# Patient Record
Sex: Female | Born: 1955 | Race: White | Hispanic: No | Marital: Married | Smoking: Former smoker
Health system: Southern US, Community
[De-identification: ages and names within clinical notes are randomized; demographics above are authoritative.]

## PROBLEM LIST (undated history)

## (undated) DIAGNOSIS — F329 Major depressive disorder, single episode, unspecified: Secondary | ICD-10-CM

## (undated) DIAGNOSIS — M899 Disorder of bone, unspecified: Secondary | ICD-10-CM

## (undated) DIAGNOSIS — M1990 Unspecified osteoarthritis, unspecified site: Secondary | ICD-10-CM

## (undated) DIAGNOSIS — J309 Allergic rhinitis, unspecified: Secondary | ICD-10-CM

## (undated) DIAGNOSIS — E119 Type 2 diabetes mellitus without complications: Secondary | ICD-10-CM

## (undated) DIAGNOSIS — I10 Essential (primary) hypertension: Secondary | ICD-10-CM

## (undated) DIAGNOSIS — K649 Unspecified hemorrhoids: Secondary | ICD-10-CM

## (undated) DIAGNOSIS — M949 Disorder of cartilage, unspecified: Secondary | ICD-10-CM

## (undated) DIAGNOSIS — F411 Generalized anxiety disorder: Secondary | ICD-10-CM

## (undated) DIAGNOSIS — G5600 Carpal tunnel syndrome, unspecified upper limb: Secondary | ICD-10-CM

## (undated) DIAGNOSIS — Z794 Long term (current) use of insulin: Secondary | ICD-10-CM

## (undated) DIAGNOSIS — Z87891 Personal history of nicotine dependence: Secondary | ICD-10-CM

## (undated) DIAGNOSIS — E785 Hyperlipidemia, unspecified: Secondary | ICD-10-CM

## (undated) DIAGNOSIS — A6000 Herpesviral infection of urogenital system, unspecified: Secondary | ICD-10-CM

## (undated) DIAGNOSIS — K648 Other hemorrhoids: Secondary | ICD-10-CM

## (undated) DIAGNOSIS — F3289 Other specified depressive episodes: Secondary | ICD-10-CM

## (undated) DIAGNOSIS — M810 Age-related osteoporosis without current pathological fracture: Secondary | ICD-10-CM

## (undated) DIAGNOSIS — Z85820 Personal history of malignant melanoma of skin: Secondary | ICD-10-CM

## (undated) HISTORY — DX: Type 2 diabetes mellitus without complications: E11.9

## (undated) HISTORY — DX: Essential (primary) hypertension: I10

## (undated) HISTORY — PX: OTHER SURGICAL HISTORY: SHX169

## (undated) HISTORY — DX: Disorder of cartilage, unspecified: M94.9

## (undated) HISTORY — DX: Allergic rhinitis, unspecified: J30.9

## (undated) HISTORY — DX: Other hemorrhoids: K64.8

## (undated) HISTORY — PX: MOHS SURGERY: SUR867

## (undated) HISTORY — PX: ANUS SURGERY: SHX302

## (undated) HISTORY — DX: Other specified depressive episodes: F32.89

## (undated) HISTORY — DX: Personal history of nicotine dependence: Z87.891

## (undated) HISTORY — DX: Major depressive disorder, single episode, unspecified: F32.9

## (undated) HISTORY — DX: Herpesviral infection of urogenital system, unspecified: A60.00

## (undated) HISTORY — DX: Generalized anxiety disorder: F41.1

## (undated) HISTORY — DX: Hyperlipidemia, unspecified: E78.5

## (undated) HISTORY — DX: Disorder of bone, unspecified: M89.9

---

## 1981-11-20 HISTORY — PX: LAPAROSCOPIC SALPINGO OOPHERECTOMY: SHX5927

## 1984-11-20 HISTORY — PX: OTHER SURGICAL HISTORY: SHX169

## 2000-10-12 ENCOUNTER — Encounter: Payer: Self-pay | Admitting: Internal Medicine

## 2001-09-24 ENCOUNTER — Encounter: Payer: Self-pay | Admitting: Gastroenterology

## 2001-09-24 DIAGNOSIS — K648 Other hemorrhoids: Secondary | ICD-10-CM

## 2001-09-24 HISTORY — DX: Other hemorrhoids: K64.8

## 2002-11-24 ENCOUNTER — Ambulatory Visit (HOSPITAL_COMMUNITY): Admission: RE | Admit: 2002-11-24 | Discharge: 2002-11-24 | Payer: Self-pay | Admitting: Family Medicine

## 2002-11-24 ENCOUNTER — Encounter: Payer: Self-pay | Admitting: Family Medicine

## 2003-01-15 ENCOUNTER — Ambulatory Visit (HOSPITAL_COMMUNITY): Admission: RE | Admit: 2003-01-15 | Discharge: 2003-01-15 | Payer: Self-pay | Admitting: Family Medicine

## 2003-01-15 ENCOUNTER — Encounter: Payer: Self-pay | Admitting: Family Medicine

## 2003-05-28 ENCOUNTER — Other Ambulatory Visit: Admission: RE | Admit: 2003-05-28 | Discharge: 2003-05-28 | Payer: Self-pay | Admitting: Obstetrics and Gynecology

## 2004-07-04 ENCOUNTER — Other Ambulatory Visit: Admission: RE | Admit: 2004-07-04 | Discharge: 2004-07-04 | Payer: Self-pay | Admitting: Obstetrics and Gynecology

## 2004-09-20 HISTORY — PX: OTHER SURGICAL HISTORY: SHX169

## 2004-09-21 ENCOUNTER — Encounter (INDEPENDENT_AMBULATORY_CARE_PROVIDER_SITE_OTHER): Payer: Self-pay | Admitting: *Deleted

## 2004-09-21 ENCOUNTER — Observation Stay (HOSPITAL_COMMUNITY): Admission: RE | Admit: 2004-09-21 | Discharge: 2004-09-21 | Payer: Self-pay | Admitting: Obstetrics and Gynecology

## 2004-09-21 HISTORY — PX: LAPAROSCOPIC SALPINGOOPHERECTOMY: SUR795

## 2005-05-27 ENCOUNTER — Inpatient Hospital Stay (HOSPITAL_COMMUNITY): Admission: AD | Admit: 2005-05-27 | Discharge: 2005-05-27 | Payer: Self-pay | Admitting: Obstetrics and Gynecology

## 2005-09-08 ENCOUNTER — Other Ambulatory Visit: Admission: RE | Admit: 2005-09-08 | Discharge: 2005-09-08 | Payer: Self-pay | Admitting: Obstetrics and Gynecology

## 2005-09-19 ENCOUNTER — Ambulatory Visit: Payer: Self-pay | Admitting: Gastroenterology

## 2008-10-23 ENCOUNTER — Telehealth: Payer: Self-pay | Admitting: Gastroenterology

## 2008-10-26 DIAGNOSIS — M949 Disorder of cartilage, unspecified: Secondary | ICD-10-CM

## 2008-10-26 DIAGNOSIS — M899 Disorder of bone, unspecified: Secondary | ICD-10-CM | POA: Insufficient documentation

## 2008-10-26 DIAGNOSIS — F419 Anxiety disorder, unspecified: Secondary | ICD-10-CM | POA: Insufficient documentation

## 2008-10-26 DIAGNOSIS — F411 Generalized anxiety disorder: Secondary | ICD-10-CM

## 2008-10-26 DIAGNOSIS — K648 Other hemorrhoids: Secondary | ICD-10-CM | POA: Insufficient documentation

## 2008-10-29 ENCOUNTER — Encounter: Payer: Self-pay | Admitting: Internal Medicine

## 2008-10-29 LAB — CONVERTED CEMR LAB
ALT: 24 units/L
ALT: 37 units/L
AST: 27 units/L
AST: 30 units/L
Albumin: 4.1 g/dL
Alkaline Phosphatase: 43 units/L
Alkaline Phosphatase: 71 units/L
BUN: 17 mg/dL
CO2: 24 meq/L
Calcium: 9.4 mg/dL
Chloride: 105 meq/L
Cholesterol: 151 mg/dL
Cholesterol: 177 mg/dL
Creatinine, Ser: 0.7 mg/dL
Glucose, Bld: 95 mg/dL
HDL: 37 mg/dL
HDL: 38 mg/dL
LDL Cholesterol: 122 mg/dL
LDL Cholesterol: 98 mg/dL
Potassium: 3.9 meq/L
Sodium: 14 meq/L
Total Bilirubin: 0.8 mg/dL
Total Protein: 6.9 g/dL
Triglyceride fasting, serum: 140 mg/dL
Triglyceride fasting, serum: 162 mg/dL

## 2008-11-20 DIAGNOSIS — Z85828 Personal history of other malignant neoplasm of skin: Secondary | ICD-10-CM

## 2008-11-20 HISTORY — DX: Personal history of other malignant neoplasm of skin: Z85.828

## 2008-12-07 ENCOUNTER — Telehealth: Payer: Self-pay | Admitting: Gastroenterology

## 2009-09-15 ENCOUNTER — Encounter: Payer: Self-pay | Admitting: Internal Medicine

## 2009-09-15 LAB — CONVERTED CEMR LAB
ALT: 33 units/L
AST: 31 units/L
Albumin: 4.1 g/dL
Alkaline Phosphatase: 62 units/L
BUN: 14 mg/dL
CO2: 26 meq/L
Calcium: 9.7 mg/dL
Chloride: 105 meq/L
Cholesterol: 156 mg/dL
Creatinine, Ser: 0.6 mg/dL
Glucose, Bld: 104 mg/dL
HDL: 39 mg/dL
LDL Cholesterol: 98 mg/dL
Potassium: 3.9 meq/L
Sodium: 140 meq/L
Total Bilirubin: 0.4 mg/dL
Triglyceride fasting, serum: 119 mg/dL

## 2010-02-10 ENCOUNTER — Encounter: Payer: Self-pay | Admitting: Internal Medicine

## 2010-03-16 ENCOUNTER — Encounter: Payer: Self-pay | Admitting: Internal Medicine

## 2010-03-16 LAB — CONVERTED CEMR LAB
ALT: 35 units/L
AST: 29 units/L
Albumin: 4.2 g/dL
Alkaline Phosphatase: 72 units/L
BUN: 15 mg/dL
CO2: 24 meq/L
Calcium: 9.5 mg/dL
Chloride: 102 meq/L
Cholesterol: 156 mg/dL
Creatinine, Ser: 0.5 mg/dL
Glucose, Bld: 168 mg/dL
HDL: 35 mg/dL
Hgb A1c MFr Bld: 7.7 %
LDL Cholesterol: 9 mg/dL
Potassium: 3.9 meq/L
Sodium: 136 meq/L
Total Bilirubin: 0.5 mg/dL
Triglyceride fasting, serum: 212 mg/dL

## 2010-05-18 ENCOUNTER — Ambulatory Visit: Payer: Self-pay | Admitting: Internal Medicine

## 2010-05-18 DIAGNOSIS — Z87891 Personal history of nicotine dependence: Secondary | ICD-10-CM | POA: Insufficient documentation

## 2010-05-18 DIAGNOSIS — J309 Allergic rhinitis, unspecified: Secondary | ICD-10-CM | POA: Insufficient documentation

## 2010-05-18 DIAGNOSIS — F32A Depression, unspecified: Secondary | ICD-10-CM | POA: Insufficient documentation

## 2010-05-18 DIAGNOSIS — Z9889 Other specified postprocedural states: Secondary | ICD-10-CM | POA: Insufficient documentation

## 2010-05-18 DIAGNOSIS — F329 Major depressive disorder, single episode, unspecified: Secondary | ICD-10-CM

## 2010-05-18 DIAGNOSIS — E785 Hyperlipidemia, unspecified: Secondary | ICD-10-CM | POA: Insufficient documentation

## 2010-05-18 DIAGNOSIS — I10 Essential (primary) hypertension: Secondary | ICD-10-CM | POA: Insufficient documentation

## 2010-05-26 ENCOUNTER — Telehealth (INDEPENDENT_AMBULATORY_CARE_PROVIDER_SITE_OTHER): Payer: Self-pay | Admitting: *Deleted

## 2010-05-31 ENCOUNTER — Encounter: Payer: Self-pay | Admitting: Internal Medicine

## 2010-06-17 ENCOUNTER — Telehealth: Payer: Self-pay | Admitting: Internal Medicine

## 2010-06-20 ENCOUNTER — Encounter (INDEPENDENT_AMBULATORY_CARE_PROVIDER_SITE_OTHER): Payer: Self-pay | Admitting: *Deleted

## 2010-07-13 ENCOUNTER — Telehealth (INDEPENDENT_AMBULATORY_CARE_PROVIDER_SITE_OTHER): Payer: Self-pay | Admitting: *Deleted

## 2010-07-14 ENCOUNTER — Telehealth: Payer: Self-pay | Admitting: Gastroenterology

## 2010-07-14 ENCOUNTER — Telehealth: Payer: Self-pay | Admitting: Physician Assistant

## 2010-08-25 ENCOUNTER — Ambulatory Visit: Payer: Self-pay | Admitting: Internal Medicine

## 2010-08-26 ENCOUNTER — Telehealth: Payer: Self-pay | Admitting: Internal Medicine

## 2010-08-26 LAB — CONVERTED CEMR LAB
ALT: 33 units/L (ref 0–35)
AST: 29 units/L (ref 0–37)
Albumin: 4.1 g/dL (ref 3.5–5.2)
Alkaline Phosphatase: 79 units/L (ref 39–117)
BUN: 17 mg/dL (ref 6–23)
CO2: 28 meq/L (ref 19–32)
Calcium: 9.7 mg/dL (ref 8.4–10.5)
Chloride: 99 meq/L (ref 96–112)
Cholesterol: 150 mg/dL (ref 0–200)
Creatinine, Ser: 0.6 mg/dL (ref 0.4–1.2)
Direct LDL: 85.5 mg/dL
GFR calc non Af Amer: 121.69 mL/min (ref >60)
Glucose, Bld: 183 mg/dL — ABNORMAL HIGH (ref 70–99)
HDL: 30.2 mg/dL — ABNORMAL LOW (ref >39.00)
Potassium: 3.9 meq/L (ref 3.5–5.1)
Sodium: 136 meq/L (ref 135–145)
Total Bilirubin: 0.5 mg/dL (ref 0.3–1.2)
Total CHOL/HDL Ratio: 5
Total Protein: 7.4 g/dL (ref 6.0–8.3)
Triglycerides: 227 mg/dL — ABNORMAL HIGH (ref 0.0–149.0)
VLDL: 45.4 mg/dL — ABNORMAL HIGH (ref 0.0–40.0)

## 2010-08-30 ENCOUNTER — Ambulatory Visit: Payer: Self-pay | Admitting: Internal Medicine

## 2010-08-30 DIAGNOSIS — E119 Type 2 diabetes mellitus without complications: Secondary | ICD-10-CM | POA: Insufficient documentation

## 2010-08-30 LAB — CONVERTED CEMR LAB
Creatinine,U: 86.3 mg/dL
Hgb A1c MFr Bld: 10 % — ABNORMAL HIGH (ref 4.6–6.5)
Microalb Creat Ratio: 0.3 mg/g (ref 0.0–30.0)
Microalb, Ur: 0.3 mg/dL (ref 0.0–1.9)

## 2010-08-31 ENCOUNTER — Telehealth: Payer: Self-pay | Admitting: Internal Medicine

## 2010-09-01 ENCOUNTER — Telehealth: Payer: Self-pay | Admitting: Internal Medicine

## 2010-09-05 ENCOUNTER — Telehealth: Payer: Self-pay | Admitting: Internal Medicine

## 2010-09-08 ENCOUNTER — Telehealth: Payer: Self-pay | Admitting: Internal Medicine

## 2010-09-27 ENCOUNTER — Encounter: Payer: Self-pay | Admitting: Internal Medicine

## 2010-10-10 ENCOUNTER — Ambulatory Visit: Payer: Self-pay | Admitting: Internal Medicine

## 2010-10-20 ENCOUNTER — Encounter: Payer: Self-pay | Admitting: Internal Medicine

## 2010-10-20 ENCOUNTER — Encounter
Admission: RE | Admit: 2010-10-20 | Discharge: 2010-12-20 | Payer: Self-pay | Source: Home / Self Care | Attending: Internal Medicine | Admitting: Internal Medicine

## 2010-10-24 ENCOUNTER — Encounter: Payer: Self-pay | Admitting: Internal Medicine

## 2010-10-25 ENCOUNTER — Telehealth: Payer: Self-pay | Admitting: Internal Medicine

## 2010-11-02 ENCOUNTER — Telehealth: Payer: Self-pay | Admitting: Internal Medicine

## 2010-11-10 ENCOUNTER — Encounter: Payer: Self-pay | Admitting: Internal Medicine

## 2010-11-10 LAB — HM MAMMOGRAPHY

## 2010-11-14 ENCOUNTER — Ambulatory Visit: Payer: Self-pay | Admitting: Internal Medicine

## 2010-11-15 LAB — CONVERTED CEMR LAB: Hgb A1c MFr Bld: 8.2 % — ABNORMAL HIGH (ref 4.6–6.5)

## 2010-11-18 ENCOUNTER — Ambulatory Visit: Payer: Self-pay | Admitting: Internal Medicine

## 2010-11-23 ENCOUNTER — Encounter: Payer: Self-pay | Admitting: Internal Medicine

## 2010-11-24 ENCOUNTER — Encounter: Payer: Self-pay | Admitting: Internal Medicine

## 2010-12-07 ENCOUNTER — Encounter: Payer: Self-pay | Admitting: Gastroenterology

## 2010-12-13 ENCOUNTER — Telehealth: Payer: Self-pay | Admitting: Internal Medicine

## 2010-12-22 NOTE — Assessment & Plan Note (Signed)
Summary: PER PT 3 MTH FU AND PT D/T INSTEAD OF JAN-STC   Vital Signs:  Patient profile:   55 year old female Height:      64.5 inches (163.83 cm) Weight:      221 pounds (100.45 kg) BMI:     37.48 O2 Sat:      97 % on Room air Temp:     97.7 degrees F (36.50 degrees C) oral Pulse rate:   85 / minute BP sitting:   102 / 64  (left arm) Cuff size:   large  Vitals Entered By: Margaret Pyle, CMA (November 18, 2010 1:25 PM)  O2 Flow:  Room air CC: F/U discuss weight Is Patient Diabetic? Yes   Primary Care Provider:  Rene Paci MD  CC:  F/U discuss weight.  History of Present Illness: here for f/u  1) dyslipidemia - reports compliance with ongoing medical treatment and no changes in medication dose or frequency. denies adverse side effects related to current therapy. no GI or Mskel problems-  2) DM2: started metformin 08/2010 for new dx ("borderline" prior), strong +FH same - s/p meeting with nutritionist through work program re: weight loss mgmt and diet control of same - home cbgs check 2x/d  3) HTN - reports compliance with ongoing medical treatment and no changes in medication dose or frequency. denies adverse side effects related to current therapy. no CP or edema or HA or vision changes  4) depression - hx same - well controlled symptoms on current tx - reports compliance with ongoing medical treatment and no changes in medication dose or frequency. denies adverse side effects related to current therapy.   Clinical Review Panels:  Lipid Management   Cholesterol:  150 (08/25/2010)   LDL (bad choesterol):  9 (16/08/9603)   HDL (good cholesterol):  30.20 (08/25/2010)   Triglycerides:  212 (03/16/2010)  Diabetes Management   HgBA1C:  8.2 (11/15/2010)   Creatinine:  0.6 (08/25/2010)   Last Dilated Eye Exam:  normal OD 20/25 and OS 20/20 (10/24/2010)   Last Flu Vaccine:  Historical (08/20/2009)  Complete Metabolic Panel   Glucose:  183  (08/25/2010)   Sodium:  136 (08/25/2010)   Potassium:  3.9 (08/25/2010)   Chloride:  99 (08/25/2010)   CO2:  28 (08/25/2010)   BUN:  17 (08/25/2010)   Creatinine:  0.6 (08/25/2010)   Albumin:  4.1 (08/25/2010)   Total Protein:  7.4 (08/25/2010)   Calcium:  9.7 (08/25/2010)   Total Bili:  0.5 (08/25/2010)   Alk Phos:  79 (08/25/2010)   SGPT (ALT):  33 (08/25/2010)   SGOT (AST):  29 (08/25/2010)   Current Medications (verified): 1)  Triamterene-Hctz 37.5-25 Mg Tabs (Triamterene-Hctz) .... Take 1 By Mouth Once Daily 2)  Zoloft 100 Mg Tabs (Sertraline Hcl) .... Take 1 By Mouth Once Daily 3)  Tricor 145 Mg Tabs (Fenofibrate) .... Take 1 By Mouth Once Daily 4)  Simvastatin 20 Mg Tabs (Simvastatin) .... Take 1 Po Once Daily 5)  Clonazepam 1 Mg Tabs (Clonazepam) .... Take 1-2 By Mouth Once Daily As Needed 6)  Omega-3 350 Mg Caps (Omega-3 Fatty Acids) .... Take 1 By Mouth Once Daily 7)  Caltrate 600+d 600-400 Mg-Unit Tabs (Calcium Carbonate-Vitamin D) .... Take 1 By Mouth Once Daily 8)  Metformin Hcl 500 Mg Xr24h-Tab (Metformin Hcl) .[name].. 1 By Mouth Two Times A Day 9)  Fluconazole 150 Mg Tabs (Fluconazole) .[name].. 1 By Mouth Once Daily X 3 Days, Repeat If Needed For  Yeast Symptoms 10)  Onetouch Ultra 2 W/device Kit (Blood Glucose Monitoring Suppl) 11)  Onetouch Ultra Blue  Strp (Glucose Blood) .... Use As Directed Three Times A Day 12)  Onetouch Delica Lancets  Misc (Lancets) .... Use As Directed Three Times A Day 13)  Colace 100 Mg Caps (Docusate Sodium) .... Once Daily 14)  Anusol-Hc 25 Mg Supp (Hydrocortisone Acetate) .... Insert 1 Suppository Into Rectum At Bedtime 15)  Citrucel 500 Mg Tabs (Methylcellulose (Laxative)) .... As Directed  Allergies (verified): 1)  ! Codeine  Past History:  Past Medical History: ANXIETY DEPRESSION OSTEOPENIA HEMORRHOIDS, INTERNAL  Allergic rhinitis Hyperlipidemia Hypertension diabetes mellitus type 2  MD roster:   GI - brodie gyn - tomblin derm  - gruber/gould  Review of Systems       The patient complains of weight gain.  The patient denies anorexia, weight loss, chest pain, headaches, and abdominal pain.    Physical Exam  General:  overweight-appearing.  alert, well-developed, well-nourished, and cooperative to examination.    Lungs:  normal respiratory effort, no intercostal retractions or use of accessory muscles; normal breath sounds bilaterally - no crackles and no wheezes.    Heart:  normal rate, regular rhythm, no murmur, and no rub. BLE without edema.    Impression & Recommendations:  Problem # 1:  DIABETES MELLITUS, TYPE II (ICD-250.00)  dx 08/2010 - started metformin for same - improved but not yet at goal - reviewed diet goals and need for exercise start victoza - pt educated on same - sample provided and erx done Her updated medication list for this problem includes:    Metformin Hcl 500 Mg Xr24h-tab (Metformin hcl) .[name]... 1 by mouth two times a day    Victoza 18 Mg/68ml Soln (Liraglutide) .[name]... 1.2mg  subcutaneously once daily  Labs Reviewed: Creat: 0.6 (08/25/2010)     Last Eye Exam: normal OD 20/25 and OS 20/20 (10/24/2010) Reviewed HgBA1c results: 8.2 (11/15/2010)  10.0 (08/30/2010)  Orders: Prescription Created Electronically [phone])  Complete Medication List: 1)  Triamterene-hctz 37.5-25 Mg Tabs (Triamterene-hctz) .... Take 1 by mouth once daily 2)  Zoloft 100 Mg Tabs (Sertraline hcl) .... Take 1 by mouth once daily 3)  Tricor 145 Mg Tabs (Fenofibrate) .... Take 1 by mouth once daily 4)  Simvastatin 20 Mg Tabs (Simvastatin) .... Take 1 po once daily 5)  Clonazepam 1 Mg Tabs (Clonazepam) .... Take 1-2 by mouth once daily as needed 6)  Omega-3 350 Mg Caps (Omega-3 fatty acids) .... Take 1 by mouth once daily 7)  Caltrate 600+d 600-400 Mg-unit Tabs (Calcium carbonate-vitamin d) .... Take 1 by mouth once daily 8)  Metformin Hcl 500 Mg Xr24h-tab (Metformin hcl) .[name].. 1 by mouth two times a day 9)   Fluconazole 150 Mg Tabs (Fluconazole) .[name].. 1 by mouth once daily x 3 days, repeat if needed for yeast symptoms 10)  Onetouch Ultra 2 W/device Kit (Blood glucose monitoring suppl) 11)  Onetouch Ultra Blue Strp (Glucose blood) .... Use as directed three times a day 12)  Onetouch Delica Lancets Misc (Lancets) .... Use as directed three times a day 13)  Colace 100 Mg Caps (Docusate sodium) .... Once daily 14)  Anusol-hc 25 Mg Supp (Hydrocortisone acetate) .... Insert 1 suppository into rectum at bedtime 15)  Citrucel 500 Mg Tabs (Methylcellulose (laxative)) .... As directed 16)  Victoza 18 Mg/35ml Soln (Liraglutide) .... 1.2mg  subcutaneously once daily 17)  Novotwist 32g X 5 Mm Misc (Insulin pen needle) .... Once daily with victoza  Patient  Instructions: 1)  it was good to see you today. 2)  a1c improved but not yet at goal--- 8.2, prev 10.0 08/2010 3)  start vicotza as discussed - sample and information starter pack give+ prescription has been electronically submitted to your pharmacy. Please take as directed. Contact our office if you believe you're having problems with the medication(s).  4)  continue metformin and other medications as ongoing, no other changes 5)  continue to work with Marylu Lund and the nutritionist as ongoing 6)  Please schedule a follow-up appointment in 3 months for labs, sooner if problems.  Prescriptions: NOVOTWIST 32G X 5 MM MISC (INSULIN PEN NEEDLE) once daily with victoza  #1 box x 3   Entered and Authorized by:   Newt Lukes MD   Signed by:   Newt Lukes MD on 11/18/2010   Method used:   Electronically to        [facility]* (retail)       [address]       [address] Shipping/mailing       [place], [place]  [address]       Ph: [phone]       Fax: [phone]   RxID:   [id number] SWFUXNA 18 MG/3ML SOLN (LIRAGLUTIDE) 1.2mg  subcutaneously once daily  #1 x 3   Entered and Authorized by:   Newt Lukes MD   Signed by:    Newt Lukes MD on 11/18/2010   Method used:   Electronically to        [facility]* (retail)       [address]       [address] Shipping/mailing       [place], [place]  [address]       Ph: [phone]       Fax: [phone]   RxID:   [id number]    Orders Added: 1)  Est. Patient Level IV [54627] 2)  Prescription Created Electronically [phone]

## 2010-12-22 NOTE — Miscellaneous (Signed)
Summary: formulary change to byetta  Clinical Lists Changes  Medications: Changed medication from VICTOZA 18 MG/3ML SOLN (LIRAGLUTIDE) 1.2mg  subcutaneously once daily to BYETTA 5 MCG PEN 5 MCG/0.02ML SOLN (EXENATIDE) subcutaneously two times a day BEFORE meals - Signed Changed medication from NOVOTWIST 32G X 5 MM MISC (INSULIN PEN NEEDLE) once daily with victoza to NOVOTWIST 32G X 5 MM MISC (INSULIN PEN NEEDLE) two times a day with byetta - Signed Rx of BYETTA 5 MCG PEN 5 MCG/0.02ML SOLN (EXENATIDE) subcutaneously two times a day BEFORE meals;  #1 mo x 3;  Signed;  Entered by: Newt Lukes MD;  Authorized by: Newt Lukes MD;  Method used: Electronically to [facility]*, [address] Shipping/mailing, [place], [place]  [address], Ph: [phone], Fax: [phone]    Prescriptions: BYETTA 5 MCG PEN 5 MCG/0.02ML SOLN (EXENATIDE) subcutaneously two times a day BEFORE meals  #1 mo x 3   Entered and Authorized by:   Newt Lukes MD   Signed by:   Newt Lukes MD on 11/24/2010   Method used:   Electronically to        [facility]* (retail)       [address]       [address] Shipping/mailing       [place], [place]  [address]       Ph: [phone]       Fax: [phone]   RxID:   [id number]  Pt advised via VM. Margaret Pyle, CMA  November 24, 2010 8:53 AM

## 2010-12-22 NOTE — Progress Notes (Signed)
Summary: FYI -a1c could not be added on labs  Phone Note From Pharmacy   Caller: Dennice/ lab ext [phone] Request: Speak with Nurse Details for Reason: FYI Summary of Call: Recieved add-on slip for A1c but pt did not have a lavender tube so they are not able to add on the A1C.  Initial call taken by: Orlan Leavens RMA,  August 26, 2010 4:35 PM  Follow-up for Phone Call        ok - will draw at [facility] next week - thanks Follow-up by: Newt Lukes MD,  August 26, 2010 5:02 PM

## 2010-12-22 NOTE — Progress Notes (Signed)
  Phone Note Refill Request Message from:  Patient on August 31, 2010 11:18 AM  Refills Requested: Medication #1:  CLONAZEPAM 1 MG TABS take 1-2 by mouth as needed   Notes: [facility]  Method Requested: Electronic Initial call taken by: Margaret Pyle, CMA,  August 31, 2010 11:19 AM  Follow-up for Phone Call        [place] controlled subst reviewed - pt filled #180 on 12/29/09 - ok to rx same (#180, no refills) - thanks Follow-up by: Newt Lukes MD,  August 31, 2010 11:34 AM    New/Updated Medications: CLONAZEPAM 1 MG TABS (CLONAZEPAM) take 1-2 by mouth once daily as needed Prescriptions: CLONAZEPAM 1 MG TABS (CLONAZEPAM) take 1-2 by mouth once daily as needed  #180 x 0   Entered and Authorized by:   Margaret Pyle, CMA   Signed by:   Margaret Pyle, CMA on 08/31/2010   Method used:   Telephoned to ...       [facility]* (retail)       [address]       [address] Shipping/mailing       [place], [place]  [address]       Ph: [phone]       Fax: [phone]   RxID:   [id number] CLONAZEPAM 1 MG TABS (CLONAZEPAM) take 1-2 by mouth once daily as needed  #180 x 0   Entered and Authorized by:   Newt Lukes MD   Signed by:   Newt Lukes MD on 08/31/2010   Method used:   Historical   RxID:   [id number]

## 2010-12-22 NOTE — Letter (Signed)
Summary: Colonoscopy Letter  [facility]  [address] [place], [place] [address]   Phone: [phone]  Fax: [phone]      December 07, 2010 MRN: [id number]   Destiny Tucker [address] [place], [place]  [address]   Dear Ms. Destiny Tucker,   According to your medical record, it is time for you to schedule a Colonoscopy. The American Cancer Society recommends this procedure as a method to detect early colon cancer. Patients with a family history of colon cancer, or a personal history of colon polyps or inflammatory bowel disease are at increased risk.  This letter has been generated based on the recommendations made at the time of your procedure. If you feel that in your particular situation this may no longer apply, please contact our office.  Please call our office at [phone] to schedule this appointment or to update your records at your earliest convenience.  Thank you for cooperating with [place] to provide you with the very best care possible.   Sincerely,  Judie Petit T. Russella Dar, M.D.  [facility] [phone]

## 2010-12-22 NOTE — Progress Notes (Signed)
Summary: CBG  Phone Note Call from Patient Call back at Home Phone [phone]   Caller: Patient Summary of Call: Pt called stating that she checked her CBGs and is was at 61. Pt denies and sxs but has eaten a banana. Pt wants to be advise of ranges, extreme highs and lows. Please advise. Initial call taken by: Margaret Pyle, CMA,  September 05, 2010 4:44 PM  Follow-up for Phone Call        Per MD: Range 70-120 call if over 150 if under 60. Pt informed via VM Follow-up by: Margaret Pyle, CMA,  September 05, 2010 4:53 PM

## 2010-12-22 NOTE — Progress Notes (Signed)
Summary: Sooner appt  Phone Note From Other Clinic   Caller: Stanton Kidney [phone]  @ Dr Felicity Coyer Call For: Dr Russella Dar Reason for Call: Schedule Patient Appt Summary of Call: Recurring hemorroids feels that she can't wait until appt on 08-17-10. Would like to be worked in with PA sooner. Initial call taken by: Leanor Kail [facility],  July 14, 2010 8:23 AM  Follow-up for Phone Call        patient scheduled for today to see Mike Gip PA at 2:00 Follow-up by: Darcey Nora RN, CGRN,  July 14, 2010 8:36 AM

## 2010-12-22 NOTE — Letter (Signed)
Summary: [facility]  [facility]   Imported By: Lester Gardner 06/03/2010 08:17:44  _____________________________________________________________________  External Attachment:    Type:   Image     Comment:   External Document

## 2010-12-22 NOTE — Progress Notes (Signed)
Summary: Rx req  Phone Note Refill Request Message from:  Patient on September 05, 2010 11:08 AM     New/Updated Medications: ONETOUCH ULTRA BLUE  STRP (GLUCOSE BLOOD) use as directed three times a day ONETOUCH DELICA LANCETS  MISC (LANCETS) use as directed three times a day Prescriptions: ONETOUCH DELICA LANCETS  MISC (LANCETS) use as directed three times a day  #300 x 3   Entered and Authorized by:   Margaret Pyle, CMA   Signed by:   Margaret Pyle, CMA on 09/05/2010   Method used:   Telephoned to ...       [facility]* (retail)       [address]       [address] Shipping/mailing       [place], [place]  [address]       Ph: [phone]       Fax: [phone]   RxID:   [id number] ONETOUCH ULTRA BLUE  STRP (GLUCOSE BLOOD) use as directed three times a day  #300 x 3   Entered and Authorized by:   Margaret Pyle, CMA   Signed by:   Margaret Pyle, CMA on 09/05/2010   Method used:   Telephoned to ...       [facility]* (retail)       [address]       [address] Shipping/mailing       [place], [place]  [address]       Ph: [phone]       Fax: [phone]   RxID:   [id number]

## 2010-12-22 NOTE — Letter (Signed)
Summary: [facility]  [facility]   Imported By: Sherian Rein 10/27/2010 09:40:03  _____________________________________________________________________  External Attachment:    Type:   Image     Comment:   External Document

## 2010-12-22 NOTE — Progress Notes (Signed)
  Phone Note Call from Patient   Summary of Call: Patient left vm. She has scheduled return visit for end of December b/c she needed to get office visit in before the end of the year. She was told to leave a vm on triage to make sure this was ok. I left pt vm that december apt was ok to keep.  Initial call taken by: Lamar Sprinkles, CMA,  September 08, 2010 4:23 PM

## 2010-12-22 NOTE — Progress Notes (Signed)
Summary: Destiny Tucker  Phone Note Call from Patient   Caller: Patient Call For: Amy Esterwood Summary of Call: Pt called back, said she cannot make appt this afternoon, wants another day soon.  Thanks Initial call taken by: Misty Stanley,  July 14, 2010 10:41 AM  Follow-up for Phone Call        patient is rescheduled for an appointment with Dr Russella Dar for Monday at 11:00 Follow-up by: Darcey Nora RN, CGRN,  July 14, 2010 11:51 AM     Appended Document: Destiny Tucker Patient  has actually requested a switch from Dr Russella Dar to Dr Juanda Chance in January, so patient has been rescheduled for 07/29/10 10:45 with Dr Juanda Chance.  Patient  has been offered appointments with the extenders for today, Fri, or Tuesday of next week.  She has declined those appts.

## 2010-12-22 NOTE — Progress Notes (Signed)
Summary: Medication  Phone Note Call from Patient Call back at Home Phone [phone]   Caller: Patient Call For: Dr. Juanda Chance Reason for Call: Talk to Nurse Summary of Call: Needs her script for [facility] 25 MG SUPP resent to [facility] pharmacy Initial call taken by: Karna Christmas,  October 25, 2010 3:53 PM  Follow-up for Phone Call        prescription resent. Follow-up by: Lamona Curl CMA Duncan Dull),  October 25, 2010 3:56 PM    Prescriptions: ANUSOL-HC 25 MG SUPP (HYDROCORTISONE ACETATE) Insert 1 suppository into rectum at bedtime  #12 x 1   Entered by:   Lamona Curl CMA (AAMA)   Authorized by:   Hart Carwin MD   Signed by:   Lamona Curl CMA (AAMA) on 10/25/2010   Method used:   Electronically to        [facility]* (retail)       [address]       [address] Shipping/mailing       [place], [place]  [address]       Ph: [phone]       Fax: [phone]   RxID:   [id number]

## 2010-12-22 NOTE — Assessment & Plan Note (Signed)
Summary: hemorroids--ch.   History of Present Illness Visit Type: Initial Consult Primary GI MD: Lina Sar MD Primary Provider: Rene Paci MD Requesting Provider: Rene Paci, MD Chief Complaint: hemmorhoids/ fissure, constipation x 1 year History of Present Illness:   This is a 55 year old white female with rectal irritation, incomplete evacuation and lower abdominal discomfort. Her symptoms seem to improve when she eats fruits and vegetables and stays on a high fiber diet. We  did a colonoscopy in 2002 which showed internal hemorrhoids. She had Hemoccult-positive stools, acutually 2 out of 3cards  at that time. She was evaluated for constipation in 2006. At her job, she sits at the computer and does not get much exercise. She has tried preparation H. and over-the-counter  creams as well as rectal suppositories she denies rectal bleeding.   GI Review of Systems    Reports abdominal pain, belching, and  bloating.     Location of  Abdominal pain: lower abdomen.    Denies acid reflux, chest pain, dysphagia with liquids, dysphagia with solids, heartburn, loss of appetite, nausea, vomiting, vomiting blood, weight loss, and  weight gain.      Reports change in bowel habits, constipation, hemorrhoids, and  rectal pain.     Denies anal fissure, black tarry stools, diarrhea, diverticulosis, fecal incontinence, heme positive stool, irritable bowel syndrome, jaundice, light color stool, liver problems, and  rectal bleeding.    Current Medications (verified): 1)  Triamterene-Hctz 37.5-25 Mg Tabs (Triamterene-Hctz) .... Take 1 By Mouth Once Daily 2)  Zoloft 100 Mg Tabs (Sertraline Hcl) .... Take 1 By Mouth Once Daily 3)  Tricor 145 Mg Tabs (Fenofibrate) .... Take 1 By Mouth Once Daily 4)  Simvastatin 20 Mg Tabs (Simvastatin) .... Take 1 Po Once Daily 5)  Clonazepam 1 Mg Tabs (Clonazepam) .... Take 1-2 By Mouth Once Daily As Needed 6)  Omega-3 350 Mg Caps (Omega-3 Fatty Acids) .... Take 1  By Mouth Once Daily 7)  Caltrate 600+d 600-400 Mg-Unit Tabs (Calcium Carbonate-Vitamin D) .... Take 1 By Mouth Once Daily 8)  Metformin Hcl 500 Mg Xr24h-Tab (Metformin Hcl) .[name].. 1 By Mouth Two Times A Day 9)  Fluconazole 150 Mg Tabs (Fluconazole) .[name].. 1 By Mouth Once Daily X 3 Days, Repeat If Needed For Yeast Symptoms 10)  Onetouch Ultra 2 W/device Kit (Blood Glucose Monitoring Suppl) 11)  Onetouch Ultra Blue  Strp (Glucose Blood) .... Use As Directed Three Times A Day 12)  Onetouch Delica Lancets  Misc (Lancets) .... Use As Directed Three Times A Day 13)  Colace 100 Mg Caps (Docusate Sodium) .... Once Daily  Allergies (verified): 1)  ! Codeine  Past History:  Past Medical History: Reviewed history from 08/30/2010 and no changes required. ANXIETY DEPRESSION OSTEOPENIA HEMORRHOIDS, INTERNAL  Allergic rhinitis Hyperlipidemia Hypertension diabetes mellitus type 2  MD roster:   GI - stark (?Lucresha Dismuke) gyn - tomblin derm - gruber/gould  Past Surgical History: Unilateral salpingo-oophorectomy Mohs Surgery for precancerous skin lesion Removal of fallopion tube & ovary (1986) Removal of cyst & other ovary 09/2004  Anal surgery Hysterectomy  Family History: Reviewed history from 05/18/2010 and no changes required. No FH of Colon Cancer: Family History of Diabetes: Maternal Uncles Family History of Heart Disease:  Family History High cholesterol (parent) Family History Hypertension (parent) Kidnet stones (both parent) Mother has alzheimers    Social History: Reviewed history from 05/18/2010 and no changes required. 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  The patient denies  anemia, anxiety-new, back pain, blood in urine, breast changes/lumps, change in vision, confusion, coughing up blood, depression-new, fainting, fatigue, fever, headaches-new, hearing problems, heart murmur, heart rhythm changes, menstrual pain, muscle pains/cramps, nosebleeds, pregnancy symptoms, shortness of breath, skin rash, sleeping problems, sore throat, swollen lymph glands, thirst - excessive, urination - excessive, urination changes/pain, vision changes, and voice change.         Pertinent positive and negative review of systems were noted in the above HPI. All other ROS was otherwise negative.   Vital Signs:  Patient profile:   55 year old female Height:      64.5 inches Weight:      216.25 pounds BMI:     36.68 Pulse rate:   68 / minute Pulse rhythm:   regular BP sitting:   110 / 68  (left arm) Cuff size:   regular  Vitals Entered By: June McMurray CMA Duncan Dull) (October 10, 2010 1:38 PM)  Physical Exam  General:  Well developed, well nourished, no acute distress. Mouth:  No deformity or lesions, dentition normal. Neck:  Supple; no masses or thyromegaly. Lungs:  Clear throughout to auscultation. Heart:  Regular rate and rhythm; no murmurs, rubs,  or bruits. Abdomen:  obese soft abdomen with normoactive bowel sounds. No tenderness or mass. Lower abdomen unremarkable. Liver edge at costal margin. Rectal:  rectal and anoscopic exam reveals an external hemorrhoidal tag. Normal rectal sphincter tone with mild discomfort on insertion of the anoscope. Small internal hemorrhoids. No prolapse. Stool is Hemoccult positive. Digital exam does not reveal any irregularity of the mucosa. Extremities:  No clubbing, cyanosis, edema or deformities noted. Skin:  Intact without significant lesions or rashes. Psych:  Alert and cooperative. Normal mood and affect.   Impression & Recommendations:  Problem # 1:  HEMORRHOIDS, INTERNAL (ICD-455.0) Patient has symptomatic external and internal  hemorrhoids without prolapse. Some of her symptoms suggest irritable bowel syndrome. I have advised a high-fiber diet with fiber supplements daily and have given her an outline of a high-fiber diet. She will take a probiotic daily and Benefiber or  Metamucil, one heaping teaspoon daily. She needs will need a colonoscopy because of heme-positive stool.  Patient Instructions: 1)  Please pick up your prescriptions at the pharmacy. Electronic prescription(s) has already been sent for Anusol Suppositories. 2)  We have given you some Align samples which you should take 1 capsule daily of. If it works well for you, it can be purchased over the counter. 3)  Please take Metamucil 1 teaspoon dissolved in water/juice once daily. 4)  You will be due for a recall colonoscopy in January 2012. We will send you a reminder in the mail. 5)  High Fiber Healthy Eating Plan brochure given.  6)  Copy sent to : Dr Willey Blade 7)  The medication list was reviewed and reconciled.  All changed / newly prescribed medications were explained.  A complete medication list was provided to the patient / caregiver. Prescriptions: ANUSOL-HC 25 MG SUPP (HYDROCORTISONE ACETATE) Insert 1 suppository into rectum at bedtime  #12 x 1   Entered by:   Lamona Curl CMA (AAMA)   Authorized by:   Hart Carwin MD   Signed by:   Lamona Curl CMA (AAMA) on 10/10/2010  Method used:   Electronically to        [facility]* (retail)       [address]       [address] Shipping/mailing       [place], [place]  [address]       Ph: [phone]       Fax: [phone]   RxID:   [id number]

## 2010-12-22 NOTE — Progress Notes (Signed)
Summary: med change  Phone Note From Pharmacy   Caller: [facility]* Reason for Call: Medication not on formulary Request: Speak with Provider Summary of Call: Recieved fax stating pt has rx for Tricor 145mg . This is not covered under her insurance. can we switch to fenofibrate 134mg ? # 90. Pt can get fenofibrate $0 at our pharmacy. (other strength 54mg ,67mg ,160mg ,200mg  all $0 copay). Pls advise Initial call taken by: Orlan Leavens RMA,  December 13, 2010 9:22 AM  Follow-up for Phone Call        yes, ok to change - erx done Follow-up by: Newt Lukes MD,  December 13, 2010 12:11 PM  Additional Follow-up for Phone Call Additional follow up Details #1::        Notified pharmacy Additional Follow-up by: Orlan Leavens RMA,  December 13, 2010 2:28 PM    New/Updated Medications: FENOFIBRATE MICRONIZED 134 MG CAPS (FENOFIBRATE MICRONIZED) 1 by mouth once daily Prescriptions: FENOFIBRATE MICRONIZED 134 MG CAPS (FENOFIBRATE MICRONIZED) 1 by mouth once daily  #90 x 1   Entered and Authorized by:   Newt Lukes MD   Signed by:   Newt Lukes MD on 12/13/2010   Method used:   Electronically to        [facility]* (retail)       [address]       [address] Shipping/mailing       [place], [place]  [address]       Ph: [phone]       Fax: [phone]   RxID:   [id number]

## 2010-12-22 NOTE — Assessment & Plan Note (Signed)
Summary: PER PT OCT APPT---STC   Vital Signs:  Patient profile:   55 year old female Height:      64.5 inches (163.83 cm) Weight:      222.0 pounds (100.91 kg) O2 Sat:      94 % on Room air Temp:     98.4 degrees F (36.89 degrees C) oral Pulse rate:   92 / minute BP sitting:   110 / 72  (left arm) Cuff size:   large  Vitals Entered By: Orlan Leavens RMA (August 30, 2010 1:10 PM)  O2 Flow:  Room air CC: 4 month follow-up Is Patient Diabetic? Yes Did you bring your meter with you today? No Pain Assessment Patient in pain? no        Primary Care Provider:  Rene Paci MD  CC:  4 month follow-up.  History of Present Illness: here for f/u  1) dyslipidemia - reports compliance with ongoing medical treatment and no changes in medication dose or frequency. denies adverse side effects related to current therapy. no GI or Mskel problems-  2) hemorrhoids - hx same - pain before each BM - improved with tucks but will not go away - no bleeding but painful to sit - has seen GI for same before - ?med  3) DM2: never yet on tx for same ("borderline" until now), strong +FH same - planning to meet with nutritionist through work program re: weight loss mgmt and diet control of same but has not done so yet - does not check sugars at home-  4) HTN - reports compliance with ongoing medical treatment and no changes in medication dose or frequency. denies adverse side effects related to current therapy. no CP or edema or HA or vision changes  5) depression - hx same - well controlled symptoms on current tx - reports compliance with ongoing medical treatment and no changes in medication dose or frequency. denies adverse side effects related to current therapy.   Clinical Review Panels:  Lipid Management   Cholesterol:  150 (08/25/2010)   LDL (bad choesterol):  9 (16/08/9603)   HDL (good cholesterol):  30.20 (08/25/2010)   Triglycerides:  212 (03/16/2010)  Diabetes Management   HgBA1C:   7.7 (03/16/2010)   Creatinine:  0.6 (08/25/2010)   Last Flu Vaccine:  Historical (08/20/2009)   Current Medications (verified): 1)  Triamterene-Hctz 37.5-25 Mg Tabs (Triamterene-Hctz) .... Take 1 By Mouth Once Daily 2)  Zoloft 100 Mg Tabs (Sertraline Hcl) .... Take 1 By Mouth Once Daily 3)  Tricor 145 Mg Tabs (Fenofibrate) .... Take 1 By Mouth Once Daily 4)  Simvastatin 20 Mg Tabs (Simvastatin) .... Take 1 Po Once Daily 5)  Clonazepam 1 Mg Tabs (Clonazepam) .... Take 1-2 By Mouth As Needed 6)  Omega-3 350 Mg Caps (Omega-3 Fatty Acids) .... Take 1 By Mouth Once Daily 7)  Caltrate 600+d 600-400 Mg-Unit Tabs (Calcium Carbonate-Vitamin D) .... Take 1 By Mouth Once Daily  Allergies (verified): 1)  ! Codeine  Past History:  Past Medical History: ANXIETY DEPRESSION OSTEOPENIA HEMORRHOIDS, INTERNAL  Allergic rhinitis Hyperlipidemia Hypertension diabetes mellitus type 2  MD roster:   GI - stark (?brodie) gyn - tomblin derm - gruber/gould  Review of Systems  The patient denies anorexia, weight loss, chest pain, syncope, and headaches.    Physical Exam  General:  overweight-appearing.  alert, well-developed, well-nourished, and cooperative to examination.    Lungs:  normal respiratory effort, no intercostal retractions or use of accessory muscles; normal breath sounds  bilaterally - no crackles and no wheezes.    Heart:  normal rate, regular rhythm, no murmur, and no rub. BLE without edema.  Psych:  Oriented X3, memory intact for recent and remote, normally interactive, good eye contact, not anxious appearing, not depressed appearing, and not agitated.      Impression & Recommendations:  Problem # 1:  DIABETES MELLITUS, TYPE II (ICD-250.00)  draw a1c now -  start metformin and monitor a1c every 3 months Labs Reviewed: Creat: 0.6 (08/25/2010)    Reviewed HgBA1c results: 7.7 (03/16/2010)  Her updated medication list for this problem includes:    Metformin Hcl 500 Mg  Xr24h-tab (Metformin hcl) .[name]... 1 by mouth two times a day  Orders: Prescription Created Electronically [id number]) TLB-A1C / Hgb A1C (Glycohemoglobin) (83036-A1C) TLB-Microalbumin/Creat Ratio, Urine (82043-MALB)  Problem # 2:  HYPERTENSION (ICD-401.9)  Her updated medication list for this problem includes:    Triamterene-hctz 37.5-25 Mg Tabs (Triamterene-hctz) .[name]... Take 1 by mouth once daily  BP today: 110/72 Prior BP: 118/84 (05/18/2010)  Labs Reviewed: K+: 3.9 (08/25/2010) Creat: : 0.6 (08/25/2010)   Chol: 150 (08/25/2010)   HDL: 30.20 (08/25/2010)   LDL: 9 (03/16/2010)   TG: 604.5 (08/25/2010)  Problem # 3:  HYPERLIPIDEMIA (ICD-272.4)  Her updated medication list for this problem includes:    Tricor 145 Mg Tabs (Fenofibrate) .[name]... Take 1 by mouth once daily    Simvastatin 20 Mg Tabs (Simvastatin) .[name]... Take 1 po once daily  Labs Reviewed: SGOT: 29 (08/25/2010)   SGPT: 33 (08/25/2010)   HDL:30.20 (08/25/2010), 35 (03/16/2010)  LDL:9 (03/16/2010), 98 (09/15/2009)  Chol:150 (08/25/2010), 156 (03/16/2010)  Trig:227.0 (08/25/2010), 212 (03/16/2010)  Complete Medication List: 1)  Triamterene-hctz 37.5-25 Mg Tabs (Triamterene-hctz) .... Take 1 by mouth once daily 2)  Zoloft 100 Mg Tabs (Sertraline hcl) .... Take 1 by mouth once daily 3)  Tricor 145 Mg Tabs (Fenofibrate) .... Take 1 by mouth once daily 4)  Simvastatin 20 Mg Tabs (Simvastatin) .... Take 1 po once daily 5)  Clonazepam 1 Mg Tabs (Clonazepam) .... Take 1-2 by mouth as needed 6)  Omega-3 350 Mg Caps (Omega-3 fatty acids) .... Take 1 by mouth once daily 7)  Caltrate 600+d 600-400 Mg-unit Tabs (Calcium carbonate-vitamin d) .... Take 1 by mouth once daily 8)  Metformin Hcl 500 Mg Xr24h-tab (Metformin hcl) .[name].. 1 by mouth two times a day 9)  Fluconazole 150 Mg Tabs (Fluconazole) .[name].. 1 by mouth once daily x 3 days, repeat if needed for yeast symptoms  Patient Instructions: 1)  it was good to see you today. 2)  labs reviewed  - also a1c ordered today - your results will be posted on the phone tree for review in 48-72 hours from the time of test completion; call [phone] and enter your 9 digit MRN (listed above on this page, just below your name); if any changes need to be made or there are abnormal results, you will be contacted directly.  3)  start metformin as discussed for diabetes  and diflucan for yeast - your prescriptions have been electronically submitted to your pharmacy. Please take as directed. Contact our office if you believe you're having problems with the medication(s).  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  Prescriptions: SIMVASTATIN 20 MG TABS (SIMVASTATIN) take 1 po once daily  #30 x 6   Entered and  Authorized by:   Newt Lukes MD   Signed by:   Newt Lukes MD on 08/30/2010   Method used:   Electronically to        [facility]* (retail)       [address]       [address] Shipping/mailing       [place], [place]  [address]       Ph: [phone]       Fax: [phone]   RxID:   [id number] TRICOR 145 MG TABS (FENOFIBRATE) take 1 by mouth once daily  #30 x 6   Entered and Authorized by:   Newt Lukes MD   Signed by:   Newt Lukes MD on 08/30/2010   Method used:   Electronically to        [facility]* (retail)       [address]       [address] Shipping/mailing       [place], [place]  [address]       Ph: [phone]       Fax: [phone]   RxID:   [id number] ZOLOFT 100 MG TABS (SERTRALINE HCL) take 1 by mouth once daily  #30 x 6   Entered and Authorized by:   Newt Lukes MD   Signed by:   Newt Lukes MD on 08/30/2010   Method used:   Electronically to        [facility]* (retail)       [address]        [address] Shipping/mailing       [place], [place]  [address]       Ph: [phone]       Fax: [phone]   RxID:   [id number] TRIAMTERENE-HCTZ 37.5-25 MG TABS (TRIAMTERENE-HCTZ) take 1 by mouth once daily  #30 x 6   Entered and Authorized by:   Newt Lukes MD   Signed by:   Newt Lukes MD on 08/30/2010   Method used:   Electronically to        [facility]* (retail)       [address]       [address] Shipping/mailing       [place], [place]  [address]       Ph: [phone]       Fax: [phone]   RxID:   [id number] FLUCONAZOLE 150 MG TABS (FLUCONAZOLE) 1 by mouth once daily x 3 days, repeat if needed for yeast symptoms  #6 x 0   Entered and Authorized by:   Newt Lukes MD   Signed by:   Newt Lukes MD on 08/30/2010   Method used:   Electronically to        [facility]* (retail)       [address]       [address] Shipping/mailing       [place], [place]  [address]       Ph: [phone]       Fax: [phone]   RxID:   [id number] METFORMIN HCL 500 MG XR24H-TAB (METFORMIN HCL) 1 by mouth two times a day  #60 x 6   Entered and Authorized by:   Newt Lukes MD   Signed by:   Newt Lukes MD on 08/30/2010   Method used:   Electronically  to        [facility]* (retail)       [address]       [address] Shipping/mailing       [place], [place]  [address]       Ph: [phone]       Fax: [phone]   RxID:   [id number]

## 2010-12-22 NOTE — Miscellaneous (Signed)
Summary: [facility]  [facility]   Imported By: Lester [place] 06/03/2010 08:08:10  _____________________________________________________________________  External Attachment:    Type:   Image     Comment:   External Document

## 2010-12-22 NOTE — Progress Notes (Signed)
Summary: Referral  Phone Note Call from Patient Call back at Home Phone [phone]   Caller: Patient Summary of Call: Pt called requesting referral to GI Juanda Chance or Russella Dar) for recurrent hemorrhoids.  Initial call taken by: Margaret Pyle, CMA,  June 17, 2010 2:43 PM  Follow-up for Phone Call        ok - done Follow-up by: Newt Lukes MD,  June 20, 2010 8:23 AM  Additional Follow-up for Phone Call Additional follow up Details #1::        Pt advised via home VM Additional Follow-up by: Margaret Pyle, CMA,  June 20, 2010 9:04 AM

## 2010-12-22 NOTE — Progress Notes (Signed)
Summary: Labs  Phone Note Call from Patient   Caller: Patient [phone] Summary of Call: Pt called requesting to have labs done prior to appt 12/30. Okay to add? Initial call taken by: Margaret Pyle, CMA,  November 02, 2010 8:54 AM  Follow-up for Phone Call        ok - a1c (250.00) Follow-up by: Newt Lukes MD,  November 02, 2010 9:09 AM  Additional Follow-up for Phone Call Additional follow up Details #1::        Labs scheduled, pt advised Additional Follow-up by: Margaret Pyle, CMA,  November 02, 2010 9:56 AM

## 2010-12-22 NOTE — Letter (Signed)
Summary: [facility]  [facility]   Imported By: Lester [place] 06/03/2010 08:16:12  _____________________________________________________________________  External Attachment:    Type:   Image     Comment:   External Document

## 2010-12-22 NOTE — Medication Information (Signed)
Summary: [facility]  [facility]   Imported By: Lester Harding 11/30/2010 10:11:04  _____________________________________________________________________  External Attachment:    Type:   Image     Comment:   External Document

## 2010-12-22 NOTE — Progress Notes (Signed)
  Phone Note Other Incoming   Request: Send information Summary of Call: Records received from [facility]. 42 pages forwarded to Dr. Felicity Coyer for review.

## 2010-12-22 NOTE — Assessment & Plan Note (Signed)
Summary: NEW /  [facility] / NWS   Vital Signs:  Patient profile:   55 year old female Height:      64.5 inches (163.83 cm) Weight:      222.0 pounds (100.91 kg) BMI:     37.65 O2 Sat:      97 % on Room air Temp:     98.8 degrees F (37.11 degrees C) oral Pulse rate:   92 / minute BP sitting:   118 / 84  (left arm) Cuff size:   large  Vitals Entered By: Orlan Leavens (May 18, 2010 2:02 PM)  O2 Flow:  Room air CC: New patient Is Patient Diabetic? No Pain Assessment Patient in pain? no        Primary Care Provider:  Rene Paci MD  CC:  New patient.  History of Present Illness: new pt to me and our division, here to est care prev followed with NP at [facility]  1) dyslipidemia - reports compliance with ongoing medical treatment and no changes in medication dose or frequency. denies adverse side effects related to current therapy. no GI or Mskel problems-  2) hemorrhoids - hx same - pain before each BM - improved with tucks but will not go away - no bleeding but painful to sit - has seen GI for same before - ?med  3) borderline DM - +FH same - planning to meet with nutritionist through work program re: weight loss mgmt and diet control of same -  4) HTN - reports compliance with ongoing medical treatment and no changes in medication dose or frequency. denies adverse side effects related to current therapy. no CP or edema or HA or vision changes  5) depression - hx same - well controlled symptoms on current tx - reports compliance with ongoing medical treatment and no changes in medication dose or frequency. denies adverse side effects related to current therapy.   Preventive Screening-Counseling & Management  Alcohol-Tobacco     Alcohol drinks/day: 0     Alcohol Counseling: not indicated; patient does not drink     Smoking Status: quit     Year Quit: 02/2010     Tobacco Counseling: not to resume use of tobacco products  Caffeine-Diet-Exercise     Diet Counseling: to improve  diet; diet is suboptimal     Nutrition Referrals: yes     Does Patient Exercise: no     Exercise Counseling: to improve exercise regimen     Depression Counseling: not indicated; screening negative for depression  Safety-Violence-Falls     Seat Belt Counseling: not indicated; patient wears seat belts     Helmet Counseling: not indicated; patient wears helmet when riding bicycle/motocycle     Firearms in the Home: firearms in the home     Firearm Counseling: not indicated; uses recommended firearm safety measures     Smoke Detectors: yes     Smoke Detector Counseling: no     Violence in the Home: no risk noted     Violence Counseling: not indicated; no violence risk noted     Fall Risk Counseling: not indicated; no significant falls noted  Clinical Review Panels:  Prevention   Last Colonoscopy:  Location:  [facility].  (09/24/2001)  Immunizations   Last Flu Vaccine:  Historical (08/20/2009)   Current Medications (verified): 1)  Triamterene-Hctz 37.5-25 Mg Tabs (Triamterene-Hctz) .... Take 1 By Mouth Once Daily 2)  Zoloft 100 Mg Tabs (Sertraline Hcl) .... Take 1 By Mouth Once Daily  3)  Tricor 145 Mg Tabs (Fenofibrate) .... Take 1 By Mouth Once Daily 4)  Simvastatin 20 Mg Tabs (Simvastatin) .... Take 1 Po Once Daily 5)  Clonazepam 1 Mg Tabs (Clonazepam) .... Take 1-2 By Mouth As Needed 6)  Omega-3 350 Mg Caps (Omega-3 Fatty Acids) .... Take 1 By Mouth Once Daily 7)  Caltrate 600+d 600-400 Mg-Unit Tabs (Calcium Carbonate-Vitamin D) .... Take 1 By Mouth Once Daily 8)  Cultrelle (Probiotic) .... Take 1 By Mouth Once Daily  Allergies (verified): 1)  ! Codeine  Past History:  Past Medical History: ANXIETY DEPRESSION OSTEOPENIA HEMORRHOIDS, INTERNAL  Allergic rhinitis Hyperlipidemia Hypertension  MD roster: GI - stark (?brodie) gyn - tomblin derm - gruber/gould  Past Surgical History: Unilateral salpingo-oophorectomy Mohs Surgery for precancerous skin  lesion Removal of fallopion tube & ovary (1986) Removal of cyst & other ovary 09/2004   Family History: No FH of Colon Cancer: Family History of Diabetes: Maternal Uncles Family History of Heart Disease:  Family History High cholesterol (parent) Family History Hypertension (parent) Kidnet stones (both parent) Mother has alzheimers    Social History: Alcohol Use - yes-occasional Illicit Drug Use - no Former Smoker- quit 02/2010 with chantix married, lives with spouse - 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 I have reviewed all other systems and they were negative.   Physical Exam  General:  overweight-appearing.  alert, well-developed, well-nourished, and cooperative to examination.    Eyes:  vision grossly intact; pupils equal, round and reactive to light.  conjunctiva and lids normal.    Ears:  normal pinnae bilaterally, without erythema, swelling, or tenderness to palpation. TMs clear, without effusion, or cerumen impaction. Hearing grossly normal bilaterally  Mouth:  teeth and gums in good repair; mucous membranes moist, without lesions or ulcers. oropharynx clear without exudate, no erythema.  Lungs:  normal respiratory effort, no intercostal retractions or use of accessory muscles; normal breath sounds bilaterally - no crackles and no wheezes.    Heart:  normal rate, regular rhythm, no murmur, and no rub. BLE without edema. normal DP pulses and normal cap refill in all 4 extremities    Abdomen:  soft, non-tender, normal bowel sounds, no distention; no masses and no appreciable hepatomegaly or splenomegaly.   Rectal:  defer Msk:  No deformity or scoliosis noted of thoracic or lumbar spine.   Neurologic:  alert & oriented X3 and cranial nerves II-XII symetrically intact.  strength normal in all extremities, sensation intact to light touch, and gait normal. speech fluent  without dysarthria or aphasia; follows commands with good comprehension.  Skin:  no rashes, vesicles, ulcers, or erythema. No nodules or irregularity to palpation.  Psych:  Oriented X3, memory intact for recent and remote, normally interactive, good eye contact, not anxious appearing, not depressed appearing, and not agitated.      Impression & Recommendations:  Problem # 1:  DIABETES MELLITUS, BORDERLINE (ICD-790.29) +FH and reports fasting glc 168 -- unsure what a1c was (if done) will send for records to review - not surprising given weight and +FH if progress towards DM - encouraged pt to meet with employer health program/nutritionist re: same - (pt reports no referral needed for this) reviewed need for life style changes to include change in eating habits and exercise level - Time spent with patient 45 minutes, more than 50% of this time was spent counseling  patient on diet, meds and reviewing PMH  Problem # 2:  HEMORRHOIDS, INTERNAL (ICD-455.0)  symptoms intermittent for YEARS - discussion re: need for procedure to eliminate problem (such as banding with GI) but pt wished to maximize med symptoms tx 1st - trial analpram for discomfort/itch - erx done pt will contact GI as needed   Orders: Prescription Created Electronically [phone])  Problem # 3:  HYPERTENSION (ICD-401.9)  send for records - no med change Her updated medication list for this problem includes:    Triamterene-hctz 37.5-25 Mg Tabs (Triamterene-hctz) .[name]... Take 1 by mouth once daily  BP today: 118/84  Orders: Prescription Created Electronically [id number])  Problem # 4:  HYPERLIPIDEMIA (ICD-272.4)  send for med records - no change Her updated medication list for this problem includes:    Tricor 145 Mg Tabs (Fenofibrate) .[name]... Take 1 by mouth once daily    Simvastatin 20 Mg Tabs (Simvastatin) .[name]... Take 1 po once daily  Orders: Prescription Created Electronically [phone])  Problem # 5:  DEPRESSION  (ICD-311) stable symptoms - no med changes send for records from prior PCP Her updated medication list for this problem includes:    Zoloft 100 Mg Tabs (Sertraline hcl) .[name]... Take 1 by mouth once daily    Clonazepam 1 Mg Tabs (Clonazepam) .[name]... Take 1-2 by mouth as needed  Complete Medication List: 1)  Triamterene-hctz 37.5-25 Mg Tabs (Triamterene-hctz) .... Take 1 by mouth once daily 2)  Zoloft 100 Mg Tabs (Sertraline hcl) .... Take 1 by mouth once daily 3)  Tricor 145 Mg Tabs (Fenofibrate) .... Take 1 by mouth once daily 4)  Simvastatin 20 Mg Tabs (Simvastatin) .... Take 1 po once daily 5)  Clonazepam 1 Mg Tabs (Clonazepam) .... Take 1-2 by mouth as needed 6)  Omega-3 350 Mg Caps (Omega-3 fatty acids) .... Take 1 by mouth once daily 7)  Caltrate 600+d 600-400 Mg-unit Tabs (Calcium carbonate-vitamin d) .... Take 1 by mouth once daily 8)  Cultrelle (probiotic)  .... Take 1 by mouth once daily 9)  Analpram-hc 1-2.5 % Crea (Hydrocortisone ace-pramoxine) .... Apply after bm as needed for rectal pain three times a day (external use)  Patient Instructions: 1)  it was good to see you today.  2)  we have reviewed you medical history and medications today - 3)  will send release of records from [place] to review here 4)  continue medications as ongoing - no change 5)  try AnalpramHC for rectal pain symptoms - if continued problems, schedule followup with GI as discussed - your prescription has been electronically submitted to your pharmacy. Please take as directed. Contact our office if you believe you're having problems with the medication(s).  6)  meet with the nutritionist as discussed - let me know if you need a referral - 7)  Please schedule a follow-up appointment in 3-4 months, sooner if problems.  Prescriptions: ANALPRAM-HC 1-2.5 % CREA (HYDROCORTISONE ACE-PRAMOXINE) apply after BM as needed for rectal pain three times a day (external use)  #30g x 1   Entered and Authorized by:   Newt Lukes MD   Signed by:   Newt Lukes MD on 05/18/2010   Method used:   Electronically to        [facility]* (retail)       [address]       [address] Shipping/mailing       [place], [place]  [address]  Ph: [phone]       Fax: [phone]   RxID:   [id number]    Immunization History:  Influenza Immunization History:    Influenza:  historical (08/20/2009)

## 2010-12-22 NOTE — Medication Information (Signed)
Summary: True Results Gluometer & strips,Nutrition Counseling/[facility]  True Results Gluometer & strips,Nutrition Counseling/[facility]   Imported By: Sherian Rein 10/01/2010 09:23:07  _____________________________________________________________________  External Attachment:    Type:   Image     Comment:   External Document

## 2010-12-22 NOTE — Medication Information (Signed)
[facility]   Imported By: Lester Impact 06/03/2010 08:07:00  _____________________________________________________________________  External Attachment:    Type:   Image     Comment:   External Document

## 2010-12-22 NOTE — Letter (Signed)
Summary: [facility]  [facility]   Imported By: Sherian Rein 10/01/2010 09:19:53  _____________________________________________________________________  External Attachment:    Type:   Image     Comment:   External Document

## 2010-12-22 NOTE — Letter (Signed)
[facility]   Imported By: Lester Groveton 10/25/2010 11:51:56  _____________________________________________________________________  External Attachment:    Type:   Image     Comment:   External Document

## 2010-12-22 NOTE — Progress Notes (Signed)
Summary: Appt?  Phone Note Call from Patient Call back at Home Phone [phone]   Caller: Patient Summary of Call: Pt called stating that she has had to reschedule appt with Dr Russella Dar in GI to 09/28. Pt states that Hemorrhoids have gotten worse and are extremely painful. Pt is requesting VAL request an earlier appt for pt. Pt states hse is having an increasingly difficult time going to and staying at work. Please advise. Initial call taken by: Margaret Pyle, CMA,  July 13, 2010 11:05 AM  Follow-up for Phone Call        if she is willing to see one of the PAs in his division, she can probably be worked in much sooner - please look into this (?triage or [facility] can call GI on my behalf?) thanks Follow-up by: Newt Lukes MD,  July 13, 2010 12:11 PM  Additional Follow-up for Phone Call Additional follow up Details #1::        Pt informed via VM, informed to call back and let me know if an OV with a PA is okay. Additional Follow-up by: Margaret Pyle, CMA,  July 13, 2010 1:23 PM    Additional Follow-up for Phone Call Additional follow up Details #2::    pt is okay with seeing a PA for urgency. Pt is requesting afternoon appt. Forwarded to [facility] to sch, VM OK Margaret Pyle, CMA  July 13, 2010 2:19 PM   Additional Follow-up for Phone Call Additional follow up Details #3:: Details for Additional Follow-up Action Taken: I called [facility] they will traige the call and call me back to inform if she can be worked in with a pa Shelbie Proctor  July 14, 2010 8:22 AM

## 2010-12-22 NOTE — Letter (Signed)
Summary: New Patient letter  [facility]  [address] [place], [place] [address]   Phone: [phone]  Fax: [phone]       06/20/2010 MRN: [id number]  Destiny Tucker [address] [place], [place]  [address]  Dear Ms. Destiny Tucker,  Welcome to the Gastroenterology Division at [facility].    You are scheduled to see Dr.  Russella Dar on 07-13-10 at 11:00a.m. on the 3rd floor at [facility], [address]  We ask that you try to arrive at our office 15 minutes prior to your appointment time to allow for check-in.  We would like you to complete the enclosed self-administered evaluation form prior to your visit and bring it with you on the day of your appointment.  We will review it with you.  Also, please bring a complete list of all your medications or, if you prefer, bring the medication bottles and we will list them.  Please bring your insurance card so that we may make a copy of it.  If your insurance requires a referral to see a specialist, please bring your referral form from your primary care physician.  Co-payments are due at the time of your visit and may be paid by cash, check or credit card.     Your office visit will consist of a consult with your physician (includes a physical exam), any laboratory testing he/she may order, scheduling of any necessary diagnostic testing (e.g. x-ray, ultrasound, CT-scan), and scheduling of a procedure (e.g. Endoscopy, Colonoscopy) if required.  Please allow enough time on your schedule to allow for any/all of these possibilities.    If you cannot keep your appointment, please call [phone] to cancel or reschedule prior to your appointment date.  This allows [place] the opportunity to schedule an appointment for another patient in need of care.  If you do not cancel or reschedule by 5 p.m. the business day prior to your appointment date, you will be charged a $50.00 late cancellation/no-show fee.    Thank you for choosing  [facility] for your medical needs.  We appreciate the opportunity to care for you.  Please visit [place] at our website  to learn more about our practice.                     Sincerely,                                                             The Gastroenterology Division

## 2010-12-22 NOTE — Progress Notes (Signed)
Summary: DM med SE?  Phone Note Call from Patient Call back at Home Phone [phone]   Caller: Patient VM OK Summary of Call: Pt called stating that DM med are causing diarrhea. Pt is requesting MD advise on the best time of day to take meds to help avoid side effects. Pt is also requesting med adjustment to 1 tab qd. Initial call taken by: Margaret Pyle, CMA,  September 01, 2010 10:04 AM  Follow-up for Phone Call        ok to take metformin 1 by mouth daily x 7-10 days, then increase to two times a day after GI issues have improved - no particular time of day but i rec AM rather than PM - she can see what works best for her - thanks Follow-up by: Newt Lukes MD,  September 01, 2010 11:25 AM  Additional Follow-up for Phone Call Additional follow up Details #1::        Pt informed. Additional Follow-up by: Margaret Pyle, CMA,  September 01, 2010 1:52 PM    New/Updated Medications: ONETOUCH ULTRA 2 W/DEVICE KIT (BLOOD GLUCOSE MONITORING SUPPL)

## 2011-01-12 ENCOUNTER — Encounter: Payer: Self-pay | Admitting: Internal Medicine

## 2011-01-23 ENCOUNTER — Telehealth: Payer: Self-pay | Admitting: Internal Medicine

## 2011-01-26 ENCOUNTER — Encounter: Payer: Self-pay | Admitting: Internal Medicine

## 2011-01-26 ENCOUNTER — Ambulatory Visit (INDEPENDENT_AMBULATORY_CARE_PROVIDER_SITE_OTHER): Payer: 59 | Admitting: Internal Medicine

## 2011-01-26 DIAGNOSIS — J069 Acute upper respiratory infection, unspecified: Secondary | ICD-10-CM

## 2011-01-26 NOTE — Letter (Signed)
Summary: Wellness Program/[facility]  Wellness Program/[facility]   Imported By: Sherian Rein 01/17/2011 12:34:01  _____________________________________________________________________  External Attachment:    Type:   Image     Comment:   External Document

## 2011-01-31 NOTE — Progress Notes (Signed)
Summary: novotwist  Phone Note From Pharmacy   Caller: [facility]* Summary of Call: Need new rx for Novotwist 32g pen needles. Pt iis now using 2 x's with Byetta. We have rx on file for once daily. Sending new rx to  pharm Initial call taken by: Orlan Leavens RMA,  January 23, 2011 1:09 PM    Prescriptions: NOVOTWIST 32G X 5 MM MISC (INSULIN PEN NEEDLE) two times a day with byetta  #180 x 2   Entered by:   Orlan Leavens RMA   Authorized by:   Newt Lukes MD   Signed by:   Orlan Leavens RMA on 01/23/2011   Method used:   Electronically to        [facility]* (retail)       [address]       [address] Shipping/mailing       [place], [place]  [address]       Ph: [phone]       Fax: [phone]   RxID:   [id number]

## 2011-01-31 NOTE — Assessment & Plan Note (Signed)
Summary: DR VL PT--NO PM CLINIC--ACUTE VISIT ONLY--COUGH-SORE THROAT-E...   Vital Signs:  Patient profile:   56 year old female Height:      64.5 inches Weight:      224 pounds BMI:     37.99 O2 Sat:      97 % on Room air Temp:     98.3 degrees F oral Pulse rate:   94 / minute Pulse rhythm:   regular BP sitting:   110 / 72  (left arm) Cuff size:   large  Vitals Entered By: Rock Nephew CMA (January 26, 2011 1:44 PM)  O2 Flow:  Room air CC: Patient c/o Bilateral ear pressure/pain, post nasel drip and cough x 2wks Is Patient Diabetic? No  Does patient need assistance? Functional Status Self care Ambulation Normal   Primary Care Provider:  Rene Paci MD  CC:  Patient c/o Bilateral ear pressure/pain and post nasel drip and cough x 2wks.  History of Present Illness: Patient of Dr. Felicity Coyer who developed laryngitis 12 days ago. For the past 3-4 days sore throat and right ear ache. Cough - barking and producing a yellowish mucus and blood tinged rhinnorhea. She has been taking mucinex. She has continued to feel bad, sleeping with her ear on a heating pad. She has self-examined her throat - red. She did use ear-wax removal aide to the ears. No fevers, no N/V/D.   Current Medications (verified): 1)  Triamterene-Hctz 37.5-25 Mg Tabs (Triamterene-Hctz) .... Take 1 By Mouth Once Daily 2)  Zoloft 100 Mg Tabs (Sertraline Hcl) .... Take 1 By Mouth Once Daily 3)  Fenofibrate Micronized 134 Mg Caps (Fenofibrate Micronized) .[name].. 1 By Mouth Once Daily 4)  Simvastatin 20 Mg Tabs (Simvastatin) .... Take 1 Po Once Daily 5)  Clonazepam 1 Mg Tabs (Clonazepam) .... Take 1-2 By Mouth Once Daily As Needed 6)  Omega-3 350 Mg Caps (Omega-3 Fatty Acids) .... Take 1 By Mouth Once Daily 7)  Caltrate 600+d 600-400 Mg-Unit Tabs (Calcium Carbonate-Vitamin D) .... Take 1 By Mouth Once Daily 8)  Metformin Hcl 500 Mg Xr24h-Tab (Metformin Hcl) .[name].. 1 By Mouth Two Times A Day 9)  Fluconazole 150 Mg Tabs  (Fluconazole) .[name].. 1 By Mouth Once Daily X 3 Days, Repeat If Needed For Yeast Symptoms 10)  Onetouch Ultra 2 W/device Kit (Blood Glucose Monitoring Suppl) 11)  Onetouch Ultra Blue  Strp (Glucose Blood) .... Use As Directed Three Times A Day 12)  Onetouch Delica Lancets  Misc (Lancets) .... Use As Directed Three Times A Day 13)  Colace 100 Mg Caps (Docusate Sodium) .... Once Daily 14)  Anusol-Hc 25 Mg Supp (Hydrocortisone Acetate) .... Insert 1 Suppository Into Rectum At Bedtime 15)  Citrucel 500 Mg Tabs (Methylcellulose (Laxative)) .... As Directed 16)  Byetta 5 Mcg Pen 5 Mcg/0.25ml Soln (Exenatide) .... Subcutaneously Two Times A Day Before Meals 17)  Novotwist 32g X 5 Mm Misc (Insulin Pen Needle) .... Two Times A Day With Byetta  Allergies (verified): 1)  ! Codeine  Past History:  Past Medical History: Last updated: 11/18/2010 ANXIETY DEPRESSION OSTEOPENIA HEMORRHOIDS, INTERNAL  Allergic rhinitis Hyperlipidemia Hypertension diabetes mellitus type 2  MD roster:   GI - [facility] gyn - tomblin derm - gruber/gould  Past Surgical History: Last updated: 10/10/2010 Unilateral salpingo-oophorectomy Mohs Surgery for precancerous skin lesion Removal of fallopion tube & ovary (1986) Removal of cyst & other ovary 09/2004  Anal surgery Hysterectomy  Family History: Last updated: 05/18/2010 No FH of Colon Cancer:  Family History of Diabetes: Maternal Uncles Family History of Heart Disease:  Family History High cholesterol (parent) Family History Hypertension (parent) Kidnet stones (both parent) Mother has alzheimers    Review of Systems  The patient denies anorexia, fever, weight loss, weight gain, vision loss, decreased hearing, chest pain, syncope, dyspnea on exertion, prolonged cough, headaches, abdominal pain, hematochezia, hematuria, genital sores, muscle weakness, difficulty walking, unusual weight change, abnormal bleeding, and angioedema.    Physical  Exam  General:  overweight white woman in no acute distress Head:  normocephalic and atraumatic.  No tenderness to percussion over the sinuses Eyes:  C&S clear Ears:  EACs and TMs  normal in appearance Nose:  no external deformity and no external erythema.   Mouth:  No oral lesions. Posterior pharynx clear, tonsils presnet and normal Neck:  supple.   Lungs:  normal respiratory effort and normal breath sounds.   Heart:  normal rate and regular rhythm.   Abdomen:  obese Neurologic:  alert & oriented X3, cranial nerves II-XII intact, strength normal in all extremities, and sensation intact to light touch.   Cervical Nodes:  no anterior cervical adenopathy and no posterior cervical adenopathy.   Psych:  Oriented X3, memory intact for recent and remote, and normally interactive.     Complete Medication List: 1)  Triamterene-hctz 37.5-25 Mg Tabs (Triamterene-hctz) .... Take 1 by mouth once daily 2)  Zoloft 100 Mg Tabs (Sertraline hcl) .... Take 1 by mouth once daily 3)  Fenofibrate Micronized 134 Mg Caps (Fenofibrate micronized) .[name].. 1 by mouth once daily 4)  Simvastatin 20 Mg Tabs (Simvastatin) .... Take 1 po once daily 5)  Clonazepam 1 Mg Tabs (Clonazepam) .... Take 1-2 by mouth once daily as needed 6)  Omega-3 350 Mg Caps (Omega-3 fatty acids) .... Take 1 by mouth once daily 7)  Caltrate 600+d 600-400 Mg-unit Tabs (Calcium carbonate-vitamin d) .... Take 1 by mouth once daily 8)  Metformin Hcl 500 Mg Xr24h-tab (Metformin hcl) .[name].. 1 by mouth two times a day 9)  Fluconazole 150 Mg Tabs (Fluconazole) .[name].. 1 by mouth once daily x 3 days, repeat if needed for yeast symptoms 10)  Onetouch Ultra 2 W/device Kit (Blood glucose monitoring suppl) 11)  Onetouch Ultra Blue Strp (Glucose blood) .... Use as directed three times a day 12)  Onetouch Delica Lancets Misc (Lancets) .... Use as directed three times a day 13)  Colace 100 Mg Caps (Docusate sodium) .... Once daily 14)  Anusol-hc 25 Mg Supp  (Hydrocortisone acetate) .... Insert 1 suppository into rectum at bedtime 15)  Citrucel 500 Mg Tabs (Methylcellulose (laxative)) .... As directed 16)  Byetta 5 Mcg Pen 5 Mcg/0.53ml Soln (Exenatide) .... subcutaneously two times a day before meals 17)  Novotwist 32g X 5 Mm Misc (Insulin pen needle) .... Two times a day with byetta 18)  Amoxicillin 875 Mg Tabs (Amoxicillin) .[name].. 1 by mouth two times a day x 5 days for uri  Patient Instructions: 1)  Upper respiratory infection - no obvious evidence of bactrial infection but prolonged duration. Plan - amoxicillin 875mg  two times a day x 5 days (anti-biotic); robitussin DM or the generic equivalent; (contains guafenesin = mucinex); sudafed 30mg  three times a day for ear pressure and congestion.  Hydrate well. Tylenol 500mg  three times a day for general discomfort.  Prescriptions: AMOXICILLIN 875 MG TABS (AMOXICILLIN) 1 by mouth two times a day x 5 days for URI  #10 x 0   Entered and Authorized by:  Jacques Navy MD   Signed by:   Jacques Navy MD on 01/26/2011   Method used:   Electronically to        [facility]* (retail)       [id number]       [address] Shipping/mailing       [place], [place]  [address]       Ph: [phone]       Fax: [phone]   RxID:   [id number]    Orders Added: 1)  Est. Patient Level III [34742]

## 2011-02-08 ENCOUNTER — Encounter: Payer: Self-pay | Admitting: *Deleted

## 2011-02-15 ENCOUNTER — Other Ambulatory Visit (INDEPENDENT_AMBULATORY_CARE_PROVIDER_SITE_OTHER): Payer: 59

## 2011-02-15 ENCOUNTER — Encounter: Payer: Self-pay | Admitting: Internal Medicine

## 2011-02-15 ENCOUNTER — Ambulatory Visit (INDEPENDENT_AMBULATORY_CARE_PROVIDER_SITE_OTHER): Payer: 59 | Admitting: Internal Medicine

## 2011-02-15 VITALS — BP 118/68 | HR 101 | Temp 98.6°F | Ht 64.5 in | Wt 222.0 lb

## 2011-02-15 DIAGNOSIS — E785 Hyperlipidemia, unspecified: Secondary | ICD-10-CM

## 2011-02-15 DIAGNOSIS — I10 Essential (primary) hypertension: Secondary | ICD-10-CM

## 2011-02-15 DIAGNOSIS — E119 Type 2 diabetes mellitus without complications: Secondary | ICD-10-CM

## 2011-02-15 LAB — HEMOGLOBIN A1C: Hgb A1c MFr Bld: 8.3 % — ABNORMAL HIGH (ref 4.6–6.5)

## 2011-02-15 MED ORDER — SITAGLIPTIN PHOS-METFORMIN HCL 50-500 MG PO TABS: 1.0000 | ORAL_TABLET | Freq: Two times a day (BID) | ORAL | Status: DC

## 2011-02-15 NOTE — Progress Notes (Signed)
  Subjective:    Patient ID: Destiny Tucker, female    DOB: Feb 09, 1956, 55 y.o.   MRN: [id number]  HPI Here for follow up:  1) dyslipidemia - reports compliance with ongoing medical treatment and no changes in medication dose or frequency. denies adverse side effects related to current therapy. no GI or Mskel problems-  2) DM2: started metformin 08/2010 for new dx ("borderline" prior), strong +FH same - s/p meeting with nutritionist through work program re: weight loss mgmt and diet control of same - home cbgs check 2x/d - added byetta 10/2010 but inconsistent dosing, problems with shots  3) HTN - reports compliance with ongoing medical treatment and no changes in medication dose or frequency. denies adverse side effects related to current therapy. no CP or edema or HA or vision changes  4) depression - hx same - well controlled symptoms on current tx - reports compliance with ongoing medical treatment and no changes in medication dose or frequency. denies adverse side effects related to current therapy.   Past Medical History  Diagnosis Date  . ANXIETY 10/26/2008  . HYPERLIPIDEMIA 05/18/2010  . DEPRESSION 05/18/2010  . HYPERTENSION 05/18/2010  . OSTEOPENIA 10/26/2008  . ALLERGIC RHINITIS 05/18/2010  . TOBACCO USE, QUIT 05/18/2010     Review of Systems  Constitutional: Negative for fever and fatigue.  Respiratory: Negative for cough and shortness of breath.   Cardiovascular: Negative for chest pain.  Neurological: Negative for headaches.       Objective:   Physical Exam BP 118/68  Pulse 101  Temp(Src) 98.6 F (37 C) (Oral)  Ht 5' 4.5" (1.638 m)  Wt 222 lb (100.699 kg)  BMI 37.52 kg/m2 Physical Exam  Constitutional: She is oriented to person, place, and time. She appears well-developed and well-nourished. No distress.  Eyes: Conjunctivae and EOM are normal. Pupils are equal, round, and reactive to light. No scleral icterus.  Neck: Normal range of motion. Neck supple. No JVD  present. No thyromegaly present.  Cardiovascular: Normal rate, regular rhythm and normal heart sounds.  No murmur heard. Pulmonary/Chest: Effort normal and breath sounds normal. No respiratory distress. She has no wheezes.  Psychiatric: She has a normal mood and affect. Her behavior is normal. Judgment and thought content normal.   Lab Results  Component Value Date   CHOL 150 08/25/2010   TRIG 227.0* 08/25/2010   HDL 30.20* 08/25/2010   LDLDIRECT 85.5 08/25/2010   ALT 33 08/25/2010   AST 29 08/25/2010   NA 136 08/25/2010   K 3.9 08/25/2010   CL 99 08/25/2010   CREATININE 0.6 08/25/2010   BUN 17 08/25/2010   CO2 28 08/25/2010   HGBA1C 8.2* 11/15/2010   MICROALBUR 0.3 08/30/2010      Wt Readings from Last 3 Encounters:  02/15/11 222 lb (100.699 kg)  01/26/11 224 lb (101.606 kg)  11/18/10 221 lb (100.245 kg)    Assessment & Plan:  See problem list. Medications and labs reviewed today.

## 2011-02-15 NOTE — Assessment & Plan Note (Signed)
Will change meds now for improved compliance Metformin solo begun 08/2010 - noncompliance with byetta - change now to [facility] Lab Results  Component Value Date   HGBA1C 8.2* 11/15/2010

## 2011-02-15 NOTE — Patient Instructions (Signed)
It was good to see you today. Test(s) ordered today. Your results will be called to you after review (48-72hours after test completion). If any changes need to be made, you will be notified at that time. Medications reviewed, will make the following changes at this time: Stop byetta and metformin Start jaumet Alma Friendly combined with metformin in one pill) Your prescription(s) have been submitted to your pharmacy. Please take as directed and contact our office if you believe you are having problem(s) with the medication(s). Please schedule followup in 3-4 months for diabetes review and weight check, call sooner if problems.

## 2011-02-15 NOTE — Assessment & Plan Note (Signed)
BP Readings from Last 3 Encounters:  02/15/11 118/68  01/26/11 110/72  11/18/10 102/64   The current medical regimen is effective;  continue present plan and medications.

## 2011-02-15 NOTE — Assessment & Plan Note (Signed)
The current medical regimen is effective;  continue present plan and medications.  

## 2011-02-16 ENCOUNTER — Telehealth: Payer: Self-pay | Admitting: Internal Medicine

## 2011-02-16 NOTE — Telephone Encounter (Signed)
Please call patient - DM results uncontrolled as expected (a1c 8.3, unchanged from 10/2010). No additional medication changes recommended (chnaged to Janumet at OV). Thanks.

## 2011-02-16 NOTE — Telephone Encounter (Signed)
Called pt no ansew LMOM RTC concerning labs...02/16/11@10 :37am/LMB

## 2011-02-16 NOTE — Telephone Encounter (Signed)
Pt return call back gave results concerning labs & md recommendations.Marland KitchenMarland Kitchen3/29/12@11 :13am/LMB

## 2011-02-17 ENCOUNTER — Telehealth: Payer: Self-pay | Admitting: Internal Medicine

## 2011-02-17 MED ORDER — SITAGLIPTIN PHOSPHATE 100 MG PO TABS: 100.0000 mg | ORAL_TABLET | Freq: Every day | ORAL | Status: DC

## 2011-02-17 MED ORDER — METFORMIN HCL 500 MG PO TABS: 500.0000 mg | ORAL_TABLET | Freq: Two times a day (BID) | ORAL | Status: DC

## 2011-02-17 MED ORDER — ASPIRIN EC 81 MG PO TBEC: 81.0000 mg | DELAYED_RELEASE_TABLET | Freq: Every day | ORAL | Status: DC

## 2011-02-17 NOTE — Telephone Encounter (Signed)
Called pt to make sure she know that she will be taking 2 pills (Januvia & Metformin). Will fax form back to [facility] with info also.Marland KitchenMarland Kitchen3/30/12@4 :15pm/LMB

## 2011-02-17 NOTE — Telephone Encounter (Signed)
Fax from [facility] re: Janumet not covered - request change to individual meds; also ?start ASA 81qd - each request ok - [facility] - please let Marylu Lund know same [phone]/fax [phone]

## 2011-02-20 ENCOUNTER — Other Ambulatory Visit: Payer: Self-pay | Admitting: Internal Medicine

## 2011-02-20 DIAGNOSIS — F419 Anxiety disorder, unspecified: Secondary | ICD-10-CM

## 2011-02-20 NOTE — Telephone Encounter (Signed)
Faxed script back to pharmacy...02/20/11@1 :28pm/LMB

## 2011-02-20 NOTE — Telephone Encounter (Signed)
Ok to refill - prescription printed and signed - placed on desk  

## 2011-03-21 ENCOUNTER — Encounter: Payer: Self-pay | Admitting: Internal Medicine

## 2011-03-22 ENCOUNTER — Ambulatory Visit: Payer: 59 | Admitting: Internal Medicine

## 2011-04-07 NOTE — Op Note (Signed)
NAME:  Destiny Tucker, Destiny Tucker               ACCOUNT NO.:  [id number]   MEDICAL RECORD NO.:  [id number]          PATIENT TYPE:  AMB   LOCATION:  SDC                           FACILITY:  [facility]   PHYSICIAN:  Guy Sandifer. Tomblin II, M.D.DATE OF BIRTH:  08/21/1956   DATE OF PROCEDURE:  09/21/2004  DATE OF DISCHARGE:                                 OPERATIVE REPORT   PREOPERATIVE DIAGNOSES:  1.  Menorrhagia.  2.  Right ovarian cyst.   POSTOPERATIVE DIAGNOSES:  1.  Endometrial polyp.  2.  Uterine leiomyomata.  3.  Right ovarian cyst.  4.  Pelvic adhesions.   PROCEDURES:  1.  Laparoscopy with right salpingo-oophorectomy and extensive lysis of      adhesions.  2.  Hysteroscopy with resection of endometrial polyp.  3.  Dilatation and curettage.  4.  Paracervical block with 1% Xylocaine.   SURGEON:  Harold Hedge, M.D.   ANESTHESIA:  General with endotracheal intubation.   ESTIMATED BLOOD LOSS:  Less than 50 mL.   INS AND OUTS OF DISTENDING MEDIA:  110 mL deficit (some of that on the  floor).   SPECIMENS:  1.  Right tube and ovary.  2.  Endometrial polyp.  3.  Endometrial curettings.   INDICATIONS AND CONSENT:  This patient is a 55 year old married, white  female, G2, P1, status post LSO with increasing heavy menses.  Details are  dictated in the History and Physical.  Hysteroscopy with resectoscope D&C,  laparoscopy, probable right salpingo-oophorectomy has been discussed with  the patient.  Potential risks and complications were reviewed preoperatively  including but not limited to infection, bowel, bladder, ureteral damage,  uterine perforation, bleeding requiring transfusion of blood products and  possible transfusion reaction, HIV and hepatitis acquisition, DVT, PE,  pneumonia, laparotomy, recurrent abnormal bleeding.  Issues of menopause  with a uterus in place were discussed with the patient preoperatively as  well.  All questions were answered and consent is signed on the  chart.   FINDINGS:  There is an approximately 2 cm endometrial polyp, arising from  the superior posterior wall of the endometrial canal.  The fallopian tube  ostia are identified bilaterally.  The upper abdomen is grossly normal.  There are multiple filmy adhesions of the omentum in the right lower  quadrant.  The small bowel is widely adherent to the uterine fundus  superiorly and posteriorly.  There are multiple omental adhesions that are  densely adherent to the right ovary on its medial aspect.  The remainder of  the peritoneal cavity is smooth.   PROCEDURE IN DETAIL:  The patient is taken to the operating room where she  is identified, placed in the dorsal supine position and general anesthesia  is induced with endotracheal intubation.  She is then placed in the dorsal  lithotomy where she is prepped abdominally and vaginally, the bladder  straight catheterized, and she is draped in a sterile fashion.  A bivalve  speculum is placed in the vagina.  The anterior cervical lip is injected  with 1% Xylocaine and grasped with a single-tooth tenaculum.  The  paracervical block is placed in the 2, 4, 5, 7, 8, and 10 o'clock positions  with approximately 20 mL total of 1% plain Xylocaine.  The cervix was gently  progressively dilated with Shawnie Pons dilators.  Diagnostic hysteroscope is  placed in the endocervical canal and advanced under direct visualization  using distending media.  The above findings were noted.  The hysteroscope is  withdrawn.  The cervix is further dilated with Pratt dilators and the  resectoscope with the single right angle wire loop is placed under direct  visualization.  The polyp is resected in a simple fashion.  Good hemostasis  is noted.  The hysteroscope is removed.  Sharp curettage is carried out.  The single-tooth tenaculum is replaced with a Hulka tenaculum and attention  is turned to the abdomen.   An infraumbilical incision is made and towel clips are used to  grasp the  abdominal wall on either side of the umbilicus.  Disposable Veress needle is  placed on the first attempt without difficulty.  Syringe and drop test are  normal, 2 L of gas are insufflated under low pressure with good tympany in  the right upper quadrant.  Veress needle is removed and replaced with a  disposable 10-11 trocar sleeve.  Placement is verified with the laparoscope  and no damage to surrounding structures is noted.  The above findings are  noted.  Using scissors and bipolar cautery, the omental adhesions to the  right lower quadrant are taken down without difficulty.  Then a small  suprapubic incision is made and later a right lower quadrant incision is  made and 5 mm disposable trocar sleeves are placed under direct  visualization without difficulty.  Then using sharp dissection, the  adhesions to the right ovary are carefully removed.  In this way the ovary  is totally mobilized.  Then using the Gyrus bipolar cautery cutting  instrument coming across the infundibulopelvic across the mesosalpinx and  finally across the utero-ovarian ligament, is used to remove the right  ovary.  The course of the ureter is well cleared of this area of surgery.  Then using the 5 mm laparoscope in the suprapubic trocar sleeve, the  EndoCatch is used through the umbilical trocar sleeve to remove the right  tube and ovary without difficulty.  Then switching back to the operative  laparoscope, copious irrigation is carried out and excellent hemostasis is  obtained with brief bipolar cautery.  Excess fluid is removed.  Trocar  sleeves are removed.  Good hemostasis is noted all around.  Umbilical trocar  sleeve is removed, 0 Vicryl suture is used in a single stitch to approximate  the fascia and the umbilical incision with care being taken not to pick up  any underlying structures.  All incisions were then injected with 0.5% plain Marcaine and the skin is closed with 3-0 Vicryl suture in a  simple fashion.  Hulka tenaculum is removed and good hemostasis is noted.  All counts were  correct.  The patient is awakened and taken to recovery room in stable  condition.      JET/MEDQ  D:  09/21/2004  T:  09/21/2004  Job:  [id number]

## 2011-04-07 NOTE — H&P (Signed)
NAME:  Destiny Tucker, Destiny Tucker               ACCOUNT NO.:  [id number]   MEDICAL RECORD NO.:  [id number]          PATIENT TYPE:  AMB   LOCATION:  SDC                           FACILITY:  [facility]   PHYSICIAN:  Guy Sandifer. Tomblin II, M.D.DATE OF BIRTH:  March 04, 1956   DATE OF ADMISSION:  09/21/2004  DATE OF DISCHARGE:                                HISTORY & PHYSICAL   CHIEF COMPLAINT:  Heavy menses and right ovarian cyst.   HISTORY OF PRESENT ILLNESS:  This patient is a 55 year old, married white  female, G2, P1 status post left salpingo-oophorectomy in the 1980's who has  increasingly heavy menses.  Ultrasound on August 05, 2004 revealed the  uterus measuring 6.4 x 3.5 x 4.1 cm.  Sonohysterogram revealed a 19 mm  polypoid mass in the endometrial cavity. There is a 2 cm subserosal fibroid.  The right ovary contained a 2.7 cm cyst with low level echo suggestive of  dermoid.  The options to managing this were carefully discussed with the  patient.  Laparoscopy, probable right salpingo-oophorectomy, hysteroscopy  D&C has been reviewed. The potential risks and complications have been  reviewed preoperatively.   PAST MEDICAL HISTORY:  1.  __________ papulosis.  2.  History of condyloma.  3.  Depression.   PAST SURGICAL HISTORY:  1.  Laparotomy with left salpingo-oophorectomy in 1983.  2.  Status post wisdom teeth extraction.   OB HISTORY:  Miscarriage x1, termination x1.   SOCIAL HISTORY:  The patient smokes a pack of cigarettes a day, drinks  alcohol socially, denies drug abuse.   MEDICATIONS:  1.  Zoloft 100 mg q.d.  2.  Klonopin 0.05 mg q.d.   ALLERGIES:  CODEINE leading to nausea and vomiting.   FAMILY HISTORY:  Stroke, diabetes, thyroid disorder, depression and weight  problems.   REVIEW OF SYMPTOMS:  NEUROLOGIC:  Denies recent headache. CARDIOVASCULAR:  Denies chest pain.  PULMONARY:  Denies shortness of breath.   PHYSICAL EXAMINATION:  VITAL SIGNS:  Height 5 feet 6 inches, weight  213.5  pounds, blood pressure 110/78.  HEENT:  Without thyromegaly.  LUNGS:  Clear to auscultation.  HEART:  Regular rate and rhythm.  BACK:  Without CVA tenderness.  BREASTS:  Without masses, traction or discharge.  ABDOMEN:  Obese, soft, nontender without palpable masses.  PELVIC:  Vulva, vagina and cervix without lesion.  Pelvic exam highly  compromised with patient habitus.  EXTREMITIES:  Grossly within normal limits.  NEUROLOGIC:  Grossly within normal limits.   ASSESSMENT:  1.  Menorrhagia.  2.  Right ovarian cyst.   PLAN:  Laparoscopy, probable right salpingo-oophorectomy, hysterectomy,  dilatation and curettage.      JET/MEDQ  D:  09/13/2004  T:  09/13/2004  Job:  [id number]

## 2011-04-11 ENCOUNTER — Other Ambulatory Visit: Payer: Self-pay | Admitting: *Deleted

## 2011-04-11 MED ORDER — TRIAMTERENE-HCTZ 37.5-25 MG PO TABS: 1.0000 | ORAL_TABLET | Freq: Every day | ORAL | Status: DC

## 2011-04-24 ENCOUNTER — Other Ambulatory Visit: Payer: Self-pay | Admitting: Internal Medicine

## 2011-04-24 NOTE — Telephone Encounter (Signed)
Request for; Zoloft 100mg  [last filled 10/17/2010 #90x1] Please advise.

## 2011-05-26 ENCOUNTER — Other Ambulatory Visit: Payer: Self-pay | Admitting: Internal Medicine

## 2011-06-02 ENCOUNTER — Other Ambulatory Visit: Payer: 59

## 2011-06-08 ENCOUNTER — Ambulatory Visit: Payer: 59 | Admitting: Internal Medicine

## 2011-06-12 ENCOUNTER — Other Ambulatory Visit: Payer: Self-pay | Admitting: Internal Medicine

## 2011-06-15 ENCOUNTER — Ambulatory Visit: Payer: 59

## 2011-06-15 DIAGNOSIS — E119 Type 2 diabetes mellitus without complications: Secondary | ICD-10-CM

## 2011-06-16 ENCOUNTER — Other Ambulatory Visit: Payer: 59

## 2011-06-19 ENCOUNTER — Ambulatory Visit (INDEPENDENT_AMBULATORY_CARE_PROVIDER_SITE_OTHER): Payer: 59 | Admitting: Internal Medicine

## 2011-06-19 ENCOUNTER — Encounter: Payer: Self-pay | Admitting: Internal Medicine

## 2011-06-19 ENCOUNTER — Ambulatory Visit: Payer: 59 | Admitting: Internal Medicine

## 2011-06-19 DIAGNOSIS — E785 Hyperlipidemia, unspecified: Secondary | ICD-10-CM

## 2011-06-19 DIAGNOSIS — E119 Type 2 diabetes mellitus without complications: Secondary | ICD-10-CM

## 2011-06-19 DIAGNOSIS — I10 Essential (primary) hypertension: Secondary | ICD-10-CM

## 2011-06-19 MED ORDER — METFORMIN HCL 1000 MG PO TABS: 1000.0000 mg | ORAL_TABLET | Freq: Two times a day (BID) | ORAL | Status: DC

## 2011-06-19 NOTE — Patient Instructions (Signed)
It was good to see you today. Increase metformin dose now and continue Januvia - Your prescription(s) have been submitted to your pharmacy. Please take as directed and contact our office if you believe you are having problem(s) with the medication(s). we'll make referral to endocrinologist for opinion on Diabetes management . Our office will contact you regarding appointment(s) once made. Please schedule followup in 3-4 months for blood pressure and cholesterol check, call sooner if problems.

## 2011-06-19 NOTE — Assessment & Plan Note (Signed)
Will change meds now for improved compliance Metformin solo begun 08/2010 - noncompliance with byetta so added [place] 01/2011 Still rising a1c - will refer to endo and increase metformin at this time - erx done  Lab Results  Component Value Date   HGBA1C 8.7* 06/15/2011   HGBA1C 8.3* 02/15/2011   HGBA1C 8.2* 11/15/2010   Lab Results  Component Value Date   MICROALBUR 0.3 08/30/2010   LDLCALC 9 03/16/2010   CREATININE 0.6 08/25/2010

## 2011-06-19 NOTE — Progress Notes (Signed)
  Subjective:    Patient ID: Destiny Tucker, female    DOB: 1956-10-23, 55 y.o.   MRN: [id number]  HPI  Here for follow up: reviewed chronic medical issues:  dyslipidemia - reports compliance with ongoing medical treatment and no changes in medication dose or frequency. denies adverse side effects related to current therapy. no GI or Mskel problems-  DM2: started metformin 08/2010 for new dx ("borderline" prior), strong +FH same - s/p meeting with nutritionist through work program re: weight loss mgmt and diet control of same - home cbgs check 2x/d - added byetta 10/2010 but inconsistent dosing, problems with shots  HTN - reports compliance with ongoing medical treatment and no changes in medication dose or frequency. denies adverse side effects related to current therapy. no CP or edema or HA or vision changes  depression - hx same - well controlled symptoms on current tx - reports compliance with ongoing medical treatment and no changes in medication dose or frequency. denies adverse side effects related to current therapy.   Past Medical History  Diagnosis Date  . OSTEOPENIA   . TOBACCO USE, QUIT   . HYPERTENSION   . HYPERLIPIDEMIA   . DEPRESSION   . ANXIETY   . ALLERGIC RHINITIS      Review of Systems  Constitutional: Negative for fever and fatigue.  Respiratory: Negative for cough and shortness of breath.   Cardiovascular: Negative for chest pain.  Neurological: Negative for headaches.       Objective:   Physical Exam  BP 142/100  Pulse 97  Temp(Src) 98.2 F (36.8 C) (Oral)  Ht 5' 5.4" (1.661 m)  Wt 224 lb (101.606 kg)  BMI 36.82 kg/m2  SpO2 95%  Constitutional: She is overweight; oriented to person, place, and time. She appears well-developed and well-nourished. No distress.  Eyes: Conjunctivae and EOM are normal. Pupils are equal, round, and reactive to light. No scleral icterus.  Neck: Normal range of motion. Neck supple. No JVD present. No thyromegaly  present.  Cardiovascular: Normal rate, regular rhythm and normal heart sounds.  No murmur heard. No BLE edema. Pulmonary/Chest: Effort normal and breath sounds normal. No respiratory distress. She has no wheezes.  Psychiatric: She has a normal mood and affect. Her behavior is normal. Judgment and thought content normal.   Lab Results  Component Value Date   CHOL 150 08/25/2010   TRIG 227.0* 08/25/2010   HDL 30.20* 08/25/2010   LDLDIRECT 85.5 08/25/2010   ALT 33 08/25/2010   AST 29 08/25/2010   NA 136 08/25/2010   K 3.9 08/25/2010   CL 99 08/25/2010   CREATININE 0.6 08/25/2010   BUN 17 08/25/2010   CO2 28 08/25/2010   HGBA1C 8.7* 06/15/2011   MICROALBUR 0.3 08/30/2010      Wt Readings from Last 3 Encounters:  06/19/11 224 lb (101.606 kg)  02/15/11 222 lb (100.699 kg)  01/26/11 224 lb (101.606 kg)    Assessment & Plan:  See problem list. Medications and labs reviewed today.

## 2011-06-19 NOTE — Assessment & Plan Note (Signed)
BP Readings from Last 3 Encounters:  06/19/11 142/100  02/15/11 118/68  01/26/11 110/72   The current medical regimen is generally effective;  continue present plan and medications without change at this time given prior good control.

## 2011-06-19 NOTE — Assessment & Plan Note (Signed)
On statin and fenofibrate The current medical regimen is effective;  continue present plan and medications.

## 2011-06-27 ENCOUNTER — Telehealth: Payer: Self-pay

## 2011-06-27 DIAGNOSIS — E119 Type 2 diabetes mellitus without complications: Secondary | ICD-10-CM

## 2011-06-27 NOTE — Telephone Encounter (Signed)
FYI.Destiny KitchenMarland KitchenPatient called lmovm stating that MD instructed her to increase her metformin to 2000 mg. Per pt she felt "wierd" and decreased it back to 1000 mg . Patient also wanted to check status of referral to Dr. Lucianne Muss. Thanks

## 2011-06-28 NOTE — Telephone Encounter (Signed)
I will advise pt of referral status, please advise on decrease of metformin, I have left pt a message to call back regarding side effects.

## 2011-06-29 MED ORDER — METFORMIN HCL 1000 MG PO TABS: 1000.0000 mg | ORAL_TABLET | Freq: Every day | ORAL | Status: DC

## 2011-06-29 NOTE — Telephone Encounter (Signed)
Pt advised of updated Rx/pharmacy and status of referral via VM.

## 2011-06-29 NOTE — Telephone Encounter (Signed)
Decrease in metformin noted - ok to continue 1000 qd until seen by endo - med list updated

## 2011-08-30 ENCOUNTER — Other Ambulatory Visit: Payer: Self-pay | Admitting: *Deleted

## 2011-08-30 ENCOUNTER — Other Ambulatory Visit: Payer: Self-pay

## 2011-08-30 MED ORDER — CLONAZEPAM 1 MG PO TABS: 1.0000 mg | ORAL_TABLET | Freq: Two times a day (BID) | ORAL | Status: DC | PRN

## 2011-08-30 MED ORDER — SIMVASTATIN 20 MG PO TABS: 20.0000 mg | ORAL_TABLET | Freq: Every day | ORAL | Status: DC

## 2011-08-30 NOTE — Telephone Encounter (Signed)
Faxed script back to [facility]...08/30/11@9 :27am/LMB

## 2011-10-09 ENCOUNTER — Other Ambulatory Visit: Payer: Self-pay | Admitting: Internal Medicine

## 2011-10-09 ENCOUNTER — Other Ambulatory Visit: Payer: Self-pay | Admitting: *Deleted

## 2011-10-09 ENCOUNTER — Telehealth: Payer: Self-pay | Admitting: *Deleted

## 2011-10-09 MED ORDER — GLUCOSE BLOOD VI STRP: ORAL_STRIP | Status: DC

## 2011-10-09 MED ORDER — TRIAMTERENE-HCTZ 37.5-25 MG PO CAPS: 1.0000 | ORAL_CAPSULE | ORAL | Status: DC

## 2011-10-09 NOTE — Telephone Encounter (Signed)
Received fax stating pt is wanting capsule instead of tablet for her triamterene/hctz she states its much easier to swallow,,,,,10/09/11@3 :28pm/LMB

## 2011-10-19 ENCOUNTER — Other Ambulatory Visit: Payer: Self-pay | Admitting: Internal Medicine

## 2011-11-24 ENCOUNTER — Other Ambulatory Visit: Payer: Self-pay | Admitting: Internal Medicine

## 2011-11-24 NOTE — Telephone Encounter (Signed)
Done

## 2012-01-10 ENCOUNTER — Other Ambulatory Visit: Payer: Self-pay | Admitting: Internal Medicine

## 2012-01-19 ENCOUNTER — Other Ambulatory Visit: Payer: Self-pay | Admitting: Internal Medicine

## 2012-02-28 ENCOUNTER — Telehealth: Payer: Self-pay

## 2012-02-28 NOTE — Telephone Encounter (Addendum)
Pt is welcome to come across street to [place] if xray is needed - or, if we can enter xray to be done at [facility], that is also ok

## 2012-02-28 NOTE — Telephone Encounter (Signed)
Pt called requesting order for an xray on her RT foot after injury. Pt states that her foot is swollen and painful, please advise. She is requesting order to be send to [facility] where she works.

## 2012-02-29 NOTE — Telephone Encounter (Signed)
Pt advised, xray ordered

## 2012-03-22 ENCOUNTER — Other Ambulatory Visit: Payer: Self-pay | Admitting: *Deleted

## 2012-03-22 MED ORDER — SIMVASTATIN 20 MG PO TABS: 20.0000 mg | ORAL_TABLET | Freq: Every day | ORAL | Status: DC

## 2012-04-16 ENCOUNTER — Other Ambulatory Visit: Payer: Self-pay | Admitting: Internal Medicine

## 2012-04-24 ENCOUNTER — Other Ambulatory Visit: Payer: Self-pay | Admitting: Internal Medicine

## 2012-05-15 ENCOUNTER — Telehealth: Payer: Self-pay | Admitting: Internal Medicine

## 2012-05-15 MED ORDER — PREDNISONE (PAK) 10 MG PO TABS: 10.0000 mg | ORAL_TABLET | ORAL | Status: AC

## 2012-05-15 NOTE — Telephone Encounter (Signed)
Patient informed. 

## 2012-05-15 NOTE — Telephone Encounter (Signed)
Caller: Destiny Tucker/Patient; PCP: Rene Paci; CB#: [phone]; ; ; Call regarding Poison Oak After [place] 05/08/12.  Has been using caladryl without improvemnt.  There is some poison oak rash on her face and on all extremities as well.  Per protocol, emergent symptoms denied; advised appt within 24 hours.  Declines appt; would like Rx called in.   INFO TO OFFICE FOR PROVIDER REVIEW/RX/CALLBACK. USES [facility].  MAY REACH PATIENT AT [phone] until 2300 05/15/12.

## 2012-05-15 NOTE — Telephone Encounter (Signed)
pred taper x 6 d

## 2012-05-16 ENCOUNTER — Ambulatory Visit: Payer: 59 | Admitting: Internal Medicine

## 2012-06-03 ENCOUNTER — Other Ambulatory Visit: Payer: Self-pay | Admitting: Internal Medicine

## 2012-06-03 NOTE — Telephone Encounter (Signed)
Faxed script back to [place]... 06/03/12@4 :40pm/LMB

## 2012-06-11 ENCOUNTER — Telehealth: Payer: Self-pay | Admitting: Internal Medicine

## 2012-06-11 NOTE — Telephone Encounter (Signed)
Caller: Tynia/Patient; PCP: Rene Paci; CB#: [phone]; Pt calling today 06/11/12 regarding fell off her riding law mower on 06/08/12.  Sore along left side of breast (rib area) and has bruise on hip.  Taking Advil.  Wants to know if something an be called in for pain. Says very painful getting in and out of car. Advised pt she will need to be seen for evaluation of injuries.  Emergent symptoms r/o by Chest Injury guidelines with exception of chest trauma resulting in tenderness over specific point on chest.  Pt refused appt for today, said not able to come b/c of going to work.  Appt scheduled for tomorrow 06/12/12 at 1:45 PM with Dr. Felicity Coyer, care advice given.

## 2012-06-12 ENCOUNTER — Encounter: Payer: Self-pay | Admitting: Internal Medicine

## 2012-06-12 ENCOUNTER — Ambulatory Visit (INDEPENDENT_AMBULATORY_CARE_PROVIDER_SITE_OTHER): Payer: 59 | Admitting: Internal Medicine

## 2012-06-12 VITALS — BP 134/88 | HR 101 | Temp 98.1°F | Ht 65.0 in | Wt 228.0 lb

## 2012-06-12 DIAGNOSIS — R0781 Pleurodynia: Secondary | ICD-10-CM

## 2012-06-12 DIAGNOSIS — R079 Chest pain, unspecified: Secondary | ICD-10-CM

## 2012-06-12 DIAGNOSIS — E119 Type 2 diabetes mellitus without complications: Secondary | ICD-10-CM

## 2012-06-12 MED ORDER — HYDROCODONE-ACETAMINOPHEN 5-500 MG PO TABS: 1.0000 | ORAL_TABLET | Freq: Three times a day (TID) | ORAL | Status: AC | PRN

## 2012-06-12 NOTE — Progress Notes (Signed)
  Subjective:    Patient ID: Destiny Tucker, female    DOB: November 22, 1955, 56 y.o.   MRN: [id number]  HPI  complains of pain L ribcage Onset 5 days ago Precipitated by direct trauma - fell from riding mower Pain 5/10, not relieved by OTC meds No shortness of breath or cough  Past Medical History  Diagnosis Date  . OSTEOPENIA   . TOBACCO USE, QUIT   . HYPERTENSION   . HYPERLIPIDEMIA   . DEPRESSION   . ANXIETY   . ALLERGIC RHINITIS     Review of Systems  Musculoskeletal: Negative for back pain and joint swelling.  Neurological: Negative for numbness and headaches.       Objective:   Physical Exam BP 134/88  Pulse 101  Temp 98.1 F (36.7 C) (Oral)  Ht 5\' 5"  (1.651 m)  Wt 228 lb (103.42 kg)  BMI 37.94 kg/m2  SpO2 97% Wt Readings from Last 3 Encounters:  06/12/12 228 lb (103.42 kg)  06/19/11 224 lb (101.606 kg)  02/15/11 222 lb (100.699 kg)   Constitutional: She is obese, appears well-developed and well-nourished. No distress.  Cardiovascular: Normal rate, regular rhythm and normal heart sounds.  No murmur heard. No BLE edema. Pulmonary/Chest: Effort normal and breath sounds normal. No respiratory distress. She has no wheezes. tender over Left lower costal margin anterior>midaxillary line Neurological: She is alert and oriented to person, place, and time. No cranial nerve deficit. Coordination normal.  Skin: Bruise L lateral hip- other skin is warm and dry. No rash noted. No erythema.   Lab Results  Component Value Date   GLUCOSE 183* 08/25/2010   CHOL 150 08/25/2010   TRIG 227.0* 08/25/2010   HDL 30.20* 08/25/2010   LDLDIRECT 85.5 08/25/2010   LDLCALC 9 03/16/2010   ALT 33 08/25/2010   AST 29 08/25/2010   NA 136 08/25/2010   K 3.9 08/25/2010   CL 99 08/25/2010   CREATININE 0.6 08/25/2010   BUN 17 08/25/2010   CO2 28 08/25/2010   HGBA1C 8.7* 06/15/2011   MICROALBUR 0.3 08/30/2010       Assessment & Plan:  L rib pain - ?contusion vs fx-  check rib detail xray - pain  control with vicodin prn  See problem list. Medications and labs reviewed today.

## 2012-06-12 NOTE — Patient Instructions (Signed)
It was good to see you today. Test(s) ordered today. Your results will be called to you after review (48-72hours after test completion). If any changes need to be made, you will be notified at that time.  Use vicodin as needed for pain, especially at night - Your prescription(s) have been submitted to your pharmacy. Please take as directed and contact our office if you believe you are having problem(s) with the medication(s). Continue working with Dr Lucianne Muss on your diabetes mellitus

## 2012-06-12 NOTE — Telephone Encounter (Signed)
Noted thanks °

## 2012-06-12 NOTE — Assessment & Plan Note (Signed)
Metformin solo begun 08/2010 - noncompliance with byetta so added januvia 01/2011 Now on byetta and amaryl Following with endo Forde Radon Jeraldine Loots) - meds reviewed Lab Results  Component Value Date   HGBA1C 8.7* 06/15/2011   HGBA1C 8.3* 02/15/2011   HGBA1C 8.2* 11/15/2010   Lab Results  Component Value Date   MICROALBUR 0.3 08/30/2010   LDLCALC 9 03/16/2010   CREATININE 0.6 08/25/2010

## 2012-06-13 ENCOUNTER — Ambulatory Visit (INDEPENDENT_AMBULATORY_CARE_PROVIDER_SITE_OTHER)
Admission: RE | Admit: 2012-06-13 | Discharge: 2012-06-13 | Disposition: A | Discharge status: Home / Self Care | Payer: 59 | Source: Ambulatory Visit | Attending: Internal Medicine | Admitting: Internal Medicine

## 2012-06-13 DIAGNOSIS — R0781 Pleurodynia: Secondary | ICD-10-CM

## 2012-06-13 DIAGNOSIS — R079 Chest pain, unspecified: Secondary | ICD-10-CM

## 2012-06-17 ENCOUNTER — Telehealth: Payer: Self-pay

## 2012-06-17 NOTE — Telephone Encounter (Signed)
Pt advised of lab results and states she has been using a back brace which has helped with getting in and out of bed or up from a chair. Is it okay for pt to use this? Please advise.

## 2012-06-17 NOTE — Telephone Encounter (Signed)
Yes if it helps pain

## 2012-06-17 NOTE — Telephone Encounter (Signed)
Pt advised.

## 2012-06-21 ENCOUNTER — Other Ambulatory Visit: Payer: Self-pay | Admitting: Internal Medicine

## 2012-07-02 ENCOUNTER — Other Ambulatory Visit: Payer: Self-pay | Admitting: Internal Medicine

## 2012-07-16 ENCOUNTER — Other Ambulatory Visit: Payer: Self-pay | Admitting: Internal Medicine

## 2012-07-26 ENCOUNTER — Other Ambulatory Visit: Payer: Self-pay | Admitting: Internal Medicine

## 2012-08-05 ENCOUNTER — Other Ambulatory Visit: Payer: Self-pay | Admitting: Internal Medicine

## 2012-08-05 ENCOUNTER — Encounter: Payer: Self-pay | Admitting: Internal Medicine

## 2012-08-16 ENCOUNTER — Encounter: Payer: Self-pay | Admitting: Internal Medicine

## 2012-08-16 NOTE — Telephone Encounter (Signed)
Error

## 2012-08-19 ENCOUNTER — Encounter: Payer: Self-pay | Admitting: Internal Medicine

## 2012-08-21 ENCOUNTER — Telehealth: Payer: Self-pay | Admitting: Internal Medicine

## 2012-08-21 NOTE — Telephone Encounter (Signed)
Left a message for patient to call me. 

## 2012-08-22 NOTE — Telephone Encounter (Signed)
Spoke with patient and rescheduled her with Mike Gip, PA  On 09/03/12 at 1:30 PM.

## 2012-09-02 ENCOUNTER — Encounter: Payer: Self-pay | Admitting: *Deleted

## 2012-09-03 ENCOUNTER — Ambulatory Visit (INDEPENDENT_AMBULATORY_CARE_PROVIDER_SITE_OTHER): Payer: 59 | Admitting: Physician Assistant

## 2012-09-03 ENCOUNTER — Encounter: Payer: Self-pay | Admitting: Physician Assistant

## 2012-09-03 VITALS — BP 110/74 | HR 80 | Ht 64.0 in | Wt 229.6 lb

## 2012-09-03 DIAGNOSIS — R194 Change in bowel habit: Secondary | ICD-10-CM

## 2012-09-03 DIAGNOSIS — R198 Other specified symptoms and signs involving the digestive system and abdomen: Secondary | ICD-10-CM

## 2012-09-03 DIAGNOSIS — K649 Unspecified hemorrhoids: Secondary | ICD-10-CM

## 2012-09-03 DIAGNOSIS — K589 Irritable bowel syndrome without diarrhea: Secondary | ICD-10-CM

## 2012-09-03 MED ORDER — ALIGN PO CAPS: 1.0000 | ORAL_CAPSULE | Freq: Every day | ORAL | Status: DC

## 2012-09-03 MED ORDER — MOVIPREP 100 G PO SOLR: 1.0000 | Freq: Once | ORAL | Status: DC

## 2012-09-03 NOTE — Progress Notes (Signed)
Reviewed. Since it may take a while before her colonoscopy, would try Bentyl 20 mg po bid presuming IBS

## 2012-09-03 NOTE — Patient Instructions (Addendum)
You have been scheduled for a colonoscopy with propofol. Please follow written instructions given to you at your visit today.  Please pick up your prep kit at the pharmacy within the next 1-3 days. If you use inhalers (even only as needed), please bring them with you on the day of your procedure.  We have sent the following medications to your pharmacy for you to pick up at your convenience: Moviprep  We have given you samples of Align. This puts good bacteria back into your colon. You should take 1 capsule by mouth once daily for a month. If this works well for you, it can be purchased over the counter.

## 2012-09-03 NOTE — Progress Notes (Signed)
Subjective:    Patient ID: Destiny Tucker, female    DOB: 1956-06-18, 56 y.o.   MRN: [id number]  HPI [name] is a 56 year old female known to Dr. Lina Sar. She has history of constipation and internal hemorrhoids. Also with history of adult onset diabetes mellitus hyperlipidemia and depression. She had colonoscopy per Dr. Russella Dar in 2002 which was a negative exam with the exception of internal hemorrhoids. She had been seen in the office by Dr. Juanda Chance  Since. She comes in today to schedule a followup colonoscopy, and also with complaints of difficulty evacuating her bowels. She also complains of occasional episodes of incontinence of stool and frequent abdominal bloating especially postprandially. She says normally if she has a bowel movement for stool will be normal and then later will feel that she has pressure urgency for bowel movement again but has difficulty evacuating the stool and then if she does have a bowel movement difficulty getting cleaned because the stool is soft. She had one episode with just a small amount of blood on the tissue within the past couple of weeks. She is on oral diabetic meds but says she's been on these for some time the only thing new is Levemir she says her GI symptoms started prior to starting the Levemir. She's currently trying to get off of soda and artificial sweeteners.    Review of Systems  Constitutional: Negative.   HENT: Negative.   Eyes: Negative.   Respiratory: Negative.   Cardiovascular: Negative.   Gastrointestinal: Positive for constipation, blood in stool and abdominal distention.  Musculoskeletal: Negative.   Neurological: Negative.   Hematological: Negative.   Psychiatric/Behavioral: Negative.    Outpatient Encounter Prescriptions as of 09/03/2012  Medication Sig Dispense Refill  . ASPIRIN LOW DOSE 81 MG EC tablet TAKE 1 TABLET BY MOUTH ONCE DAILY  90 tablet  1  . Calcium-Magnesium-Vitamin D (CITRACAL CALCIUM+D) 600-40-500 MG-MG-UNIT TB24  Take by mouth daily.        . clonazePAM (KLONOPIN) 1 MG tablet TAKE 1 TABLET BY MOUTH TWICE A DAY AS NEEDED FOR ANXIETY  180 tablet  1  . docusate sodium (COLACE) 100 MG capsule Take 100 mg by mouth daily.        [name] exenatide (BYETTA 10 MCG PEN) 10 MCG/0.04ML SOLN Inject into the skin 2 (two) times daily with a meal.      . fenofibrate micronized (LOFIBRA) 134 MG capsule TAKE 1 CAPSULE BY MOUTH ONCE DAILY  90 capsule  1  . glimepiride (AMARYL) 2 MG tablet Take 2 mg by mouth daily before breakfast.      . glucose blood (ONE TOUCH ULTRA TEST) test strip Use as instructed Test up tho three times a day. Dx 250.00  300 each  1  . insulin detemir (LEVEMIR) 100 UNIT/ML injection Inject into the skin at bedtime.      . metFORMIN (GLUCOPHAGE) 1000 MG tablet Take 1 tablet (1,000 mg total) by mouth daily with breakfast.  90 tablet  3  . NOVOTWIST 32G X 5 MM MISC USE AS DIRECTED TWICE A DAY WITH BYETTA  200 each  1  . ONETOUCH DELICA LANCETS MISC TEST UP TO 3 TIMES DAILY  300 each  3  . sertraline (ZOLOFT) 100 MG tablet TAKE 1 TABLET BY MOUTH DAILY  90 tablet  1  . simvastatin (ZOCOR) 20 MG tablet TAKE 1 TABLET (20 MG TOTAL) BY MOUTH AT BEDTIME.  90 tablet  3  . triamterene-hydrochlorothiazide (DYAZIDE) 37.5-25 MG per capsule  Take 1 each (1 capsule total) by mouth every morning.  90 capsule  1  . bifidobacterium infantis (ALIGN) capsule Take 1 capsule by mouth daily.  21 capsule  0  . hydrocortisone (ANUSOL-HC) 25 MG suppository Place 25 mg rectally at bedtime as needed.        [name] MOVIPREP 100 G SOLR Take 1 kit (100 g total) by mouth once.  1 kit  0   Allergies  Allergen Reactions  . Codeine    Patient Active Problem List  Diagnosis  . DIABETES MELLITUS, TYPE II  . HYPERLIPIDEMIA  . ANXIETY  . DEPRESSION  . HYPERTENSION  . HEMORRHOIDS, INTERNAL  . ALLERGIC RHINITIS  . OSTEOPENIA  . TOBACCO USE, QUIT  . HYSTEROSCOPY, HX OF   History   Social History  . Marital Status: Married    Spouse Name:  N/A    Number of Children: N/A  . Years of Education: N/A   Occupational History  . Communications    Social History Main Topics  . Smoking status: Former Smoker    Quit date: 02/18/2010  . Smokeless tobacco: Not on file   Comment: Married, lives with spouse. employed by [employer], television/film set  . Alcohol Use: Yes     Occassional  . Drug Use: No  . Sexually Active:    Other Topics Concern  . Not on file   Social History Narrative  . No narrative on file       Objective:   Physical Exam well-developed white female in no acute distress blood pressure 110/74 pulse 80 height 5 foot 4 weight 229 3 HEENT; nontraumatic normocephalic EOMI PERRLA sclera anicteric, Neck ;supple no JVD, Cardiovascular; regular rate and rhythm with S1-S2 no murmur or gallop, Pulmonary; clear bilaterally, Abdomen; sof,t no focal tenderness, no guarding or rebound no palpable mass left lobe of the liver is palpable ,bowel sounds are active, Rectal; exam not done, Extremities; no clubbing cyanosis or edema skin warm dry, Psych; mood and affect normal and appropriate.        Assessment & Plan:  #76 56 year old female for followup colon neoplasia screening #2 change in bowel habits with frequent abdominal bloating and difficulty evacuating bowels which I suspect is functional, or perhaps due to some pelvic floor dysfunction. #3 adult-onset diabetes mellitus #4 history of internal hemorrhoids #5 hyperlipidemia  Plan; schedule for colonoscopy with Dr. Lina Sar, procedure was discussed in detail with the patient and she is agreeable to proceed Will give her a trial of a line one by mouth daily for a month if helpful she can continue Further plans depending on results of colonoscopy

## 2012-09-09 ENCOUNTER — Ambulatory Visit (INDEPENDENT_AMBULATORY_CARE_PROVIDER_SITE_OTHER): Payer: 59 | Admitting: Licensed Clinical Social Worker

## 2012-09-09 DIAGNOSIS — F4323 Adjustment disorder with mixed anxiety and depressed mood: Secondary | ICD-10-CM

## 2012-09-20 ENCOUNTER — Ambulatory Visit: Payer: 59 | Admitting: Licensed Clinical Social Worker

## 2012-10-02 ENCOUNTER — Telehealth: Payer: Self-pay | Admitting: Internal Medicine

## 2012-10-03 NOTE — Telephone Encounter (Signed)
The patient wanted to be sure Amy put in her office note that Chalonda has internal hemorrhoids and that Dr. Juanda Chance will know that.  I assured her that it is in the office note in several places.  She also asked if I would reprint her instructions.  I told her I already printed it and will put them upfront for her in an envelope.  She said thank you.

## 2012-10-07 ENCOUNTER — Ambulatory Visit (INDEPENDENT_AMBULATORY_CARE_PROVIDER_SITE_OTHER): Payer: 59 | Admitting: Licensed Clinical Social Worker

## 2012-10-07 DIAGNOSIS — F4323 Adjustment disorder with mixed anxiety and depressed mood: Secondary | ICD-10-CM

## 2012-10-09 ENCOUNTER — Telehealth: Payer: Self-pay | Admitting: Internal Medicine

## 2012-10-09 NOTE — Telephone Encounter (Signed)
Pt called back and decided to keep her appt. For 10/11/12 @11 :30a.m.  Said her sister's were going to take care of her mother so she can get her procedure done

## 2012-10-09 NOTE — Telephone Encounter (Signed)
See previous message

## 2012-10-11 ENCOUNTER — Encounter: Payer: Self-pay | Admitting: Internal Medicine

## 2012-10-11 ENCOUNTER — Encounter: Payer: 59 | Admitting: Internal Medicine

## 2012-10-11 ENCOUNTER — Ambulatory Visit (AMBULATORY_SURGERY_CENTER): Payer: 59 | Admitting: Internal Medicine

## 2012-10-11 VITALS — BP 126/78 | HR 86 | Temp 97.3°F | Resp 19 | Ht 65.0 in | Wt 228.0 lb

## 2012-10-11 DIAGNOSIS — D126 Benign neoplasm of colon, unspecified: Secondary | ICD-10-CM

## 2012-10-11 DIAGNOSIS — K649 Unspecified hemorrhoids: Secondary | ICD-10-CM

## 2012-10-11 DIAGNOSIS — K589 Irritable bowel syndrome without diarrhea: Secondary | ICD-10-CM

## 2012-10-11 DIAGNOSIS — Z1211 Encounter for screening for malignant neoplasm of colon: Secondary | ICD-10-CM

## 2012-10-11 DIAGNOSIS — R198 Other specified symptoms and signs involving the digestive system and abdomen: Secondary | ICD-10-CM

## 2012-10-11 MED ORDER — SODIUM CHLORIDE 0.9 % IV SOLN: 500.0000 mL | INTRAVENOUS | Status: DC

## 2012-10-11 NOTE — Progress Notes (Signed)
Patient did not experience any of the following events: a burn prior to discharge; a fall within the facility; wrong site/side/patient/procedure/implant event; or a hospital transfer or hospital admission upon discharge from the facility. (G8907) Patient did not have preoperative order for IV antibiotic SSI prophylaxis. (G8918)  

## 2012-10-11 NOTE — Patient Instructions (Addendum)
YOU HAD AN ENDOSCOPIC PROCEDURE TODAY AT [facility]: Refer to the procedure report that was given to you for any specific questions about what was found during the examination.  If the procedure report does not answer your questions, please call your gastroenterologist to clarify.  If you requested that your care partner not be given the details of your procedure findings, then the procedure report has been included in a sealed envelope for you to review at your convenience later.  YOU SHOULD EXPECT: Some feelings of bloating in the abdomen. Passage of more gas than usual.  Walking can help get rid of the air that was put into your GI tract during the procedure and reduce the bloating. If you had a lower endoscopy (such as a colonoscopy or flexible sigmoidoscopy) you may notice spotting of blood in your stool or on the toilet paper. If you underwent a bowel prep for your procedure, then you may not have a normal bowel movement for a few days.  DIET: Your first meal following the procedure should be a light meal and then it is ok to progress to your normal diet.  A half-sandwich or bowl of soup is an example of a good first meal.  Heavy or fried foods are harder to digest and may make you feel nauseous or bloated.  Likewise meals heavy in dairy and vegetables can cause extra gas to form and this can also increase the bloating.  Drink plenty of fluids but you should avoid alcoholic beverages for 24 hours.  ACTIVITY: Your care partner should take you home directly after the procedure.  You should plan to take it easy, moving slowly for the rest of the day.  You can resume normal activity the day after the procedure however you should NOT DRIVE or use heavy machinery for 24 hours (because of the sedation medicines used during the test).    SYMPTOMS TO REPORT IMMEDIATELY: A gastroenterologist can be reached at any hour.  During normal business hours, 8:30 AM to 5:00 PM Monday through Friday,  call [phone].  After hours and on weekends, please call the GI answering service at [phone] who will take a message and have the physician on call contact you.   Following lower endoscopy (colonoscopy or flexible sigmoidoscopy):  Excessive amounts of blood in the stool  Significant tenderness or worsening of abdominal pains  Swelling of the abdomen that is new, acute  Fever of 100F or higher s  FOLLOW UP: If any biopsies were taken you will be contacted by phone or by letter within the next 1-3 weeks.  Call your gastroenterologist if you have not heard about the biopsies in 3 weeks.  Our staff will call the home number listed on your records the next business day following your procedure to check on you and address any questions or concerns that you may have at that time regarding the information given to you following your procedure. This is a courtesy call and so if there is no answer at the home number and we have not heard from you through the emergency physician on call, we will assume that you have returned to your regular daily activities without incident.  SIGNATURES/CONFIDENTIALITY: You and/or your care partner have signed paperwork which will be entered into your electronic medical record.  These signatures attest to the fact that that the information above on your After Visit Summary has been reviewed and is understood.  Full responsibility of the confidentiality of  this discharge information lies with you and/or your care-partner.    Diverticulosis, and high fiber diet information given.

## 2012-10-11 NOTE — Progress Notes (Signed)
Pt. States she finished prep at 0900, had sip of gatorade at 0930, sip of water at 1000, and sip of water at 1020.  Dr. Juanda Chance and Annette Stable , CRNA Advised.  Pt. Advised for future reference to stop drinking anything at time given on instructions.   She verbalized understanding.

## 2012-10-11 NOTE — Op Note (Signed)
[facility] [address] [place], [address]   COLONOSCOPY PROCEDURE REPORT  PATIENT: Destiny, Tucker  MR#: [id number] BIRTHDATE: 02/06/1956 , 56  yrs. old GENDER: Female ENDOSCOPIST: Hart Carwin, MD REFERRED BY:  Rene Paci, M.D. PROCEDURE DATE:  10/11/2012 PROCEDURE:   Colonoscopy, diagnostic and Colonoscopy, screening ASA CLASS:   Class II INDICATIONS:Change in bowel habits, Average risk patient for colon cancer, and last colon 2002. MEDICATIONS: MAC sedation, administered by CRNA and Propofol (Diprivan) 140 mg IV  DESCRIPTION OF PROCEDURE:   After the risks and benefits and of the procedure were explained, informed consent was obtained.  A digital rectal exam revealed no abnormalities of the rectum.    The LB [id number]  endoscope was introduced through the anus and advanced to the cecum, which was identified by both the appendix and ileocecal valve .  The quality of the prep was good, using MoviPrep .  The instrument was then slowly withdrawn as the colon was fully examined.     COLON FINDINGS: Mild diverticulosis was noted.     Retroflexed views revealed no abnormalities.     The scope was then withdrawn from the patient and the procedure completed.  COMPLICATIONS: There were no complications. ENDOSCOPIC IMPRESSION: Mild diverticulosis was noted s/p random biopsies to r/o microscopic colitis  RECOMMENDATIONS: 1.  Await pathology results 2.  High fiber diet I suspect an IBS causing change in bowl habits  REPEAT EXAM: 10 years  cc:  _______________________________ eSignedHart Carwin, MD 10/11/2012 12:23 PM

## 2012-10-14 ENCOUNTER — Telehealth: Payer: Self-pay | Admitting: *Deleted

## 2012-10-14 NOTE — Telephone Encounter (Signed)
  Follow up Call-  Call back number 10/11/2012  Post procedure Call Back phone  # [phone]  Permission to leave phone message Yes     Patient questions:  Do you have a fever, pain , or abdominal swelling? no Pain Score  0 *  Have you tolerated food without any problems? yes  Have you been able to return to your normal activities? yes  Do you have any questions about your discharge instructions: Diet   no Medications  no Follow up visit  no  Do you have questions or concerns about your Care? no  Actions: * If pain score is 4 or above: No action needed, pain <4.

## 2012-10-16 ENCOUNTER — Encounter: Payer: Self-pay | Admitting: Internal Medicine

## 2012-10-16 ENCOUNTER — Ambulatory Visit: Payer: 59 | Admitting: Internal Medicine

## 2012-11-05 ENCOUNTER — Other Ambulatory Visit: Payer: Self-pay | Admitting: Internal Medicine

## 2012-11-05 ENCOUNTER — Other Ambulatory Visit: Payer: Self-pay | Admitting: *Deleted

## 2012-11-05 MED ORDER — GLUCOSE BLOOD VI STRP: ORAL_STRIP | Status: DC

## 2012-11-05 MED ORDER — ONETOUCH DELICA LANCETS MISC: Status: DC

## 2012-12-02 ENCOUNTER — Other Ambulatory Visit: Payer: Self-pay | Admitting: Internal Medicine

## 2012-12-19 ENCOUNTER — Other Ambulatory Visit: Payer: Self-pay | Admitting: *Deleted

## 2012-12-19 MED ORDER — INSULIN PEN NEEDLE 32G X 5 MM MISC: Status: DC

## 2013-01-10 ENCOUNTER — Other Ambulatory Visit: Payer: Self-pay | Admitting: Internal Medicine

## 2013-01-10 ENCOUNTER — Other Ambulatory Visit: Payer: Self-pay | Admitting: *Deleted

## 2013-01-10 MED ORDER — INSULIN PEN NEEDLE 32G X 6 MM MISC: Status: DC

## 2013-01-10 NOTE — Telephone Encounter (Signed)
Received fax stating pt will need new rx for pen needles for levemir flexpen & victoza. The novotwist does not fit levemir pen. Need BD or Novofine...Raechel Chute

## 2013-01-22 ENCOUNTER — Other Ambulatory Visit: Payer: Self-pay | Admitting: Internal Medicine

## 2013-02-26 ENCOUNTER — Other Ambulatory Visit: Payer: Self-pay | Admitting: Internal Medicine

## 2013-02-28 ENCOUNTER — Encounter: Payer: Self-pay | Admitting: Internal Medicine

## 2013-04-08 ENCOUNTER — Other Ambulatory Visit: Payer: Self-pay | Admitting: Internal Medicine

## 2013-04-08 NOTE — Telephone Encounter (Signed)
Faxed script back to ...[facility] 

## 2013-05-29 ENCOUNTER — Other Ambulatory Visit: Payer: Self-pay | Admitting: Internal Medicine

## 2013-06-09 ENCOUNTER — Other Ambulatory Visit: Payer: Self-pay | Admitting: Endocrinology

## 2013-06-09 NOTE — Telephone Encounter (Signed)
Rx request to pharmacy per provider verbal Ok/SLS  

## 2013-06-16 ENCOUNTER — Other Ambulatory Visit: Payer: Self-pay | Admitting: Internal Medicine

## 2013-06-20 ENCOUNTER — Telehealth: Payer: Self-pay | Admitting: *Deleted

## 2013-06-20 NOTE — Telephone Encounter (Signed)
Pt called states she is having hot flashes. She further states she is not interested in taking hormone replacements.  Please advise.

## 2013-06-20 NOTE — Telephone Encounter (Signed)
Spoke with pt.  Advised of note.

## 2013-06-20 NOTE — Telephone Encounter (Signed)
Can try black cohosh OTC which is a herbal supplement if she does not want HRT, if that does work, we can try effexor which is an antidepressant medication.

## 2013-06-26 ENCOUNTER — Telehealth: Payer: Self-pay | Admitting: *Deleted

## 2013-06-26 NOTE — Telephone Encounter (Signed)
I would prefer to wait till her next visit and review lab work before increase insulin

## 2013-06-26 NOTE — Telephone Encounter (Signed)
Patients blood sugar readings have been 8/1 152, 8/2-169, 8/3-179, 8/4-143, 8/5-178, on 7/31 it was 228 and her 30 day average was 148, she wants to know if it's okay to increase her insulin some because she feels these readings have been higher than normal.

## 2013-06-27 NOTE — Telephone Encounter (Signed)
Left message on pt's voice-mail to schedule appt.

## 2013-07-08 ENCOUNTER — Other Ambulatory Visit: Payer: Self-pay | Admitting: Endocrinology

## 2013-07-09 ENCOUNTER — Other Ambulatory Visit: Payer: Self-pay | Admitting: Internal Medicine

## 2013-07-22 ENCOUNTER — Other Ambulatory Visit: Payer: Self-pay | Admitting: Internal Medicine

## 2013-07-25 ENCOUNTER — Telehealth: Payer: Self-pay | Admitting: *Deleted

## 2013-07-25 NOTE — Telephone Encounter (Signed)
She should be seen soon, and discussed the third of medications when she comes in but can reduce the dose by 500 mg in the meantime

## 2013-07-25 NOTE — Telephone Encounter (Signed)
Pt saw you in June or July, she wants to know when you want her to see you again. She also wants to know if there is something else she can take besides metformin, she's having a lot of stomach issues.

## 2013-07-28 NOTE — Telephone Encounter (Signed)
Correction to previous note: Will discuss her medication regimen when she comes in, can reduce or stop her metformin if still having stomach problems

## 2013-07-31 ENCOUNTER — Other Ambulatory Visit: Payer: Self-pay | Admitting: Dermatology

## 2013-08-11 ENCOUNTER — Telehealth: Payer: Self-pay | Admitting: Endocrinology

## 2013-08-11 NOTE — Telephone Encounter (Signed)
Please advise 

## 2013-08-12 NOTE — Telephone Encounter (Signed)
Spoke with patient and she understands to stay on her current dose

## 2013-08-12 NOTE — Telephone Encounter (Signed)
Let her know current dosage

## 2013-08-18 ENCOUNTER — Ambulatory Visit: Payer: 59 | Admitting: Internal Medicine

## 2013-08-25 ENCOUNTER — Other Ambulatory Visit: Payer: Self-pay | Admitting: Internal Medicine

## 2013-08-26 ENCOUNTER — Other Ambulatory Visit: Payer: Self-pay | Admitting: Internal Medicine

## 2013-09-09 ENCOUNTER — Other Ambulatory Visit: Payer: Self-pay | Admitting: Endocrinology

## 2013-09-12 ENCOUNTER — Other Ambulatory Visit: Payer: Self-pay | Admitting: Internal Medicine

## 2013-09-12 ENCOUNTER — Other Ambulatory Visit: Payer: Self-pay | Admitting: *Deleted

## 2013-09-12 MED ORDER — SIMVASTATIN 20 MG PO TABS: ORAL_TABLET | ORAL | Status: DC

## 2013-09-12 NOTE — Telephone Encounter (Signed)
Received fax stating pt has made appt for 10/03/13 for cpx. Wanting to refill refill on her simvastatin until she see md...[facility]

## 2013-09-19 ENCOUNTER — Encounter: Payer: 59 | Admitting: Internal Medicine

## 2013-09-29 ENCOUNTER — Other Ambulatory Visit: Payer: Self-pay | Admitting: Internal Medicine

## 2013-10-01 ENCOUNTER — Other Ambulatory Visit (INDEPENDENT_AMBULATORY_CARE_PROVIDER_SITE_OTHER): Payer: 59

## 2013-10-01 ENCOUNTER — Other Ambulatory Visit: Payer: Self-pay | Admitting: *Deleted

## 2013-10-01 DIAGNOSIS — E785 Hyperlipidemia, unspecified: Secondary | ICD-10-CM

## 2013-10-01 DIAGNOSIS — E119 Type 2 diabetes mellitus without complications: Secondary | ICD-10-CM

## 2013-10-01 LAB — URINALYSIS
Hgb urine dipstick: NEGATIVE
Leukocytes, UA: NEGATIVE
Nitrite: NEGATIVE
Specific Gravity, Urine: 1.02 (ref 1.000–1.030)
Urine Glucose: >=1000
Urobilinogen, UA: 0.2 (ref 0.0–1.0)
pH: 7 (ref 5.0–8.0)

## 2013-10-01 LAB — COMPREHENSIVE METABOLIC PANEL
ALT: 32 U/L (ref 0–35)
AST: 24 U/L (ref 0–37)
Albumin: 4.2 g/dL (ref 3.5–5.2)
Alkaline Phosphatase: 74 U/L (ref 39–117)
BUN: 14 mg/dL (ref 6–23)
Chloride: 100 mEq/L (ref 96–112)
Glucose, Bld: 243 mg/dL — ABNORMAL HIGH (ref 70–99)
Sodium: 135 mEq/L (ref 135–145)
Total Bilirubin: 0.4 mg/dL (ref 0.3–1.2)
Total Protein: 7.8 g/dL (ref 6.0–8.3)

## 2013-10-01 LAB — MICROALBUMIN / CREATININE URINE RATIO
Creatinine,U: 34.9 mg/dL
Microalb, Ur: 0.3 mg/dL (ref 0.0–1.9)

## 2013-10-01 LAB — LIPID PANEL: Cholesterol: 124 mg/dL (ref 0–200)

## 2013-10-02 ENCOUNTER — Other Ambulatory Visit: Payer: 59

## 2013-10-03 ENCOUNTER — Ambulatory Visit (INDEPENDENT_AMBULATORY_CARE_PROVIDER_SITE_OTHER): Payer: 59 | Admitting: Internal Medicine

## 2013-10-03 ENCOUNTER — Encounter: Payer: Self-pay | Admitting: Internal Medicine

## 2013-10-03 VITALS — BP 120/82 | HR 104 | Temp 99.2°F | Wt 231.4 lb

## 2013-10-03 DIAGNOSIS — E119 Type 2 diabetes mellitus without complications: Secondary | ICD-10-CM

## 2013-10-03 DIAGNOSIS — Z23 Encounter for immunization: Secondary | ICD-10-CM

## 2013-10-03 DIAGNOSIS — E785 Hyperlipidemia, unspecified: Secondary | ICD-10-CM

## 2013-10-03 DIAGNOSIS — F411 Generalized anxiety disorder: Secondary | ICD-10-CM

## 2013-10-03 DIAGNOSIS — Z0000 Encounter for general adult medical examination without abnormal findings: Secondary | ICD-10-CM

## 2013-10-03 MED ORDER — TRIAMTERENE-HCTZ 37.5-25 MG PO CAPS: ORAL_CAPSULE | ORAL | Status: DC

## 2013-10-03 MED ORDER — SERTRALINE HCL 100 MG PO TABS: ORAL_TABLET | ORAL | Status: DC

## 2013-10-03 MED ORDER — FENOFIBRATE MICRONIZED 134 MG PO CAPS: ORAL_CAPSULE | ORAL | Status: DC

## 2013-10-03 MED ORDER — CLONAZEPAM 1 MG PO TABS: ORAL_TABLET | ORAL | Status: DC

## 2013-10-03 NOTE — Assessment & Plan Note (Signed)
On statin and fenofibrate Check annually The current medical regimen is effective;  continue present plan and medications.

## 2013-10-03 NOTE — Assessment & Plan Note (Signed)
Following with endo Lucianne Muss) - med changes reviewed The current medical regimen is effective;  continue present plan and medications.  Lab Results  Component Value Date   HGBA1C 7.9* 10/01/2013   HGBA1C 8.7* 06/15/2011   HGBA1C 8.3* 02/15/2011   Lab Results  Component Value Date   MICROALBUR 0.3 10/01/2013   LDLCALC 9 03/16/2010   CREATININE 0.7 10/01/2013

## 2013-10-03 NOTE — Patient Instructions (Addendum)
It was good to see you today.  We have reviewed your prior records including labs and tests today  Health Maintenance reviewed - pneumovax ordered today - will consider Shingles vaccine when you are 59 -all other recommended immunizations and age-appropriate screenings are up-to-date.  Medications reviewed and updated, no changes recommended at this time. Refill on medication(s) as discussed today.  Please schedule followup in 12 months for annual physical - every 6 months with Dr Lucianne Muss - call sooner if problems.  Health Maintenance, Female A healthy lifestyle and preventative care can promote health and wellness.  Maintain regular health, dental, and eye exams.  Eat a healthy diet. Foods like vegetables, fruits, whole grains, low-fat dairy products, and lean protein foods contain the nutrients you need without too many calories. Decrease your intake of foods high in solid fats, added sugars, and salt. Get information about a proper diet from your caregiver, if necessary.  Regular physical exercise is one of the most important things you can do for your health. Most adults should get at least 150 minutes of moderate-intensity exercise (any activity that increases your heart rate and causes you to sweat) each week. In addition, most adults need muscle-strengthening exercises on 2 or more days a week.   Maintain a healthy weight. The body mass index (BMI) is a screening tool to identify possible weight problems. It provides an estimate of body fat based on height and weight. Your caregiver can help determine your BMI, and can help you achieve or maintain a healthy weight. For adults 20 years and older:  A BMI below 18.5 is considered underweight.  A BMI of 18.5 to 24.9 is normal.  A BMI of 25 to 29.9 is considered overweight.  A BMI of 30 and above is considered obese.  Maintain normal blood lipids and cholesterol by exercising and minimizing your intake of saturated fat. Eat a balanced  diet with plenty of fruits and vegetables. Blood tests for lipids and cholesterol should begin at age 22 and be repeated every 5 years. If your lipid or cholesterol levels are high, you are over 50, or you are a high risk for heart disease, you may need your cholesterol levels checked more frequently.Ongoing high lipid and cholesterol levels should be treated with medicines if diet and exercise are not effective.  If you smoke, find out from your caregiver how to quit. If you do not use tobacco, do not start.  Lung cancer screening is recommended for adults aged 60 80 years who are at high risk for developing lung cancer because of a history of smoking. Yearly low-dose computed tomography (CT) is recommended for people who have at least a 30-pack-year history of smoking and are a current smoker or have quit within the past 15 years. A pack year of smoking is smoking an average of 1 pack of cigarettes a day for 1 year (for example: 1 pack a day for 30 years or 2 packs a day for 15 years). Yearly screening should continue until the smoker has stopped smoking for at least 15 years. Yearly screening should also be stopped for people who develop a health problem that would prevent them from having lung cancer treatment.  If you are pregnant, do not drink alcohol. If you are breastfeeding, be very cautious about drinking alcohol. If you are not pregnant and choose to drink alcohol, do not exceed 1 drink per day. One drink is considered to be 12 ounces (355 mL) of beer, 5 ounces (148  mL) of wine, or 1.5 ounces (44 mL) of liquor.  Avoid use of street drugs. Do not share needles with anyone. Ask for help if you need support or instructions about stopping the use of drugs.  High blood pressure causes heart disease and increases the risk of stroke. Blood pressure should be checked at least every 1 to 2 years. Ongoing high blood pressure should be treated with medicines, if weight loss and exercise are not  effective.  If you are 44 to 58 years old, ask your caregiver if you should take aspirin to prevent strokes.  Diabetes screening involves taking a blood sample to check your fasting blood sugar level. This should be done once every 3 years, after age 54, if you are within normal weight and without risk factors for diabetes. Testing should be considered at a younger age or be carried out more frequently if you are overweight and have at least 1 risk factor for diabetes.  Breast cancer screening is essential preventative care for women. You should practice "breast self-awareness." This means understanding the normal appearance and feel of your breasts and may include breast self-examination. Any changes detected, no matter how small, should be reported to a caregiver. Women in their 15s and 30s should have a clinical breast exam (CBE) by a caregiver as part of a regular health exam every 1 to 3 years. After age 84, women should have a CBE every year. Starting at age 54, women should consider having a mammogram (breast X-ray) every year. Women who have a family history of breast cancer should talk to their caregiver about genetic screening. Women at a high risk of breast cancer should talk to their caregiver about having an MRI and a mammogram every year.  Breast cancer gene (BRCA)-related cancer risk assessment is recommended for women who have family members with BRCA-related cancers. BRCA-related cancers include breast, ovarian, tubal, and peritoneal cancers. Having family members with these cancers may be associated with an increased risk for harmful changes (mutations) in the breast cancer genes BRCA1 and BRCA2. Results of the assessment will determine the need for genetic counseling and BRCA1 and BRCA2 testing.  The Pap test is a screening test for cervical cancer. Women should have a Pap test starting at age 37. Between ages 62 and 61, Pap tests should be repeated every 2 years. Beginning at age 37,  you should have a Pap test every 3 years as long as the past 3 Pap tests have been normal. If you had a hysterectomy for a problem that was not cancer or a condition that could lead to cancer, then you no longer need Pap tests. If you are between ages 67 and 80, and you have had normal Pap tests going back 10 years, you no longer need Pap tests. If you have had past treatment for cervical cancer or a condition that could lead to cancer, you need Pap tests and screening for cancer for at least 20 years after your treatment. If Pap tests have been discontinued, risk factors (such as a new sexual partner) need to be reassessed to determine if screening should be resumed. Some women have medical problems that increase the chance of getting cervical cancer. In these cases, your caregiver may recommend more frequent screening and Pap tests.  The human papillomavirus (HPV) test is an additional test that may be used for cervical cancer screening. The HPV test looks for the virus that can cause the cell changes on the cervix. The cells  collected during the Pap test can be tested for HPV. The HPV test could be used to screen women aged 36 years and older, and should be used in women of any age who have unclear Pap test results. After the age of 35, women should have HPV testing at the same frequency as a Pap test.  Colorectal cancer can be detected and often prevented. Most routine colorectal cancer screening begins at the age of 95 and continues through age 79. However, your caregiver may recommend screening at an earlier age if you have risk factors for colon cancer. On a yearly basis, your caregiver may provide home test kits to check for hidden blood in the stool. Use of a small camera at the end of a tube, to directly examine the colon (sigmoidoscopy or colonoscopy), can detect the earliest forms of colorectal cancer. Talk to your caregiver about this at age 50, when routine screening begins. Direct examination of  the colon should be repeated every 5 to 10 years through age 37, unless early forms of pre-cancerous polyps or small growths are found.  Hepatitis C blood testing is recommended for all people born from [birth year] through 1965 and any individual with known risks for hepatitis C.  Practice safe sex. Use condoms and avoid high-risk sexual practices to reduce the spread of sexually transmitted infections (STIs). Sexually active women aged 71 and younger should be checked for Chlamydia, which is a common sexually transmitted infection. Older women with new or multiple partners should also be tested for Chlamydia. Testing for other STIs is recommended if you are sexually active and at increased risk.  Osteoporosis is a disease in which the bones lose minerals and strength with aging. This can result in serious bone fractures. The risk of osteoporosis can be identified using a bone density scan. Women ages 32 and over and women at risk for fractures or osteoporosis should discuss screening with their caregivers. Ask your caregiver whether you should be taking a calcium supplement or vitamin D to reduce the rate of osteoporosis.  Menopause can be associated with physical symptoms and risks. Hormone replacement therapy is available to decrease symptoms and risks. You should talk to your caregiver about whether hormone replacement therapy is right for you.  Use sunscreen. Apply sunscreen liberally and repeatedly throughout the day. You should seek shade when your shadow is shorter than you. Protect yourself by wearing long sleeves, pants, a wide-brimmed hat, and sunglasses year round, whenever you are outdoors.  Notify your caregiver of new moles or changes in moles, especially if there is a change in shape or color. Also notify your caregiver if a mole is larger than the size of a pencil eraser.  Stay current with your immunizations. Document Released: 05/22/2011 Document Revised: 03/03/2013 Document Reviewed:  05/22/2011 [employer] [facility] Patient Information 2014 [place], [place].

## 2013-10-03 NOTE — Assessment & Plan Note (Signed)
Chronic but stable symptoms Refill BZ (chronicuse of same) and continue sertraline SSRI

## 2013-10-03 NOTE — Progress Notes (Signed)
Subjective:    Patient ID: Destiny Tucker, female    DOB: [birth year], 57 y.o.   MRN: [id number]  HPI  patient is here today for annual physical. Patient feels well and has no complaints.  Also reviewed chronic medical issues and interval medical events  Past Medical History  Diagnosis Date  . OSTEOPENIA   . TOBACCO USE, QUIT   . HYPERTENSION   . HYPERLIPIDEMIA   . DEPRESSION   . ANXIETY   . ALLERGIC RHINITIS   . Diabetes mellitus   . Internal hemorrhoids without mention of complication 09/24/2001    Colonoscopy--Dr. Russella Dar , pts. states hemorrhoids are uncomfortable   Family History  Problem Relation Age of Onset  . Hypertension Mother   . Hyperlipidemia Mother   . Nephrolithiasis Mother   . Hypertension Father   . Hyperlipidemia Father   . Nephrolithiasis Father   . Diabetes Maternal Uncle   . Diabetes Mother    History  Substance Use Topics  . Smoking status: Former Smoker    Quit date: 02/18/2010  . Smokeless tobacco: Not on file     Comment: Married, lives with spouse. employed by [employer], television/film set  . Alcohol Use: Yes     Comment: Occassional    Review of Systems  Constitutional: Negative for fatigue and unexpected weight change.  Respiratory: Negative for cough, shortness of breath and wheezing.   Cardiovascular: Negative for chest pain, palpitations and leg swelling.  Gastrointestinal: Negative for nausea, abdominal pain and diarrhea.  Neurological: Negative for dizziness, weakness, light-headedness and headaches.  Psychiatric/Behavioral: Negative for dysphoric mood. The patient is not nervous/anxious.   All other systems reviewed and are negative.       Objective:   Physical Exam BP 120/82  Pulse 104  Temp(Src) 99.2 F (37.3 C) (Oral)  Wt 231 lb 6.4 oz (104.962 kg)  SpO2 97% Wt Readings from Last 3 Encounters:  10/03/13 231 lb 6.4 oz (104.962 kg)  10/11/12 228 lb (103.42 kg)  09/03/12 229 lb 9.6 oz (104.146 kg)    Constitutional: She is overweight, but appears well-developed and well-nourished. No distress.  HENT: Head: Normocephalic and atraumatic. Ears: B TMs ok, no erythema or effusion; Nose: Nose normal. Mouth/Throat: Oropharynx is clear and moist. No oropharyngeal exudate.  Eyes: Conjunctivae and EOM are normal. Pupils are equal, round, and reactive to light. No scleral icterus.  Neck: Normal range of motion. Neck supple. No JVD present. No thyromegaly present.  Cardiovascular: Normal rate, regular rhythm and normal heart sounds.  No murmur heard. No BLE edema. Pulmonary/Chest: Effort normal and breath sounds normal. No respiratory distress. She has no wheezes.  Abdominal: Soft. Bowel sounds are normal. She exhibits no distension. There is no tenderness. no masses Musculoskeletal: Normal range of motion, no joint effusions. No gross deformities Neurological: She is alert and oriented to person, place, and time. No cranial nerve deficit. Coordination, balance, strength, speech and gait are normal.  Skin: Skin is warm and dry. No rash noted. No erythema.  Psychiatric: She has a normal mood and affect. Her behavior is normal. Judgment and thought content normal.   Lab Results  Component Value Date   GLUCOSE 243* 10/01/2013   CHOL 124 10/01/2013   TRIG 257.0* 10/01/2013   HDL 28.50* 10/01/2013   LDLDIRECT 68.7 10/01/2013   LDLCALC 9 03/16/2010   ALT 32 10/01/2013   AST 24 10/01/2013   NA 135 10/01/2013   K 3.8 10/01/2013   CL 100 10/01/2013   CREATININE 0.7  10/01/2013   BUN 14 10/01/2013   CO2 25 10/01/2013   HGBA1C 7.9* 10/01/2013   MICROALBUR 0.3 10/01/2013       Assessment & Plan:    CPX/v70.0 - Patient has been counseled on age-appropriate routine health concerns for screening and prevention. These are reviewed and up-to-date. Immunizations are up-to-date or declined. Labs reviewed.  Also See problem list. Medications and labs reviewed today.

## 2013-10-03 NOTE — Progress Notes (Signed)
Pre-visit discussion using our clinic review tool. No additional management support is needed unless otherwise documented below in the visit note.  

## 2013-10-06 ENCOUNTER — Telehealth: Payer: Self-pay | Admitting: *Deleted

## 2013-10-06 ENCOUNTER — Ambulatory Visit (INDEPENDENT_AMBULATORY_CARE_PROVIDER_SITE_OTHER): Payer: 59 | Admitting: Endocrinology

## 2013-10-06 ENCOUNTER — Encounter: Payer: Self-pay | Admitting: Endocrinology

## 2013-10-06 VITALS — BP 120/78 | HR 100 | Temp 98.5°F | Resp 14 | Ht 64.0 in | Wt 230.9 lb

## 2013-10-06 DIAGNOSIS — I10 Essential (primary) hypertension: Secondary | ICD-10-CM

## 2013-10-06 DIAGNOSIS — E119 Type 2 diabetes mellitus without complications: Secondary | ICD-10-CM

## 2013-10-06 NOTE — Telephone Encounter (Signed)
[facility] Triage Call Report Triage Record Num: [id number] Operator: Karenann Cai Patient Name: Destiny Tucker Call Date & Time: 10/05/2013 1:29:33PM Patient Phone: [phone] PCP: Rene Paci Patient Gender: Female PCP Fax : [phone] Patient DOB: [birth year] [facility] Name: [facility] Reason for Call: Caller: [name]/Patient; PCP: Rene Paci (Adults only); CB#: [phone]; reason for call: left arm is sore: had injection on 10/03/2013. Afebrile. Injection site: slightly red, slightly swollen. Other symptom: diarrhea x 2 this morning (11/16). Disposition: see Provider within 24 hours due to answering yes to persistent or recurrent symptoms that do not respond to treatment. RN reviewed allergic reaction care advice with patient. Protocol(s) Used: Allergic Reaction, Severe Recommended Outcome per Protocol: See Provider within 24 hours Reason for Outcome: Persistent or recurrent symptoms that do not respond to treatment Care Advice: Nonprescription oral antihistamines (such as Zyrtec, Claritin, Allegra, Benadryl, Allerest, Chlor-Trimetron, etc.) may help relieve symptoms. - Use as directed on package label or by pharmacist. - Antihistamine medication may cause drowsiness and should be taken with caution by adults 75 years or older. Consider a non-sedating antihistamine (Zyrtec, Claritin, or Allegra) available without a prescription. ~ 10/05/2013 1:42:57PM Page 1 of 1 [email or web address]

## 2013-10-06 NOTE — Patient Instructions (Signed)
Please check blood sugars at least half the time about 2 hours after any meal and as directed on waking up.   Please bring blood sugar monitor to each visit  

## 2013-10-06 NOTE — Progress Notes (Signed)
Destiny Tucker is an 57 y.o. female.   Reason for Appointment: Diabetes follow-up   History of Present Illness   Diagnosis: Type 2 DIABETES MELITUS, date of diagnosis:  08/2010     Previous history: She has been on various regimens for her diabetes including Byetta in the past. Because of progressively higher fasting readings she was started on Levemir insulin in 2013 Although her A1c has been usually about 7.3% she tends to have relatively high fasting readings Has been somewhat irregular with her followup over the last year  Recent history: Her blood sugars are overall worse than on her last visit with increasing A1c and given her pre-prandial readings at home are averaging 173 Since she is checking her blood sugar mostly before breakfast and late night not clear if she has postprandial hyperglycemia Her blood sugars averaging about the same at both those times with some variability; however highest readings are > 200 on coming back from work She tends to snack in the evenings at work with variable intake. Has not checked her sugars around her late afternoon lunch but previously these had been fairly good     Oral hypoglycemic drugs: Amaryl hs, Metformin 2g hs      Side effects from medications: None Insulin regimen: Levemir 33 at bedtime               Monitors blood glucose:  1-2 times a day.    Glucometer: One Touch.          Blood Glucose readings from meter download: readings before breakfast: Median 175, range 135-205, late night median 172 with range 126-249; before supper 115 last night  Hypoglycemia frequency:  none         Meals: 3 meals per day: Breakfast at Noon; lunch 5 pm; light supper 1 am          Physical activity: raking leaves, treadmill occasionally           Dietician visit: Most recent: At diagnosis and with nurse educator periodically     Weight control: Wt Readings from Last 3 Encounters:  10/06/13 230 lb 14.4 oz (104.736 kg)  10/03/13 231 lb 6.4 oz  (104.962 kg)  10/11/12 228 lb (103.42 kg)          Complications: none     Diabetes labs:  Lab Results  Component Value Date   HGBA1C 7.9* 10/01/2013   HGBA1C 8.7* 06/15/2011   HGBA1C 8.3* 02/15/2011   Lab Results  Component Value Date   MICROALBUR 0.3 10/01/2013   LDLCALC 9 03/16/2010   CREATININE 0.7 10/01/2013      Medication List       This list is accurate as of: 10/06/13  1:52 PM.  Always use your most recent med list.               ASPIR-LOW 81 MG EC tablet  Generic drug:  aspirin  TAKE 1 TABLET BY MOUTH ONCE DAILY     CITRACAL CALCIUM+D 600-40-500 MG-MG-UNIT Tb24  Generic drug:  Calcium-Magnesium-Vitamin D  Take by mouth daily.     clonazePAM 1 MG tablet  Commonly known as:  KLONOPIN  TAKE 1 TABLET BY MOUTH TWICE A DAY AS NEEDED FOR ANXIETY     docusate sodium 100 MG capsule  Commonly known as:  COLACE  Take 100 mg by mouth daily.     fenofibrate micronized 134 MG capsule  Commonly known as:  LOFIBRA  Take 1 by mouth daily  glimepiride 4 MG tablet  Commonly known as:  AMARYL  Take 4 mg by mouth daily with breakfast.     glucose blood test strip  Commonly known as:  ONE TOUCH ULTRA TEST  Use as instructed     Insulin Pen Needle 32G X 6 MM Misc  Commonly known as:  NOVOFINE  Use three time a day     LEVEMIR FLEXPEN 100 UNIT/ML Sopn  Generic drug:  Insulin Detemir  INJECT UP TO 33 UNITS AT BEDTIME DAILY SUBCUTANEOUSLY     metFORMIN 1000 MG tablet  Commonly known as:  GLUCOPHAGE  TAKE 1 TABLET BY MOUTH TWICE DAILY AS DIRECTED     ONETOUCH DELICA LANCETS Misc  Use up to three times a day     sertraline 100 MG tablet  Commonly known as:  ZOLOFT  TAKE 1 TABLET BY MOUTH DAILY     simvastatin 20 MG tablet  Commonly known as:  ZOCOR  TAKE 1 TABLET BY MOUTH ONCE DAILY AT BEDTIME.     triamterene-hydrochlorothiazide 37.5-25 MG per capsule  Commonly known as:  DYAZIDE  TAKE 1 CAPSULE BY MOUTH ONCE EVERY MORNING     VICTOZA 18 MG/3ML  Sopn  Generic drug:  Liraglutide  Inject 1.2 Units into the skin daily.        Allergies:  Allergies  Allergen Reactions  . Codeine     Past Medical History  Diagnosis Date  . OSTEOPENIA   . TOBACCO USE, QUIT   . HYPERTENSION   . HYPERLIPIDEMIA   . DEPRESSION   . ANXIETY   . ALLERGIC RHINITIS   . Diabetes mellitus   . Internal hemorrhoids without mention of complication 09/24/2001    Colonoscopy--Dr. Russella Dar , pts. states hemorrhoids are uncomfortable    Past Surgical History  Procedure Laterality Date  . Unilateral salpingo-oophorectomy    . Mohs surgery      Precancerous skin lesion  . Removal of fallopion tube & ovary  1986  . Removal of cyst & other ovary  09/2004  . Anus surgery      Family History  Problem Relation Age of Onset  . Hypertension Mother   . Hyperlipidemia Mother   . Nephrolithiasis Mother   . Hypertension Father   . Hyperlipidemia Father   . Nephrolithiasis Father   . Diabetes Maternal Uncle   . Diabetes Mother     Social History:  reports that she quit smoking about 3 years ago. She does not have any smokeless tobacco history on file. She reports that she drinks alcohol. She reports that she does not use illicit drugs.  Review of Systems:  Hypertension:    Lipids:     LABS:  Appointment on 10/01/2013  Component Date Value Range Status  . Hemoglobin A1C 10/01/2013 7.9* 4.6 - 6.5 % Final   Glycemic Control Guidelines for People with Diabetes:Non Diabetic:  <6%Goal of Therapy: <7%Additional Action Suggested:  >8%   . Sodium 10/01/2013 135  135 - 145 mEq/L Final  . Potassium 10/01/2013 3.8  3.5 - 5.1 mEq/L Final  . Chloride 10/01/2013 100  96 - 112 mEq/L Final  . CO2 10/01/2013 25  19 - 32 mEq/L Final  . Glucose, Bld 10/01/2013 243* 70 - 99 mg/dL Final  . BUN 86/57/8469 14  6 - 23 mg/dL Final  . Creatinine, Ser 10/01/2013 0.7  0.4 - 1.2 mg/dL Final  . Total Bilirubin 10/01/2013 0.4  0.3 - 1.2 mg/dL Final  . Alkaline Phosphatase  10/01/2013  74  39 - 117 U/L Final  . AST 10/01/2013 24  0 - 37 U/L Final  . ALT 10/01/2013 32  0 - 35 U/L Final  . Total Protein 10/01/2013 7.8  6.0 - 8.3 g/dL Final  . Albumin 78/46/9629 4.2  3.5 - 5.2 g/dL Final  . Calcium 52/84/1324 9.7  8.4 - 10.5 mg/dL Final  . GFR 40/08/2724 92.64  >60.00 mL/min Final  . Microalb, Ur 10/01/2013 0.3  0.0 - 1.9 mg/dL Final  . Creatinine,U 36/64/4034 34.9   Final  . Microalb Creat Ratio 10/01/2013 0.9  0.0 - 30.0 mg/g Final  . Color, Urine 10/01/2013 LT. YELLOW  Yellow;Lt. Yellow Final  . APPearance 10/01/2013 CLEAR  Clear Final  . Specific Gravity, Urine 10/01/2013 1.020  1.000-1.030 Final  . pH 10/01/2013 7.0  5.0 - 8.0 Final  . Total Protein, Urine 10/01/2013 NEGATIVE  Negative Final  . Urine Glucose 10/01/2013 >=1000  Negative Final  . Ketones, ur 10/01/2013 NEGATIVE  Negative Final  . Bilirubin Urine 10/01/2013 NEGATIVE  Negative Final  . Hgb urine dipstick 10/01/2013 NEGATIVE  Negative Final  . Urobilinogen, UA 10/01/2013 0.2  0.0 - 1.0 Final  . Leukocytes, UA 10/01/2013 NEGATIVE  Negative Final  . Nitrite 10/01/2013 NEGATIVE  Negative Final  . Cholesterol 10/01/2013 124  0 - 200 mg/dL Final   ATP III Classification       Desirable:  < 200 mg/dL               Borderline High:  200 - 239 mg/dL          High:  > = 742 mg/dL  . Triglycerides 10/01/2013 257.0* 0.0 - 149.0 mg/dL Final   Normal:  <595 mg/dLBorderline High:  150 - 199 mg/dL  . HDL 10/01/2013 28.50* >39.00 mg/dL Final  . VLDL 63/87/5643 51.4* 0.0 - 40.0 mg/dL Final  . Total CHOL/HDL Ratio 10/01/2013 4   Final                  Men          Women1/2 Average Risk     3.4          3.3Average Risk          5.0          4.42X Average Risk          9.6          7.13X Average Risk          15.0          11.0                      . Direct LDL 10/01/2013 68.7   Final   Optimal:  <100 mg/dLNear or Above Optimal:  100-129 mg/dLBorderline High:  130-159 mg/dLHigh:  160-189 mg/dLVery High:  >190  mg/dL     Examination:   BP 329/51  Pulse 100  Temp(Src) 98.5 F (36.9 C)  Resp 14  Ht 5\' 4"  (1.626 m)  Wt 230 lb 14.4 oz (104.736 kg)  BMI 39.61 kg/m2  SpO2 98%  Body mass index is 39.61 kg/(m^2).    ASSESSMENT/ PLAN::   Diabetes type 2   Blood glucose control is poor with persistent hyperglycemia most of the time including on waking up Not clear if she has postprandial hyperglycemia but since her evening blood sugars are higher now most of the time she is probably getting progressive insulin deficiency and  needs some mealtime coverage also Because of her high readings fasting even with taking 32 units of Levemir she should benefit from a V-go pump. Demonstrated how this works and she will be instructed in detail by the nurse educator She probably can take small doses of mealtime coverage for her food intake including substantial carbohydrate intake at snacks Also may be able to get better 24-hour coverage compared to Levemir insulin She is quite interested in trying this out and will make an appointment with nurse educator Also we need to have some review of the planning with nurse educator She will continue her Victoza Stressed the importance of trying to check more readings about 2 are after meals including at work  HYPERLIPIDEMIA: She still has a low HDL and high triglycerides which may improve with better glycemic control and weight loss  Counseling time over 50% of today's 25 minute visit  Trayce Caravello 10/06/2013, 1:52 PM

## 2013-10-08 ENCOUNTER — Ambulatory Visit: Payer: 59 | Admitting: Nutrition

## 2013-10-15 ENCOUNTER — Other Ambulatory Visit: Payer: Self-pay | Admitting: Internal Medicine

## 2013-10-17 ENCOUNTER — Other Ambulatory Visit: Payer: Self-pay | Admitting: Internal Medicine

## 2013-10-20 ENCOUNTER — Other Ambulatory Visit: Payer: Self-pay | Admitting: Internal Medicine

## 2013-10-21 ENCOUNTER — Other Ambulatory Visit: Payer: Self-pay | Admitting: Internal Medicine

## 2013-10-21 ENCOUNTER — Ambulatory Visit: Payer: 59 | Admitting: Nutrition

## 2013-10-21 ENCOUNTER — Other Ambulatory Visit: Payer: Self-pay | Admitting: *Deleted

## 2013-10-21 MED ORDER — SIMVASTATIN 20 MG PO TABS: ORAL_TABLET | ORAL | Status: DC

## 2013-10-21 NOTE — Telephone Encounter (Signed)
Received fax stating pt would like 90 day on her simvastatin...lmb

## 2013-10-22 ENCOUNTER — Encounter: Payer: 59 | Attending: Endocrinology | Admitting: Nutrition

## 2013-10-22 DIAGNOSIS — Z713 Dietary counseling and surveillance: Secondary | ICD-10-CM | POA: Insufficient documentation

## 2013-10-22 DIAGNOSIS — IMO0001 Reserved for inherently not codable concepts without codable children: Secondary | ICD-10-CM | POA: Insufficient documentation

## 2013-10-22 NOTE — Progress Notes (Signed)
Pt. Took her Levemir insulin last night at 9 PM.  Blood sugar was 267 now, 3 hours after breakfast.    Pt. Was instructed on the use of the V-Go 30.  She filled, and applied it after being shown how to use it.  She re demonstrated how to give the bolus amounts, and will give 1 button press (2 units) for every 2 servings of carbohydrates.  She was given a list of carb servings, and reported good understanding of how/when/and how much to give before each meal.  She was given a starter kit of V-Go 30, and a bottle of Humalog insulin.  She will call customer care for insurance coverage questions and if she has questions with using this device.  She had no final questions for me. She was told to test  Blood sugars before meals and at bedtime.    She will call Monday with blood sugar readings.  She was given a log book to record the blood sugar readings.

## 2013-10-22 NOTE — Patient Instructions (Addendum)
1. Fill and apply a new V-go every day 2. Take one button press (2u of insulin) for every 30 grams of carbohydrates (2 servings) 3. Test blood sugars before meals and at bedtime 4.  Call blood sugars on Monday  [phone]  Ext. [phone] 5.  Call before then, if blood sugars drop low

## 2013-10-23 ENCOUNTER — Telehealth: Payer: Self-pay | Admitting: *Deleted

## 2013-10-23 NOTE — Telephone Encounter (Signed)
It will take another one to 2 days to improve, she can call tomorrow morning if blood sugar not better. She can take extra bolus with 2-3 clicks on the pump for high readings over 200

## 2013-10-23 NOTE — Telephone Encounter (Signed)
Pt is aware.  

## 2013-10-23 NOTE — Telephone Encounter (Signed)
Pt is concerned that she is not getting enough insulin through the V-Go 30, she said this morning her fasting bs was 257. She said she has been running between 250-300.  Wants to know what to do. CB # [id number]

## 2013-10-29 ENCOUNTER — Telehealth: Payer: Self-pay | Admitting: Endocrinology

## 2013-10-29 ENCOUNTER — Other Ambulatory Visit: Payer: Self-pay | Admitting: *Deleted

## 2013-10-29 MED ORDER — V-GO 30 KIT: PACK | Status: DC

## 2013-10-29 MED ORDER — INSULIN LISPRO 100 UNIT/ML CARTRIDGE: SUBCUTANEOUS | Status: DC

## 2013-10-29 NOTE — Telephone Encounter (Signed)
Blood sugar readings after starting V-go 30: 12/10:  163 (11AM) 12/09:  133 (5AM),   133 (10:30 AM fasting),  117 (2hr.pcL),   129 (HS) 12/08:   160 (11:30AM fasting),  143 (acL),      269 (2hr. pc pizza) 12/07:  153 (11AM),   (141 acL, 3:45 PM),  255 (acS)          176 (HS) Will need to order today.  She is on her last one.

## 2013-10-29 NOTE — Telephone Encounter (Signed)
rx sent to pharmacy

## 2013-10-29 NOTE — Telephone Encounter (Signed)
Please order V. -go  30 Also needs to see me in the office next week, may need labs, please check

## 2013-11-06 ENCOUNTER — Ambulatory Visit (INDEPENDENT_AMBULATORY_CARE_PROVIDER_SITE_OTHER): Payer: 59 | Admitting: Endocrinology

## 2013-11-06 ENCOUNTER — Encounter: Payer: Self-pay | Admitting: Endocrinology

## 2013-11-06 VITALS — BP 124/70 | HR 100 | Temp 98.2°F | Resp 12 | Ht 64.0 in | Wt 233.2 lb

## 2013-11-06 DIAGNOSIS — IMO0001 Reserved for inherently not codable concepts without codable children: Secondary | ICD-10-CM

## 2013-11-06 DIAGNOSIS — E785 Hyperlipidemia, unspecified: Secondary | ICD-10-CM

## 2013-11-06 NOTE — Patient Instructions (Addendum)
Take extra 1-2 clicks at supper and more if high fat  Victoza 5 clicks more Metformin at supper, if am sugar add at bedtime too

## 2013-11-06 NOTE — Progress Notes (Signed)
Patient ID: Destiny Tucker, female   DOB: 18-May-1956, 57 y.o.   MRN: [id number]  Destiny Tucker is an 57 y.o. female.   Reason for Appointment: Diabetes follow-up   History of Present Illness   Diagnosis: Type 2 DIABETES MELITUS, date of diagnosis:  08/2010     Previous history: She has been on various regimens for her diabetes including Byetta in the past. Because of progressively higher fasting readings she was started on Levemir insulin in 2013 Although her A1c has been usually about 7.3% she tends to have relatively high fasting readings Has been somewhat irregular with her followup over the last year  Recent history: Her blood sugars were overall worse  on her last visit with increasing A1c Because of difficulty with control and also difficulty getting morning sugars adequately controlled she was switched to the V.-go pump She was started on basal rate of 30 given to her Levemir dose was over 30 units However although her blood sugars were initially higher she is now getting reasonably good fasting readings Blood sugars are still tending to be high after supper and late at night and not clear what the reason is. BOLUSES are being done with her meals and she is only using about 4-6 units before meals currently Metformin: She is only taking this at bedtime instead of both supper and bedtime Taking Victoza at 4 PM daily     Oral hypoglycemic drugs: Amaryl hs, Metformin 1g hs      Side effects from medications:  occasional nausea,? From Victoza Insulin regimen: V-go 30; Boluses at Bfst 2-3 clicks; at dinner 2-3 clicks            Monitors blood glucose:  1-2 times a day.    Glucometer: One Touch.          Blood Glucose readings from meter download:  PREMEAL Breakfast Lunch Dinner Bedtime Overall  Glucose range:  110-191     85-200    Mean/median:  160     166   159    POST-MEAL PC Breakfast PC Lunch PC Dinner  Glucose range:    147-285   Mean/median:    187    Hypoglycemia:  none          Meals: 3 meals per day: Breakfast at [employer]; lunch 5 pm; snacks at bedtime           Physical activity: raking leaves, treadmill occasionally           Dietician visit: Most recent: At diagnosis and with nurse educator periodically     Weight control: Wt Readings from Last 3 Encounters:  11/06/13 233 lb 3.2 oz (105.779 kg)  10/06/13 230 lb 14.4 oz (104.736 kg)  10/03/13 231 lb 6.4 oz (104.962 kg)        Complications: none     Diabetes labs:  Lab Results  Component Value Date   HGBA1C 7.9* 10/01/2013   HGBA1C 8.7* 06/15/2011   HGBA1C 8.3* 02/15/2011   Lab Results  Component Value Date   MICROALBUR 0.3 10/01/2013   LDLCALC 9 03/16/2010   CREATININE 0.7 10/01/2013      Medication List       This list is accurate as of: 11/06/13  1:58 PM.  Always use your most recent med list.               ASPIR-LOW 81 MG EC tablet  Generic drug:  aspirin  TAKE 1 TABLET BY MOUTH ONCE DAILY  CITRACAL CALCIUM+D 600-40-500 MG-MG-UNIT Tb24  Generic drug:  Calcium-Magnesium-Vitamin D  Take by mouth daily.     clonazePAM 1 MG tablet  Commonly known as:  KLONOPIN  TAKE 1 TABLET BY MOUTH TWICE A DAY AS NEEDED FOR ANXIETY     docusate sodium 100 MG capsule  Commonly known as:  COLACE  Take 100 mg by mouth daily.     fenofibrate micronized 134 MG capsule  Commonly known as:  LOFIBRA  Take 1 by mouth daily     glimepiride 4 MG tablet  Commonly known as:  AMARYL  Take 4 mg by mouth daily with breakfast.     glucose blood test strip  Use to test up to 3 times daily     insulin lispro 100 UNIT/ML Soct  Commonly known as:  HUMALOG  72 units per day     metFORMIN 1000 MG tablet  Commonly known as:  GLUCOPHAGE  TAKE 1 TABLET BY MOUTH once a  DAy  AS DIRECTED     NOVOFINE 32G X 6 MM Misc  Generic drug:  Insulin Pen Needle  USE 3 TIMES A DAY     ONETOUCH DELICA LANCETS Misc  Use up to three times a day     sertraline 100 MG tablet  Commonly known as:  ZOLOFT  TAKE 1  TABLET BY MOUTH DAILY     simvastatin 20 MG tablet  Commonly known as:  ZOCOR  TAKE 1 TABLET BY MOUTH EVERY NIGHT AT BEDTIME     triamterene-hydrochlorothiazide 37.5-25 MG per capsule  Commonly known as:  DYAZIDE  TAKE 1 CAPSULE BY MOUTH ONCE EVERY MORNING     V-GO 30 Kit  Do 1 button press for every 30 grams of carbs     VICTOZA 18 MG/3ML Sopn  Generic drug:  Liraglutide  Inject 1.2 Units into the skin daily.        Allergies:  Allergies  Allergen Reactions  . Codeine     Past Medical History  Diagnosis Date  . OSTEOPENIA   . TOBACCO USE, QUIT   . HYPERTENSION   . HYPERLIPIDEMIA   . DEPRESSION   . ANXIETY   . ALLERGIC RHINITIS   . Diabetes mellitus   . Internal hemorrhoids without mention of complication 09/24/2001    Colonoscopy--Dr. Russella Dar , pts. states hemorrhoids are uncomfortable    Past Surgical History  Procedure Laterality Date  . Unilateral salpingo-oophorectomy    . Mohs surgery      Precancerous skin lesion  . Removal of fallopion tube & ovary  1986  . Removal of cyst & other ovary  09/2004  . Anus surgery      Family History  Problem Relation Age of Onset  . Hypertension Mother   . Hyperlipidemia Mother   . Nephrolithiasis Mother   . Hypertension Father   . Hyperlipidemia Father   . Nephrolithiasis Father   . Diabetes Maternal Uncle   . Diabetes Mother     Social History:  reports that she quit smoking about 3 years ago. She does not have any smokeless tobacco history on file. She reports that she drinks alcohol. She reports that she does not use illicit drugs.  Review of Systems:  Hypertension:  mild and controlled with only Dyazide  Lipids: She is only on 20 mg of simvastatin with good control. Triglycerides over 200 on the last visit, currently taking fenofibrate    LABS:  No visits with results within 1 Week(s) from this visit.  Latest known visit with results is:  Appointment on 10/01/2013  Component Date Value Range Status   . Hemoglobin A1C 10/01/2013 7.9* 4.6 - 6.5 % Final   Glycemic Control Guidelines for People with Diabetes:Non Diabetic:  <6%Goal of Therapy: <7%Additional Action Suggested:  >8%   . Sodium 10/01/2013 135  135 - 145 mEq/L Final  . Potassium 10/01/2013 3.8  3.5 - 5.1 mEq/L Final  . Chloride 10/01/2013 100  96 - 112 mEq/L Final  . CO2 10/01/2013 25  19 - 32 mEq/L Final  . Glucose, Bld 10/01/2013 243* 70 - 99 mg/dL Final  . BUN 16/08/9603 14  6 - 23 mg/dL Final  . Creatinine, Ser 10/01/2013 0.7  0.4 - 1.2 mg/dL Final  . Total Bilirubin 10/01/2013 0.4  0.3 - 1.2 mg/dL Final  . Alkaline Phosphatase 10/01/2013 74  39 - 117 U/L Final  . AST 10/01/2013 24  0 - 37 U/L Final  . ALT 10/01/2013 32  0 - 35 U/L Final  . Total Protein 10/01/2013 7.8  6.0 - 8.3 g/dL Final  . Albumin 54/07/8118 4.2  3.5 - 5.2 g/dL Final  . Calcium 14/78/2956 9.7  8.4 - 10.5 mg/dL Final  . GFR 21/30/8657 92.64  >60.00 mL/min Final  . Microalb, Ur 10/01/2013 0.3  0.0 - 1.9 mg/dL Final  . Creatinine,U 84/69/6295 34.9   Final  . Microalb Creat Ratio 10/01/2013 0.9  0.0 - 30.0 mg/g Final  . Color, Urine 10/01/2013 LT. YELLOW  Yellow;Lt. Yellow Final  . APPearance 10/01/2013 CLEAR  Clear Final  . Specific Gravity, Urine 10/01/2013 1.020  1.000-1.030 Final  . pH 10/01/2013 7.0  5.0 - 8.0 Final  . Total Protein, Urine 10/01/2013 NEGATIVE  Negative Final  . Urine Glucose 10/01/2013 >=1000  Negative Final  . Ketones, ur 10/01/2013 NEGATIVE  Negative Final  . Bilirubin Urine 10/01/2013 NEGATIVE  Negative Final  . Hgb urine dipstick 10/01/2013 NEGATIVE  Negative Final  . Urobilinogen, UA 10/01/2013 0.2  0.0 - 1.0 Final  . Leukocytes, UA 10/01/2013 NEGATIVE  Negative Final  . Nitrite 10/01/2013 NEGATIVE  Negative Final  . Cholesterol 10/01/2013 124  0 - 200 mg/dL Final   ATP III Classification       Desirable:  < 200 mg/dL               Borderline High:  200 - 239 mg/dL          High:  > = 284 mg/dL  . Triglycerides 10/01/2013  257.0* 0.0 - 149.0 mg/dL Final   Normal:  <132 mg/dLBorderline High:  150 - 199 mg/dL  . HDL 10/01/2013 28.50* >39.00 mg/dL Final  . VLDL 44/11/270 51.4* 0.0 - 40.0 mg/dL Final  . Total CHOL/HDL Ratio 10/01/2013 4   Final                  Men          Women1/2 Average Risk     3.4          3.3Average Risk          5.0          4.42X Average Risk          9.6          7.13X Average Risk          15.0          11.0                      .  Direct LDL 10/01/2013 68.7   Final   Optimal:  <100 mg/dLNear or Above Optimal:  100-129 mg/dLBorderline High:  130-159 mg/dLHigh:  160-189 mg/dLVery High:  >190 mg/dL     Examination:   BP 130/86  Pulse 100  Temp(Src) 98.2 F (36.8 C)  Resp 12  Ht 5\' 4"  (1.626 m)  Wt 233 lb 3.2 oz (105.779 kg)  BMI 40.01 kg/m2  SpO2 95%  Body mass index is 40.01 kg/(m^2).   She has some residual marks of the adhesive tape on her abdomen  ASSESSMENT/ PLAN::   Diabetes type 2   Blood glucose control is overall improving with starting the V- go pump compared to Levemir Although her labs are not assessed her fasting readings are significantly better the last few days and also bedtime readings are not consistently high She does appear to be requiring more insulin to cover her evening meal although difficult to assess since he does not do a two-hour reading consistently Discussed day-to-day management of her diabetes including covering various types of meals and snacks  She was told to increase her coverage for her evening meal by 2-4 units and also consistently take a bolus for snacks in the evening She should be able to increase her bolus significantly if needed However to help with postprandial hyperglycemia will try to increase her Victoza by at least 0.3 mg beyond the 1.2 mg dosage She will take metformin both at suppertime and bedtime instead of only at bedtime  HYPERLIPIDEMIA: She still has a low HDL and high triglycerides which may improve with better glycemic  control and weight loss  Counseling time over 50% of today's 25 minute visit  Daylen Hack 11/06/2013, 1:58 PM

## 2013-11-24 ENCOUNTER — Telehealth: Payer: Self-pay | Admitting: *Deleted

## 2013-11-24 NOTE — Telephone Encounter (Signed)
Is this about the device to fill the insulin pump? Please check with Vaughan Basta Otherwise she will need to use Levemir insulin twice a day

## 2013-11-24 NOTE — Telephone Encounter (Signed)
Pt had a problem with her V-Go and the company is sending her a new cylinder, she wants to know if she can take her insulin another way until she gets the new part in?

## 2013-11-24 NOTE — Telephone Encounter (Signed)
Noted, pt is aware 

## 2013-12-01 ENCOUNTER — Other Ambulatory Visit: Payer: Self-pay | Admitting: *Deleted

## 2013-12-01 ENCOUNTER — Telehealth: Payer: Self-pay | Admitting: *Deleted

## 2013-12-01 ENCOUNTER — Other Ambulatory Visit: Payer: Self-pay | Admitting: Endocrinology

## 2013-12-01 MED ORDER — INSULIN DETEMIR 100 UNIT/ML FLEXPEN: 16.0000 [IU] | PEN_INJECTOR | Freq: Two times a day (BID) | SUBCUTANEOUS | Status: DC

## 2013-12-01 NOTE — Telephone Encounter (Signed)
She will need to take NovoLog with meals, and the same dosage and as she was doing with the pump along with her Levemir. Okay to send Levemir prescription. Will need followup to review her blood sugars within the next month: Does not have any pending appointments. Also needs to be getting lab work before her next visit

## 2013-12-01 NOTE — Telephone Encounter (Signed)
Message left on patients voicemail, rx called in for levemir

## 2013-12-01 NOTE — Telephone Encounter (Signed)
Pt says she no longer wants to use the V-Go, she said it's cumbersome and she does not feel like it's helping to keep her sugars down.   She would like to go back to using the levemir. If that's okay she needs a new rx sent in to her pharmacy.

## 2013-12-03 ENCOUNTER — Telehealth: Payer: Self-pay | Admitting: *Deleted

## 2013-12-03 NOTE — Telephone Encounter (Signed)
Patient called, she is unclear about the instructions for her Novolog insulin, on the last note you said She will need to take NovoLog with meals, and the same dosage and as she was doing with the pump along with her Levemir.  Patient and Pharmacist apparently does not understand and thinks that is a lot of insulin, can you please clarify instructions so I can give them to patient please.

## 2013-12-03 NOTE — Telephone Encounter (Signed)
This is the same as the 2-3 clicks on the pump she was using, this is equivalent to 4-6 units of NovoLog with her main meals

## 2013-12-10 ENCOUNTER — Telehealth: Payer: Self-pay | Admitting: Endocrinology

## 2013-12-10 NOTE — Telephone Encounter (Signed)
Please have her schedule appointment for followup with me, currently has none

## 2013-12-10 NOTE — Telephone Encounter (Signed)
Patient reports FBSs are around 150s.  Today it was 182.  Not testing much during the day.  Says they are usually 120s or 130 acL or supper.  But says they go high at HS.   Taking Levemir: 17u BID, Novolog 4-5u ac meals--usually taking 5u.   Plan: increase PM dose of Levemir to 18u.[name]  And increase acS Novolog to 6u.

## 2013-12-10 NOTE — Telephone Encounter (Signed)
Note on my desk saying patient's blood sugars are high, please call [phone].  Message left on her  machine at 12PM, and again at [facility] to call me.

## 2013-12-11 ENCOUNTER — Telehealth: Payer: Self-pay | Admitting: Endocrinology

## 2013-12-11 NOTE — Telephone Encounter (Signed)
Attempted to contact pt regarding getting a follow up scheduled per Dr. Dwyane Dee

## 2013-12-22 ENCOUNTER — Other Ambulatory Visit: Payer: 59

## 2013-12-22 DIAGNOSIS — IMO0001 Reserved for inherently not codable concepts without codable children: Secondary | ICD-10-CM

## 2013-12-22 DIAGNOSIS — E1165 Type 2 diabetes mellitus with hyperglycemia: Principal | ICD-10-CM

## 2013-12-23 ENCOUNTER — Ambulatory Visit: Payer: 59 | Admitting: Nutrition

## 2013-12-24 ENCOUNTER — Encounter: Payer: 59 | Attending: Endocrinology | Admitting: Nutrition

## 2013-12-24 DIAGNOSIS — IMO0001 Reserved for inherently not codable concepts without codable children: Secondary | ICD-10-CM | POA: Insufficient documentation

## 2013-12-24 DIAGNOSIS — E1165 Type 2 diabetes mellitus with hyperglycemia: Principal | ICD-10-CM

## 2013-12-24 DIAGNOSIS — Z713 Dietary counseling and surveillance: Secondary | ICD-10-CM | POA: Insufficient documentation

## 2013-12-25 ENCOUNTER — Other Ambulatory Visit: Payer: Self-pay | Admitting: Endocrinology

## 2013-12-25 ENCOUNTER — Ambulatory Visit: Payer: 59 | Admitting: Endocrinology

## 2013-12-25 NOTE — Progress Notes (Signed)
Pt. Is here today because she is upset that her blood sugars have been running high, and Dr. Dwyane Dee was not able to see her this week.   Review of diet shows:   Pt. Is waking up at 11AM, she eats breakfast at 12:30 and takes 6u of Humalog.  She will eat "a little something, but not much"  Like toast with jelly, or an egg and toast, or bagel.   First main meal is when she goes to work at [employer]:   At [place] she is havinging a "small snack" like: pretzels, cookie, or something in a small bag, like chips, or crackers.  At 5PM she eats her supper and takes 6u Humalog:  Meat, 1-2 veg. (1 starcy veg) and at 9-10 PM she is again eating a small snack like a bag of popcorn.  At 2AM she is eating again, and will take 6 more units of Humalog.  She goes to bed shortly thereafter.  Medication: Levermir: 18u bid (12PM and 12AM), and Humalog 6u ac all meals.   Also said that she is "considering not taking her Victoza".  She denied having stopped this.  I encouraged her to continue this until she sees Dr. Dwyane Dee in 2 weeks.  Discussed how this works and the fact that it may decrease her appetite.  She agreed to continue this.  She reports take between the 1.2 mark, and the 1.8 mark. SBGM:  She is testing mostly before meals:  FBSs for last 2 weeks is 141-210, acL (4PM):145-198, acS:134-222.    Plan:   1.  Stop eating so many snacks.  Eat 3 meals: 12PM, 5PM, 1AM.  2.  Can have one snack at 10PM, but it should be less than 150 calories 3. Switch to [employer] individual size popcorns 4.  Increase levemir to 19 u bid 5.  Increase Humalog to 7u ac meals and do a correction of 1u for every 50 points over 150.  Sliding scale given to her for this.

## 2013-12-25 NOTE — Patient Instructions (Signed)
Test blood sugars before meals and at bedtime. Increase Levemir to 19u twice daily. Increase Humalog to 7u before meals and a correction dose of 1u for every 50 points over 150.   Continue taking the Victoza as directed by Dr. Dwyane Dee Begin a walking program.  If walking after breakfast, decrease the Humalog dose at breakfast by 1-2 units.

## 2013-12-29 ENCOUNTER — Other Ambulatory Visit: Payer: Self-pay | Admitting: Internal Medicine

## 2014-01-12 ENCOUNTER — Other Ambulatory Visit: Payer: Self-pay | Admitting: *Deleted

## 2014-01-12 ENCOUNTER — Other Ambulatory Visit (INDEPENDENT_AMBULATORY_CARE_PROVIDER_SITE_OTHER): Payer: 59

## 2014-01-12 DIAGNOSIS — IMO0001 Reserved for inherently not codable concepts without codable children: Secondary | ICD-10-CM

## 2014-01-12 DIAGNOSIS — E1165 Type 2 diabetes mellitus with hyperglycemia: Principal | ICD-10-CM

## 2014-01-12 LAB — BASIC METABOLIC PANEL
BUN: 15 mg/dL (ref 6–23)
CHLORIDE: 99 meq/L (ref 96–112)
CO2: 26 mEq/L (ref 19–32)
Calcium: 9.7 mg/dL (ref 8.4–10.5)
Creatinine, Ser: 0.6 mg/dL (ref 0.4–1.2)
GFR: 113.09 mL/min (ref >60.00)
Glucose, Bld: 191 mg/dL — ABNORMAL HIGH (ref 70–99)
Potassium: 3.5 mEq/L (ref 3.5–5.1)
SODIUM: 133 meq/L — AB (ref 135–145)

## 2014-01-12 LAB — HEMOGLOBIN A1C: Hgb A1c MFr Bld: 8 % — ABNORMAL HIGH (ref 4.6–6.5)

## 2014-01-13 ENCOUNTER — Other Ambulatory Visit: Payer: 59

## 2014-01-15 ENCOUNTER — Ambulatory Visit: Payer: 59 | Admitting: Endocrinology

## 2014-01-16 ENCOUNTER — Ambulatory Visit: Payer: 59 | Admitting: Endocrinology

## 2014-01-16 ENCOUNTER — Encounter: Payer: Self-pay | Admitting: Endocrinology

## 2014-01-16 ENCOUNTER — Ambulatory Visit (INDEPENDENT_AMBULATORY_CARE_PROVIDER_SITE_OTHER): Payer: 59 | Admitting: Endocrinology

## 2014-01-16 ENCOUNTER — Other Ambulatory Visit: Payer: Self-pay | Admitting: *Deleted

## 2014-01-16 VITALS — BP 118/84 | HR 103 | Temp 97.9°F | Resp 16 | Ht 64.0 in | Wt 234.8 lb

## 2014-01-16 DIAGNOSIS — E669 Obesity, unspecified: Secondary | ICD-10-CM

## 2014-01-16 DIAGNOSIS — IMO0001 Reserved for inherently not codable concepts without codable children: Secondary | ICD-10-CM

## 2014-01-16 DIAGNOSIS — E785 Hyperlipidemia, unspecified: Secondary | ICD-10-CM

## 2014-01-16 DIAGNOSIS — E1165 Type 2 diabetes mellitus with hyperglycemia: Principal | ICD-10-CM

## 2014-01-16 MED ORDER — V-GO 40 KIT: PACK | Status: DC

## 2014-01-16 NOTE — Progress Notes (Signed)
Patient ID: Destiny Tucker, female   DOB: [birth year], 58 y.o.   MRN: [id number]   Reason for Appointment: Diabetes follow-up   History of Present Illness   Diagnosis: Type 2 DIABETES MELITUS, date of diagnosis:  08/2010     Previous history: She has been on various regimens for her diabetes including Byetta in the past. Because of progressively higher fasting readings she was started on Levemir insulin in 2013 She usually tends to have relatively high fasting readings  Recent history: Her blood sugars have been more difficult to control with increasing A1c since at least 11/14 Because of this and also difficulty getting morning sugars adequately controlled she was switched to the V.-go pump She was doing relatively better with the 30 unit pump and not requiring much coverage for meals, usually 4-6 units She had considerable education done by the nurse educator also In 11/2013 she decided to go off the V- go pump for various reasons including a little leakage of insulin at the site and also marks on her skin from the pump adhesive Since then her blood sugars have been getting higher and she has needed increasing doses of Levemir but her blood sugars are still not well controlled with average glucose fasting about 185 Her Victoza dosage is a little beyond the 1.2 mg dosage She has not been working but her mealtimes are about the same; blood sugars are relatively better before supper Checking blood sugars only sporadically after meals and not clear of her postprandial control; she has had readings as high as 315 partly from not watching her diet and occasionally from forgetting her mealtime insulin She is taking her HUMALOG dose arbitrarily based on her meal size but not counting carbohydrates She is not exercising either Metformin: She is only taking this at bedtime instead of both supper and bedtime     Oral hypoglycemic drugs: Amaryl hs, Metformin 1g hs      Side effects from medications:   occasional nausea,? From Victoza Insulin regimen:4-8 units Humalog, Levemir 20 bid           Monitors blood glucose:  1-2 times a day.    Glucometer: One Touch.          Blood Glucose readings from meter download:  PREMEAL Breakfast Lunch Dinner Bedtime Overall  Glucose range:  135-221    89-155   188-315    median:  194     290   188    POST-MEAL PC Breakfast PC Lunch PC Dinner  Glucose range:  135   ?   Mean/median:      Hypoglycemia:  none         Meals: 3 meals per day: Breakfast at [employer]; lunch 7 pm; snacks at night, occasionally doughnuts for snacks           Physical activity:  occasionally           Dietician visit: Most recent: At diagnosis and with nurse educator periodically      Weight control:  Wt Readings from Last 3 Encounters:  01/16/14 234 lb 12.8 oz (106.505 kg)  11/06/13 233 lb 3.2 oz (105.779 kg)  10/06/13 230 lb 14.4 oz (123XX123 kg)        Complications: none     Diabetes labs:  Lab Results  Component Value Date   HGBA1C 8.0* 01/12/2014   HGBA1C 7.9* 10/01/2013   HGBA1C 8.7* 06/15/2011   Lab Results  Component Value Date   MICROALBUR 0.3 10/01/2013  [place] 9 03/16/2010   CREATININE 0.6 01/12/2014      Medication List       This list is accurate as of: 01/16/14  1:55 PM.  Always use your most recent med list.               ASPIR-LOW 81 MG EC tablet  Generic drug:  aspirin  TAKE 1 TABLET BY MOUTH ONCE DAILY     B-D SINGLE USE SWABS REGULAR Pads  USE AS DIRECTED TWICE DAILY     clonazePAM 1 MG tablet  Commonly known as:  KLONOPIN  TAKE 1 TABLET BY MOUTH TWICE A DAY AS NEEDED FOR ANXIETY     docusate sodium 100 MG capsule  Commonly known as:  COLACE  Take 100 mg by mouth daily.     fenofibrate micronized 134 MG capsule  Commonly known as:  LOFIBRA  Take 1 by mouth daily     glimepiride 4 MG tablet  Commonly known as:  AMARYL  Take 4 mg by mouth daily with breakfast.     glucose blood test strip  Use to test up to 3 times daily      Insulin Detemir 100 UNIT/ML Pen  Commonly known as:  LEVEMIR  Inject 20 Units into the skin 2 (two) times daily.     insulin lispro 100 UNIT/ML cartridge  Commonly known as:  HUMALOG  Inject 4-8 Units into the skin 3 (three) times daily with meals.     metFORMIN 1000 MG tablet  Commonly known as:  GLUCOPHAGE  TAKE 1 TABLET BY MOUTH once a  DAy  AS DIRECTED     NOVOFINE 32G X 6 MM Misc  Generic drug:  Insulin Pen Needle  USE 3 TIMES A DAY     ONETOUCH DELICA LANCETS Misc  Use up to three times a day     ONETOUCH DELICA LANCETS 44I Misc  USE TO TEST UP TO 3 TIMES DAILY     sertraline 100 MG tablet  Commonly known as:  ZOLOFT  TAKE 1 TABLET BY MOUTH DAILY     simvastatin 20 MG tablet  Commonly known as:  ZOCOR  TAKE 1 TABLET BY MOUTH EVERY NIGHT AT BEDTIME     triamterene-hydrochlorothiazide 37.5-25 MG per capsule  Commonly known as:  DYAZIDE  TAKE 1 CAPSULE BY MOUTH ONCE EVERY MORNING     VICTOZA 18 MG/3ML Sopn  Generic drug:  Liraglutide  Inject 1.2 Units into the skin daily. Plus 3 clicks        Allergies:  Allergies  Allergen Reactions  . Codeine     Past Medical History  Diagnosis Date  . OSTEOPENIA   . TOBACCO USE, QUIT   . HYPERTENSION   . HYPERLIPIDEMIA   . DEPRESSION   . ANXIETY   . ALLERGIC RHINITIS   . Diabetes mellitus   . Internal hemorrhoids without mention of complication 34/74/2595    Colonoscopy--Dr. Fuller Plan , pts. states hemorrhoids are uncomfortable    Past Surgical History  Procedure Laterality Date  . Unilateral salpingo-oophorectomy    . Mohs surgery      Precancerous skin lesion  . Removal of fallopion tube & ovary  1986  . Removal of cyst & other ovary  09/2004  . Anus surgery      Family History  Problem Relation Age of Onset  . Hypertension Mother   . Hyperlipidemia Mother   . Nephrolithiasis Mother   . Hypertension Father   . Hyperlipidemia Father   .  Nephrolithiasis Father   . Diabetes Maternal Uncle   .  Diabetes Mother     Social History:  reports that she quit smoking about 3 years ago. She does not have any smokeless tobacco history on file. She reports that she drinks alcohol. She reports that she does not use illicit drugs.  Review of Systems:  Hypertension:  mild and controlled with only Dyazide  Lipids: She is only on 20 mg of simvastatin with good control. Triglycerides over 200 on the last visit, currently taking fenofibrate    LABS:  Appointment on 01/12/2014  Component Date Value Ref Range Status  . Hemoglobin A1C 01/12/2014 8.0* 4.6 - 6.5 % Final   Glycemic Control Guidelines for People with Diabetes:Non Diabetic:  <6%Goal of Therapy: <7%Additional Action Suggested:  >8%   . Sodium 01/12/2014 133* 135 - 145 mEq/L Final  . Potassium 01/12/2014 3.5  3.5 - 5.1 mEq/L Final  . Chloride 01/12/2014 99  96 - 112 mEq/L Final  . CO2 01/12/2014 26  19 - 32 mEq/L Final  . Glucose, Bld 01/12/2014 191* 70 - 99 mg/dL Final  . BUN 01/12/2014 15  6 - 23 mg/dL Final  . Creatinine, Ser 01/12/2014 0.6  0.4 - 1.2 mg/dL Final  . Calcium 01/12/2014 9.7  8.4 - 10.5 mg/dL Final  . GFR 01/12/2014 113.09  >60.00 mL/min Final     Examination:   BP 118/84  Pulse 103  Temp(Src) 97.9 F (36.6 C)  Resp 16  Ht 5\' 4"  (1.626 m)  Wt 234 lb 12.8 oz (106.505 kg)  BMI 40.28 kg/m2  SpO2 96%  Body mass index is 40.28 kg/(m^2).   No ankle edema  ASSESSMENT/ PLAN::   Diabetes type 2   Blood glucose control was improving with using the V- go pump however she chose to go back to be basal bolus injections but her blood sugars are not doing too well even though she has been increasing her insulin, Victoza and getting less snacks in the afternoon A1c is still around 8% See history of present illness for details of her problems identified and current blood sugar patterns Since she appears to have relatively high readings on waking up she will increase her evening Levemir by 6 units while sitting down the  morning dose by at least 2 units Also she will consider doing the V-go pump again to avoid multiple injections and hopefully get better control. She will look into the cost of this also since she may not have insurance coverage next week Most likely will need to take more insulin before supper but discussed needing to check readings consistently 2 hours after eating to help adjust her dosage. Discussed how to label her readings on the One Touch ultra meter and discussed blood sugar targets Encouraged her to start regular exercise program She will continue Victoza unchanged  HYPERLIPIDEMIA: She  has a low HDL and high triglycerides which may improve with better glycemic control and weight loss  Counseling time over 50% of today's 25 minute visit  Javius Sylla 01/16/2014, 1:55 PM

## 2014-01-16 NOTE — Patient Instructions (Addendum)
Must check sugars 2 hrs after meals 4-8 units Humalog at meals and 2 hours after meal <180  Levemir 18 am and 26 at night    Exercise daily

## 2014-01-26 ENCOUNTER — Telehealth: Payer: Self-pay | Admitting: *Deleted

## 2014-01-26 NOTE — Telephone Encounter (Signed)
Left msg on vm stating she has lost her job, have no insurance or income. The fenofibrate that md rx she no longer can afford. Without insurance med is over $200 a month. Wanting to see can md change to something else...Johny Chess

## 2014-01-27 NOTE — Telephone Encounter (Signed)
I suggest pt check goodrx.com (or download app "good rx" if pt has smart phone) She can find a lesser price for the fenofibrate micronized at different local pharmacies (like $28.24 at [facility] for this rx with the app "coupon") Pt can let us know which pharmacy to send rx to or can pick up rx to take to her choice pharmacy There is no other medication "substitute" for this drug unfortunately Thanks!

## 2014-01-27 NOTE — Telephone Encounter (Signed)
Called pt no answer LMOM with md response.../lmb 

## 2014-02-03 ENCOUNTER — Telehealth: Payer: Self-pay | Admitting: Endocrinology

## 2014-02-03 NOTE — Telephone Encounter (Signed)
?   Insulin doses

## 2014-02-03 NOTE — Telephone Encounter (Signed)
Please see list of readings below and advise

## 2014-02-03 NOTE — Telephone Encounter (Signed)
Pt returning call regarding readings 3/13 219 3/14 182 3/15 193 3/16 197 3/17 206 All before 12 noon. Please advise thanks

## 2014-02-04 NOTE — Telephone Encounter (Signed)
Message left on her machine to increase her PM dose of Levemir to 30u. And to do some after supper blood sugar readings

## 2014-02-04 NOTE — Telephone Encounter (Signed)
Increase pm Levemir to 30, check sugars after supper

## 2014-02-04 NOTE — Telephone Encounter (Signed)
Patient is taking Levermir: 18AM, 26PM Humalog: 4-6u acB,  8u acS,  And 4-6u acHS Snack

## 2014-02-26 ENCOUNTER — Other Ambulatory Visit: Payer: Self-pay | Admitting: *Deleted

## 2014-02-26 MED ORDER — "INSULIN SYRINGE-NEEDLE U-100 31G X 5/16"" 0.3 ML MISC": Status: DC

## 2014-03-02 ENCOUNTER — Telehealth: Payer: Self-pay | Admitting: Endocrinology

## 2014-03-02 ENCOUNTER — Telehealth: Payer: Self-pay | Admitting: *Deleted

## 2014-03-02 NOTE — Telephone Encounter (Signed)
Message left on my machine that she picked up her insulin, but it had not syringes, and it was not the pens that she is used to.  She called the company, and they asked if she had arthritis in her hands and she said yes.  They told her to get the doctor to send a note to them, and they would send pens with the next shipment.  I left a message on her machine that there is no documentation in the chart of her arthritis, and that she will need to call her primary care physician for this.

## 2014-03-02 NOTE — Telephone Encounter (Signed)
Patient wants to know if she still needs to take her Levemir.  She said she's taking the novolog and her sugars seems to be fine without it.  She also states that she spoke with someone at the patient assistant who said if you wrote a letter stating she had arthritis they would give her the flexpen.  I told her she needed to get that from Dr. Basilio Cairo her PCP, since you don't treat her for that.     Please advise.

## 2014-03-02 NOTE — Telephone Encounter (Signed)
She will need to take Levemir if her morning readings are over 130

## 2014-03-03 NOTE — Telephone Encounter (Signed)
Left instructions on patients voicemail.

## 2014-03-31 ENCOUNTER — Telehealth: Payer: Self-pay | Admitting: Endocrinology

## 2014-03-31 NOTE — Telephone Encounter (Signed)
Message left on my machine that her FBSs are still high.  Last 5 days:  250, 225, 179, 175, 172, and today: 157.  She is taking Levemir: AM: 18u, PM: 30u.  Also taking Humalog ac meals, and Victoza.  She wants to know if she should increase her Levemir.

## 2014-03-31 NOTE — Telephone Encounter (Signed)
Increase Levemir to 34. Also she needs to make followup appointment with me

## 2014-04-01 NOTE — Telephone Encounter (Signed)
Message left on her machine to increase dose of levemir to 34 and to make an appt. With Dr. Dwyane Dee ASAP

## 2014-04-10 ENCOUNTER — Other Ambulatory Visit: Payer: Self-pay | Admitting: Internal Medicine

## 2014-04-14 NOTE — Telephone Encounter (Signed)
Faxed script back to [place]...lmb 

## 2014-05-01 ENCOUNTER — Ambulatory Visit: Payer: 59 | Admitting: Internal Medicine

## 2014-05-18 ENCOUNTER — Ambulatory Visit: Payer: 59 | Attending: Internal Medicine

## 2014-06-04 ENCOUNTER — Telehealth: Payer: Self-pay

## 2014-06-04 NOTE — Telephone Encounter (Signed)
LVM to schedule 75mth a1c fu appt with PCP

## 2014-06-15 ENCOUNTER — Other Ambulatory Visit: Payer: 59

## 2014-06-15 ENCOUNTER — Other Ambulatory Visit: Payer: Self-pay | Admitting: *Deleted

## 2014-06-15 DIAGNOSIS — IMO0001 Reserved for inherently not codable concepts without codable children: Secondary | ICD-10-CM

## 2014-06-15 DIAGNOSIS — E1165 Type 2 diabetes mellitus with hyperglycemia: Principal | ICD-10-CM

## 2014-06-16 ENCOUNTER — Other Ambulatory Visit (INDEPENDENT_AMBULATORY_CARE_PROVIDER_SITE_OTHER): Payer: Self-pay

## 2014-06-16 ENCOUNTER — Other Ambulatory Visit: Payer: Self-pay

## 2014-06-16 DIAGNOSIS — E1165 Type 2 diabetes mellitus with hyperglycemia: Principal | ICD-10-CM

## 2014-06-16 DIAGNOSIS — IMO0001 Reserved for inherently not codable concepts without codable children: Secondary | ICD-10-CM

## 2014-06-16 LAB — LIPID PANEL
Cholesterol: 114 mg/dL (ref 0–200)
HDL: 31.3 mg/dL — AB (ref >39.00)
LDL Cholesterol: 46 mg/dL (ref 0–99)
NonHDL: 82.7
Total CHOL/HDL Ratio: 4
Triglycerides: 184 mg/dL — ABNORMAL HIGH (ref 0.0–149.0)
VLDL: 36.8 mg/dL (ref 0.0–40.0)

## 2014-06-16 LAB — COMPREHENSIVE METABOLIC PANEL
ALT: 24 U/L (ref 0–35)
AST: 22 U/L (ref 0–37)
Albumin: 4.1 g/dL (ref 3.5–5.2)
Alkaline Phosphatase: 60 U/L (ref 39–117)
BILIRUBIN TOTAL: 0.5 mg/dL (ref 0.2–1.2)
BUN: 15 mg/dL (ref 6–23)
CALCIUM: 9.8 mg/dL (ref 8.4–10.5)
CHLORIDE: 103 meq/L (ref 96–112)
CO2: 26 mEq/L (ref 19–32)
Creatinine, Ser: 0.7 mg/dL (ref 0.4–1.2)
GFR: 99.01 mL/min (ref >60.00)
Glucose, Bld: 111 mg/dL — ABNORMAL HIGH (ref 70–99)
Potassium: 3.5 mEq/L (ref 3.5–5.1)
Sodium: 135 mEq/L (ref 135–145)
Total Protein: 7.6 g/dL (ref 6.0–8.3)

## 2014-06-16 LAB — HEMOGLOBIN A1C: Hgb A1c MFr Bld: 6.7 % — ABNORMAL HIGH (ref 4.6–6.5)

## 2014-06-18 ENCOUNTER — Other Ambulatory Visit: Payer: Self-pay | Admitting: *Deleted

## 2014-06-18 ENCOUNTER — Ambulatory Visit (INDEPENDENT_AMBULATORY_CARE_PROVIDER_SITE_OTHER): Payer: 59 | Admitting: Endocrinology

## 2014-06-18 ENCOUNTER — Encounter: Payer: Self-pay | Admitting: Endocrinology

## 2014-06-18 VITALS — BP 110/70 | HR 96 | Temp 98.5°F | Resp 16 | Ht 64.0 in | Wt 232.4 lb

## 2014-06-18 DIAGNOSIS — E1165 Type 2 diabetes mellitus with hyperglycemia: Principal | ICD-10-CM

## 2014-06-18 DIAGNOSIS — IMO0001 Reserved for inherently not codable concepts without codable children: Secondary | ICD-10-CM

## 2014-06-18 DIAGNOSIS — E785 Hyperlipidemia, unspecified: Secondary | ICD-10-CM

## 2014-06-18 MED ORDER — GLIMEPIRIDE 4 MG PO TABS: 4.0000 mg | ORAL_TABLET | Freq: Every day | ORAL | Status: DC

## 2014-06-18 NOTE — Progress Notes (Signed)
Patient ID: Destiny Tucker, female   DOB: October 26, 1956, 58 y.o.   MRN: [id number]   Reason for Appointment: Diabetes follow-up   History of Present Illness   Diagnosis: Type 2 DIABETES MELITUS, date of diagnosis:  08/2010     Previous history: She has been on various regimens for her diabetes including Byetta in the past. Because of progressively higher fasting readings she was started on Levemir insulin in 2013 She usually tends to have relatively high fasting readings  Recent history: Her blood sugars have been much better controlled and she has not been working Even though she is checking her blood sugar only fasting she has been compliant with all her medications and Victoza Appears to be taking significantly more Humalog at mealtimes than on her last visit even though she has not monitored postprandial readings Also the help of the nurse educator she has titrated up her evening Levemir insulin to get morning sugars down She also thinks that she has less stress She has been active although not doing any formal exercise Has not lost weight but mostly watching her diet and also avoiding frequent snacks Only occasionally will have some snacks like popcorn or ice cream at night Her A1c is significantly better than usual     Oral hypoglycemic drugs: Amaryl hs, Metformin 1g hs      Side effects from medications: None Insulin regimen:  Humalog 12-14 before meals, Levemir 18 in am and 34 units at night            Monitors blood glucose:  1-2 times a day.    Glucometer: One Touch          Blood Glucose readings from meter download: Fasting range 107-223 with median 143. Blood sugars are from mid June Blood sugars from [employer] meter showed range 118-179 with average 152 for 30 days  Hypoglycemia:  none         Meals: 3 meals per day: Breakfast at [employer]; lunch 7 pm; snacks at times, occasional ice cream       Physical activity:  working outside           Google visit: Most recent: At  diagnosis and with nurse educator periodically      Weight control:  Wt Readings from Last 3 Encounters:  06/18/14 232 lb 6.4 oz (105.416 kg)  01/16/14 234 lb 12.8 oz (106.505 kg)  11/06/13 233 lb 3.2 oz (034.742 kg)        Complications: none     Diabetes labs:  Lab Results  Component Value Date   HGBA1C 6.7* 06/16/2014   HGBA1C 8.0* 01/12/2014   HGBA1C 7.9* 10/01/2013   Lab Results  Component Value Date   MICROALBUR 0.3 10/01/2013   LDLCALC 46 06/16/2014   CREATININE 0.7 06/16/2014      Medication List       This list is accurate as of: 06/18/14  2:12 PM.  Always use your most recent med list.               ASPIR-LOW 81 MG EC tablet  Generic drug:  aspirin  TAKE 1 TABLET BY MOUTH ONCE DAILY     B-D SINGLE USE SWABS REGULAR Pads  USE AS DIRECTED TWICE DAILY     clonazePAM 1 MG tablet  Commonly known as:  KLONOPIN  TAKE 1 TABLET BY MOUTH TWICE DAILY AS NEEDED FOR ANXIETY     docusate sodium 100 MG capsule  Commonly known as:  COLACE  Take 100 mg by mouth daily.     fenofibrate micronized 134 MG capsule  Commonly known as:  LOFIBRA  Take 1 by mouth daily     glimepiride 4 MG tablet  Commonly known as:  AMARYL  Take 4 mg by mouth daily with breakfast.     glucose blood test strip  Use to test up to 3 times daily     Insulin Detemir 100 UNIT/ML Pen  Commonly known as:  LEVEMIR  Inject 18-34 Units into the skin 2 (two) times daily. Takes 18 in am and 34 units at night     insulin lispro 100 UNIT/ML cartridge  Commonly known as:  HUMALOG  Inject 4-8 Units into the skin 3 (three) times daily with meals. Patient tries to gage dose depending on carbs     Insulin Syringe-Needle U-100 31G X 5/16" 0.3 ML Misc  Commonly known as:  B-D INSULIN SYRINGE  Use 2 syringes daily with Levemir Insulin     metFORMIN 1000 MG tablet  Commonly known as:  GLUCOPHAGE  TAKE 1 TABLET BY MOUTH once a  DAy  AS DIRECTED     NOVOFINE 32G X 6 MM Misc  Generic drug:  Insulin  Pen Needle  USE 3 TIMES A DAY     ONETOUCH DELICA LANCETS Misc  Use up to three times a day     ONETOUCH DELICA LANCETS 95K Misc  USE TO TEST UP TO 3 TIMES DAILY     sertraline 100 MG tablet  Commonly known as:  ZOLOFT  TAKE 1 TABLET BY MOUTH DAILY     simvastatin 20 MG tablet  Commonly known as:  ZOCOR  TAKE 1 TABLET BY MOUTH EVERY NIGHT AT BEDTIME     triamterene-hydrochlorothiazide 37.5-25 MG per capsule  Commonly known as:  DYAZIDE  TAKE 1 CAPSULE BY MOUTH ONCE EVERY MORNING     V-GO 40 Kit  Use one per day     VICTOZA 18 MG/3ML Sopn  Generic drug:  Liraglutide  Inject 1.2 Units into the skin daily. Plus 3 clicks        Allergies:  Allergies  Allergen Reactions  . Codeine     Past Medical History  Diagnosis Date  . OSTEOPENIA   . TOBACCO USE, QUIT   . HYPERTENSION   . HYPERLIPIDEMIA   . DEPRESSION   . ANXIETY   . ALLERGIC RHINITIS   . Diabetes mellitus   . Internal hemorrhoids without mention of complication 93/26/7124    Colonoscopy--Dr. Fuller Plan , pts. states hemorrhoids are uncomfortable    Past Surgical History  Procedure Laterality Date  . Unilateral salpingo-oophorectomy    . Mohs surgery      Precancerous skin lesion  . Removal of fallopion tube & ovary  1986  . Removal of cyst & other ovary  09/2004  . Anus surgery      Family History  Problem Relation Age of Onset  . Hypertension Mother   . Hyperlipidemia Mother   . Nephrolithiasis Mother   . Hypertension Father   . Hyperlipidemia Father   . Nephrolithiasis Father   . Diabetes Maternal Uncle   . Diabetes Mother     Social History:  reports that she quit smoking about 4 years ago. She does not have any smokeless tobacco history on file. She reports that she drinks alcohol. She reports that she does not use illicit drugs.  Review of Systems:  Hypertension:  mild and controlled with only Dyazide  Lipids:  She is only on 20 mg of simvastatin with good control. Triglycerides under  200, currently taking fenofibrate    LABS:  Appointment on 06/16/2014  Component Date Value Ref Range Status  . Hemoglobin A1C 06/16/2014 6.7* 4.6 - 6.5 % Final   Glycemic Control Guidelines for People with Diabetes:Non Diabetic:  <6%Goal of Therapy: <7%Additional Action Suggested:  >8%   . Sodium 06/16/2014 135  135 - 145 mEq/L Final  . Potassium 06/16/2014 3.5  3.5 - 5.1 mEq/L Final  . Chloride 06/16/2014 103  96 - 112 mEq/L Final  . CO2 06/16/2014 26  19 - 32 mEq/L Final  . Glucose, Bld 06/16/2014 111* 70 - 99 mg/dL Final  . BUN 06/16/2014 15  6 - 23 mg/dL Final  . Creatinine, Ser 06/16/2014 0.7  0.4 - 1.2 mg/dL Final  . Total Bilirubin 06/16/2014 0.5  0.2 - 1.2 mg/dL Final  . Alkaline Phosphatase 06/16/2014 60  39 - 117 U/L Final  . AST 06/16/2014 22  0 - 37 U/L Final  . ALT 06/16/2014 24  0 - 35 U/L Final  . Total Protein 06/16/2014 7.6  6.0 - 8.3 g/dL Final  . Albumin 06/16/2014 4.1  3.5 - 5.2 g/dL Final  . Calcium 06/16/2014 9.8  8.4 - 10.5 mg/dL Final  . GFR 06/16/2014 99.01  >60.00 mL/min Final  . Cholesterol 06/16/2014 114  0 - 200 mg/dL Final   ATP III Classification       Desirable:  < 200 mg/dL               Borderline High:  200 - 239 mg/dL          High:  > = 240 mg/dL  . Triglycerides 06/16/2014 184.0* 0.0 - 149.0 mg/dL Final   Normal:  <150 mg/dLBorderline High:  150 - 199 mg/dL  . HDL 06/16/2014 31.30* >39.00 mg/dL Final  . VLDL 06/16/2014 36.8  0.0 - 40.0 mg/dL Final  . LDL Cholesterol 06/16/2014 46  0 - 99 mg/dL Final  . Total CHOL/HDL Ratio 06/16/2014 4   Final                  Men          Women1/2 Average Risk     3.4          3.3Average Risk          5.0          4.42X Average Risk          9.6          7.13X Average Risk          15.0          11.0                      . NonHDL 06/16/2014 82.70   Final   NOTE:  Non-HDL goal should be 30 mg/dL higher than patient's LDL goal (i.e. LDL goal of < 70 mg/dL, would have non-HDL goal of < 100 mg/dL)      Examination:   BP 110/70  Pulse 96  Temp(Src) 98.5 F (36.9 C)  Resp 16  Ht $R'5\' 4"'yZ$  (1.626 m)  Wt 232 lb 6.4 oz (105.416 kg)  BMI 39.87 kg/m2  SpO2 95%  Body mass index is 39.87 kg/(m^2).   No ankle edema  ASSESSMENT/ PLAN:   Diabetes type 2   Blood glucose control is significantly better with increasing her insulin  overall and also probably from less stress and better lifestyle after she quit work Although she has not lost any weight she is trying to be little active Her compliance is fairly good except she is not leaning blood sugars after meals Will continue the same regimen but she can alternate fasting and postprandial readings Also given her a blood sugar chart to keep a record Forms filled out for patient assistance  HYPERLIPIDEMIA: She  has a low HDL and but triglycerides are better  Hypertension: This is mild and blood pressure is low normal. Since her potassium is also low normal she will reduce her Dyazide to every other day  Destiny Tucker 06/18/2014, 2:12 PM

## 2014-06-18 NOTE — Patient Instructions (Addendum)
Please check blood sugars at least half the time about 2 hours after any meal and 3-4 times per week on waking up. Please bring blood sugar monitor or diary to each visit  If am sugar stays > 140 go to 36 Levemir at night  Reduce diuretic to every 2 days  May try to leave off Amaryl

## 2014-07-16 ENCOUNTER — Telehealth: Payer: Self-pay | Admitting: Endocrinology

## 2014-07-16 NOTE — Telephone Encounter (Signed)
Patient stated that she will be here next weeek to pick up her insulin.

## 2014-07-20 NOTE — Telephone Encounter (Signed)
Pt thinks she has a uti please advise

## 2014-07-21 ENCOUNTER — Telehealth: Payer: Self-pay | Admitting: Internal Medicine

## 2014-07-21 NOTE — Telephone Encounter (Signed)
LVM for pt to return our call if sx become worse.

## 2014-07-21 NOTE — Telephone Encounter (Signed)
Patient Information:  Caller Name: Braylee  Phone: [phone]  Patient: Destiny Tucker  Gender: Female  DOB: 1956/01/18  Age: 58 Years  PCP: Gwendolyn Grant (Adults only)  Office Follow Up:  Does the office need to follow up with this patient?: Yes  Instructions For The Office: Please call this pt. She wants to be treated for a UTI over the phone. Has no insurance and in a training class starting today. Pt. already on Amoxicillin for a Dental Problem but it is not helping.  RN Note:  Pt. is asking to be treated for a UTI with someting that is not too expensive(no insurance). Pt. on Amoxil tid from her Dentist but that is not helping her UTI. Pt. states she will stop the Amoxil to take a medication that will help her. Pt. is taking Azo-Standard now. Please advise.  Symptoms  Reason For Call & Symptoms: Pt. with symptoms of UTI. Frequency and pressure. Pt. on Amoxil for her teeth but it  is not helping with symptoms of UTI. Pt. does not have insurance and is asking to be treated over the phone. In a training class now. States Sulfa drugs make her sick.  Reviewed Health History In EMR: Yes  Reviewed Medications In EMR: Yes  Reviewed Allergies In EMR: Yes  Reviewed Surgeries / Procedures: Yes  Date of Onset of Symptoms: 07/18/2014  Guideline(s) Used:  Urination Pain - Female  Disposition Per Guideline:   See Today in Office  Reason For Disposition Reached:   Painful urination AND EITHER frequency or urgency  Advice Given:  Fluids:   Drink extra fluids. Drink 8-10 glasses of liquids a day (Reason: to produce a dilute, non-irritating urine).  Warm Saline SITZ Baths to Reduce Pain:  Sit in a warm saline bath for 20 minutes to cleanse the area and to reduce pain. Add 2 oz. of table salt or baking soda to a tub of water.  Call Back If:  You become worse.  Patient Will Follow Care Advice:  YES

## 2014-07-24 ENCOUNTER — Other Ambulatory Visit: Payer: Self-pay

## 2014-07-24 ENCOUNTER — Ambulatory Visit (INDEPENDENT_AMBULATORY_CARE_PROVIDER_SITE_OTHER): Payer: Self-pay | Admitting: Internal Medicine

## 2014-07-24 ENCOUNTER — Encounter: Payer: Self-pay | Admitting: Internal Medicine

## 2014-07-24 VITALS — BP 110/80 | HR 99 | Temp 98.4°F | Resp 12 | Wt 233.5 lb

## 2014-07-24 DIAGNOSIS — N329 Bladder disorder, unspecified: Secondary | ICD-10-CM

## 2014-07-24 DIAGNOSIS — R350 Frequency of micturition: Secondary | ICD-10-CM

## 2014-07-24 DIAGNOSIS — R3915 Urgency of urination: Secondary | ICD-10-CM

## 2014-07-24 DIAGNOSIS — R3911 Hesitancy of micturition: Secondary | ICD-10-CM

## 2014-07-24 LAB — POCT URINALYSIS DIPSTICK
Bilirubin, UA: NEGATIVE
GLUCOSE UA: NEGATIVE
Ketones, UA: NEGATIVE
Leukocytes, UA: NEGATIVE
Nitrite, UA: NEGATIVE
Protein, UA: NEGATIVE
RBC UA: NEGATIVE
Spec Grav, UA: 1.025
pH, UA: 6

## 2014-07-24 MED ORDER — PHENAZOPYRIDINE HCL 200 MG PO TABS: 200.0000 mg | ORAL_TABLET | Freq: Three times a day (TID) | ORAL | Status: DC | PRN

## 2014-07-24 NOTE — Progress Notes (Signed)
Pre visit review using our clinic review tool, if applicable. No additional management support is needed unless otherwise documented below in the visit note. 

## 2014-07-24 NOTE — Progress Notes (Signed)
   Subjective:    Patient ID: Destiny Tucker, female    DOB: Aug 10, 1956, 58 y.o.   MRN: [id number]  HPI   Her symptoms began 07/17/14 as sharp pain across the lower pelvic area which lasted several hours. This resolved with AZO and Advil.  Since that time she's had hesitancy, urgency, frequency, oliguria, and nocturia up to every one hour.  2-3 days ago she had frank diarrhea which she relates to eating greens the evening before  She is a type II diabetic; fasting blood sugars range 130-170. She is on Glimiperide as well as insulin from Dr. Dwyane Dee, endocrinologist   Review of Systems   She denies persistent nausea or vomiting. She's had no constipation, melena, rectal bleeding  She also denies dysuria, hematuria, pyuria, flank pain, fever, chills, or sweats     Objective:   Physical Exam   Positive pertinent findings include:  As per CDC Guidelines ,Epic documents  Severe obesity as being present . There is accentuation of the upper thoracic spine curvature There is asymmetry of the posterior thoracic musculature; left  greater than the right Abdomen is massive. Initially ileus was suggested by isolated and tinkling bowel sounds. There was no tenderness to palpation or light percussion over abdomen & it was  soft. Following palpation; she was found to have active bowel sounds.  General appearance :adequately nourished; in no distress. Eyes: No conjunctival inflammation or scleral icterus is present. Oral exam: Dental hygiene is good. Lips and gums are healthy appearing.There is no oropharyngeal erythema or exudate noted.  Heart:  Normal rate and regular rhythm. S1 and S2 normal without gallop, murmur, click, rub or other extra sounds   Lungs:Chest clear to auscultation; no wheezes, rhonchi,rales ,or rubs present.No increased work of breathing.  Abdomen: No guarding or rebound. No flank tenderness to percussion. Skin:Warm & dry.  Intact without suspicious lesions or rashes ; no  jaundice or tenting Lymphatic: No lymphadenopathy is noted about the head, neck, axilla             Assessment & Plan:  #1 hesitancy, urgency, frequency, nocturia  #2 pelvic pain, resolved  #3 initial clinical findings to suggest ileus but this is not persistent on followup exam  #4 diabetes  Plan: See orders and recommendations

## 2014-07-24 NOTE — Patient Instructions (Addendum)
Drink as much nondairy fluids as possible. Avoid spicy foods or alcohol as  these may aggravate the bladder. Do not take decongestants. Avoid narcotics if possible.  Myrbrtriq 25 mg daily( Lot [id number]; Exp 08/17) as trial for OAB

## 2014-07-25 LAB — URINE CULTURE: Colony Count: 6000

## 2014-07-28 ENCOUNTER — Telehealth: Payer: Self-pay | Admitting: *Deleted

## 2014-07-28 NOTE — Telephone Encounter (Signed)
Pt is wanting to have her UA & culture repeated next week after she finish amoxillan. She stated that she is still having urinary sxsx, pressure. She think by her taking the antibiotic suppress the culture results....Johny Chess

## 2014-07-28 NOTE — Telephone Encounter (Signed)
Left smg on triage think she has a UTI. Saw Dr. Linna Darner on Friday wanting to see has the culture came back. Will be out so can leave msg on vm. called pt back no answer LMOM md received culture negative for UTI...Johny Chess

## 2014-07-28 NOTE — Telephone Encounter (Signed)
See last OV & labs:6000 colonies, insignificant growth Val can OK this or not;I'd opt for Urology referral

## 2014-07-31 ENCOUNTER — Ambulatory Visit (INDEPENDENT_AMBULATORY_CARE_PROVIDER_SITE_OTHER): Payer: Self-pay | Admitting: Internal Medicine

## 2014-07-31 ENCOUNTER — Encounter: Payer: Self-pay | Admitting: Internal Medicine

## 2014-07-31 VITALS — BP 160/72 | HR 94 | Temp 97.6°F | Resp 22 | Ht 64.0 in | Wt 230.0 lb

## 2014-07-31 DIAGNOSIS — R300 Dysuria: Secondary | ICD-10-CM

## 2014-07-31 DIAGNOSIS — N329 Bladder disorder, unspecified: Secondary | ICD-10-CM

## 2014-07-31 LAB — POCT URINALYSIS DIPSTICK
BILIRUBIN UA: NEGATIVE
Glucose, UA: NEGATIVE
Ketones, UA: NEGATIVE
Leukocytes, UA: NEGATIVE
Nitrite, UA: NEGATIVE
PH UA: 7.5
Protein, UA: 0.15
Spec Grav, UA: 1.025
Urobilinogen, UA: 4

## 2014-07-31 MED ORDER — TAMSULOSIN HCL 0.4 MG PO CAPS: 0.4000 mg | ORAL_CAPSULE | Freq: Every day | ORAL | Status: DC

## 2014-07-31 MED ORDER — NAPROXEN 500 MG PO TABS: 500.0000 mg | ORAL_TABLET | Freq: Two times a day (BID) | ORAL | Status: DC

## 2014-07-31 NOTE — Progress Notes (Signed)
   Subjective:    Patient ID: Destiny Tucker, female    DOB: 1956/06/11, 58 y.o.   MRN: [id number]  HPI The patient is a 58 YO female who is coming in today for unresolved urinary symptoms. She was seen about 1 week ago for urinary symptoms while she was on amoxicillin for gum disease. Her urine culture from that visit was negative but she is still having symptoms. She is not taking the amoxicillin at this time and stopped about 2-3 days ago. She is having burning and urgency and the urge to go when she does not have to go. She is trying to drink water but given her class schedule she is unable to drink much. She denies fevers. She has not had much appetite but is not nauseous and has not thrown up. She is having mild side pain. She denies diarrhea or constipation. She has diabetes but no increase in her sugars recently.   Review of Systems  Constitutional: Negative for fever, chills, activity change, appetite change and fatigue.  Respiratory: Negative for chest tightness and shortness of breath.   Cardiovascular: Negative for chest pain, palpitations and leg swelling.  Gastrointestinal: Positive for abdominal pain. Negative for nausea, vomiting, diarrhea, constipation and abdominal distention.  Endocrine: Negative for polydipsia, polyphagia and polyuria.  Genitourinary: Positive for dysuria, urgency and flank pain. Negative for frequency, hematuria, enuresis and difficulty urinating.  Musculoskeletal: Positive for back pain.  Neurological: Negative for dizziness, weakness and light-headedness.       Objective:   Physical Exam  Vitals reviewed. Constitutional: She appears well-developed and well-nourished.  In mild distress  HENT:  Head: Normocephalic and atraumatic.  Cardiovascular: Normal rate and regular rhythm.   No murmur heard. Pulmonary/Chest: Effort normal and breath sounds normal. No respiratory distress. She has no wheezes. She has no rales. She exhibits no tenderness.    Abdominal:  Minimal flank pain, minimal pain in lower abdomen/suprapubic region. NO guarding or rebound. Good bowel sounds.   Musculoskeletal:  Some left flank tenderness but no tenderness of the right side.   Skin: Skin is warm and dry.      Assessment & Plan:

## 2014-07-31 NOTE — Telephone Encounter (Signed)
Pt called in this morning and said that she is not any better.  She is still hurting.  She wanted a call back from nurse to see what she needs to do.    She seen dr Linna Darner and he said that she did not have a uti but she thinks there is something wrong.    Thank you

## 2014-07-31 NOTE — Telephone Encounter (Signed)
LMTCB

## 2014-07-31 NOTE — Progress Notes (Signed)
Pre visit review using our clinic review tool, if applicable. No additional management support is needed unless otherwise documented below in the visit note. 

## 2014-07-31 NOTE — Telephone Encounter (Signed)
Patient called back.  She is requesting antibiotic.

## 2014-07-31 NOTE — Telephone Encounter (Signed)
appt has been scheduled w/ dr Doug Sou today @ 3:30 pm

## 2014-07-31 NOTE — Patient Instructions (Signed)
We will have you take tamsulosin 1 pill daily for the next 4 weeks to help pass the kidney stone. We have also given you a medicine called naproxen to take 2 times per day to help with the pain for the next 2 weeks.   We think that you have a kidney stone and it is important to drink about 8-10 glasses of water daily.   Kidney Stones Kidney stones (urolithiasis) are deposits that form inside your kidneys. The intense pain is caused by the stone moving through the urinary tract. When the stone moves, the ureter goes into spasm around the stone. The stone is usually passed in the urine.  CAUSES   A disorder that makes certain neck glands produce too much parathyroid hormone (primary hyperparathyroidism).  A buildup of uric acid crystals, similar to gout in your joints.  Narrowing (stricture) of the ureter.  A kidney obstruction present at birth (congenital obstruction).  Previous surgery on the kidney or ureters.  Numerous kidney infections. SYMPTOMS   Feeling sick to your stomach (nauseous).  Throwing up (vomiting).  Blood in the urine (hematuria).  Pain that usually spreads (radiates) to the groin.  Frequency or urgency of urination. DIAGNOSIS   Taking a history and physical exam.  Blood or urine tests.  CT scan.  Occasionally, an examination of the inside of the urinary bladder (cystoscopy) is performed. TREATMENT   Observation.  Increasing your fluid intake.  Extracorporeal shock wave lithotripsy--This is a noninvasive procedure that uses shock waves to break up kidney stones.  Surgery may be needed if you have severe pain or persistent obstruction. There are various surgical procedures. Most of the procedures are performed with the use of small instruments. Only small incisions are needed to accommodate these instruments, so recovery time is minimized. The size, location, and chemical composition are all important variables that will determine the proper choice of  action for you. Talk to your health care provider to better understand your situation so that you will minimize the risk of injury to yourself and your kidney.  HOME CARE INSTRUCTIONS   Drink enough water and fluids to keep your urine clear or pale yellow. This will help you to pass the stone or stone fragments.  Strain all urine through the provided strainer. Keep all particulate matter and stones for your health care provider to see. The stone causing the pain may be as small as a grain of salt. It is very important to use the strainer each and every time you pass your urine. The collection of your stone will allow your health care provider to analyze it and verify that a stone has actually passed. The stone analysis will often identify what you can do to reduce the incidence of recurrences.  Only take over-the-counter or prescription medicines for pain, discomfort, or fever as directed by your health care provider.  Make a follow-up appointment with your health care provider as directed.  Get follow-up X-rays if required. The absence of pain does not always mean that the stone has passed. It may have only stopped moving. If the urine remains completely obstructed, it can cause loss of kidney function or even complete destruction of the kidney. It is your responsibility to make sure X-rays and follow-ups are completed. Ultrasounds of the kidney can show blockages and the status of the kidney. Ultrasounds are not associated with any radiation and can be performed easily in a matter of minutes. SEEK MEDICAL CARE IF:  You experience pain that  is progressive and unresponsive to any pain medicine you have been prescribed. SEEK IMMEDIATE MEDICAL CARE IF:   Pain cannot be controlled with the prescribed medicine.  You have a fever or shaking chills.  The severity or intensity of pain increases over 18 hours and is not relieved by pain medicine.  You develop a new onset of abdominal pain.  You feel  faint or pass out.  You are unable to urinate. MAKE SURE YOU:   Understand these instructions.  Will watch your condition.  Will get help right away if you are not doing well or get worse. Document Released: 11/06/2005 Document Revised: 07/09/2013 Document Reviewed: 04/09/2013 [facility] Patient Information 2015 [place], [place]. This information is not intended to replace advice given to you by your health care provider. Make sure you discuss any questions you have with your health care provider.

## 2014-08-02 DIAGNOSIS — N329 Bladder disorder, unspecified: Secondary | ICD-10-CM | POA: Insufficient documentation

## 2014-08-02 NOTE — Assessment & Plan Note (Signed)
Patient thought she was still having a bladder infection but repeat POC dipstick of her urine suggests no signs of infection and some blood. There is family history of kidney stone and with the flank pain on the left is the most likely diagnosis. She does not have insurance at this time and discussed with her the risks and benefits of getting a CT scan of her abdomen without contrast to look for stones or other etiology. She understands but wants to wait as the CT scan is costly and she would like to try conservative management of a likely stone. Explained the risk to her of waiting that if a stone, could be too large to pass and may cause further problems such as AKI, worsening pain, obstruction. She understands. Alternate diagnosis would include diverticulitis however she is not experiencing any bowel symptoms at this time and seems less likely, no fever.   Will rx naproxen for pain to be taken BID and tamsulosin to aid in expulsion of stones. If she has fevers, is unable to pass urine for 6 hours, develops worsening abdominal pain, vomiting, diarrhea she should call our office or seek medical advice immediately. If she is not improved and no stone passes within 2 weeks she will reconsider getting the imaging study.

## 2014-08-04 ENCOUNTER — Telehealth: Payer: Self-pay | Admitting: *Deleted

## 2014-08-04 NOTE — Telephone Encounter (Signed)
Left msg on triage stating saw Dr. Doug Sou on Friday wanting to know does she need to continue taking the flomax...Johny Chess

## 2014-08-04 NOTE — Telephone Encounter (Signed)
Called pt she stated after Sat she hasn't had any pain sxs feel a lot better. Advise pt with md response to keep taking flomax x's 1 month...Johny Chess

## 2014-08-04 NOTE — Telephone Encounter (Signed)
Is she feeling any better? I would recommend taking the flomax for 1 month then stopping.   Dr. Doug Sou

## 2014-09-14 ENCOUNTER — Telehealth: Payer: Self-pay | Admitting: Nutrition

## 2014-09-14 NOTE — Telephone Encounter (Signed)
Increase Levemir to 20 units in the morning and 36 at night On her next visit she will need to show me some readings 2 hours after meals also

## 2014-09-14 NOTE — Telephone Encounter (Signed)
Blood sugars going high for no reason.  30 day average: 173, 14 day average: 179 FBSs 150-195, acS: 160-190.  Taking Levemire:18uAM, and 34HS, Victoza 1.2,  Humalog acmeals per sliding scale and carbs.

## 2014-09-15 NOTE — Telephone Encounter (Signed)
Message left on her machine with Dr. Ronnie Derby orders.

## 2014-09-22 ENCOUNTER — Other Ambulatory Visit: Payer: Self-pay | Admitting: Endocrinology

## 2014-10-09 ENCOUNTER — Telehealth: Payer: Self-pay

## 2014-10-09 DIAGNOSIS — M25562 Pain in left knee: Secondary | ICD-10-CM

## 2014-10-09 NOTE — Telephone Encounter (Signed)
Pt lvm and I saw this message at the same time.   Pt informed me that it is the left knee (not foot). She believes it happened after the fall she had a while back. OV was made after that incident.  Pain is worse when she stands up after sitting for a long time. Does not think that there is a foreign body.

## 2014-10-09 NOTE — Telephone Encounter (Signed)
Need to know which foot and where pain is located Also need to know: was there an injury? ?possible foreign body? Other problem? I will then order xray for next week as needed (sorry did not see message before now!)

## 2014-10-09 NOTE — Telephone Encounter (Signed)
Pt called and LVM. She is wanting an xray of her foot.  FYI: Pt wanted [place] to know that she is self pay

## 2014-10-12 ENCOUNTER — Other Ambulatory Visit: Payer: Self-pay | Admitting: *Deleted

## 2014-10-12 MED ORDER — GLIMEPIRIDE 4 MG PO TABS: 4.0000 mg | ORAL_TABLET | Freq: Every day | ORAL | Status: DC

## 2014-10-12 NOTE — Telephone Encounter (Signed)
Xray ordered - will notify of results after review

## 2014-10-19 ENCOUNTER — Other Ambulatory Visit: Payer: Self-pay

## 2014-10-20 ENCOUNTER — Telehealth: Payer: Self-pay | Admitting: Endocrinology

## 2014-10-20 ENCOUNTER — Ambulatory Visit (INDEPENDENT_AMBULATORY_CARE_PROVIDER_SITE_OTHER)
Admission: RE | Admit: 2014-10-20 | Discharge: 2014-10-20 | Disposition: A | Discharge status: Home / Self Care | Payer: Self-pay | Source: Ambulatory Visit | Attending: Internal Medicine | Admitting: Internal Medicine

## 2014-10-20 DIAGNOSIS — M25562 Pain in left knee: Secondary | ICD-10-CM

## 2014-10-20 NOTE — Telephone Encounter (Signed)
Pt asking if we received the needles as well in the shipment

## 2014-10-22 ENCOUNTER — Ambulatory Visit: Payer: Self-pay | Admitting: Endocrinology

## 2014-10-23 ENCOUNTER — Other Ambulatory Visit (INDEPENDENT_AMBULATORY_CARE_PROVIDER_SITE_OTHER): Payer: Self-pay

## 2014-10-23 ENCOUNTER — Other Ambulatory Visit: Payer: Self-pay | Admitting: Internal Medicine

## 2014-10-23 DIAGNOSIS — IMO0002 Reserved for concepts with insufficient information to code with codable children: Secondary | ICD-10-CM

## 2014-10-23 DIAGNOSIS — E1165 Type 2 diabetes mellitus with hyperglycemia: Secondary | ICD-10-CM

## 2014-10-25 LAB — BASIC METABOLIC PANEL
BUN: 18 mg/dL (ref 6–23)
CALCIUM: 9.5 mg/dL (ref 8.4–10.5)
CO2: 24 mEq/L (ref 19–32)
Chloride: 105 mEq/L (ref 96–112)
Creatinine, Ser: 0.6 mg/dL (ref 0.4–1.2)
GFR: 106.41 mL/min (ref >60.00)
Glucose, Bld: 156 mg/dL — ABNORMAL HIGH (ref 70–99)
Potassium: 4 mEq/L (ref 3.5–5.1)
Sodium: 139 mEq/L (ref 135–145)

## 2014-10-25 LAB — HEMOGLOBIN A1C: HEMOGLOBIN A1C: 7.1 % — AB (ref 4.6–6.5)

## 2014-10-26 ENCOUNTER — Encounter: Payer: Self-pay | Admitting: Endocrinology

## 2014-10-26 ENCOUNTER — Other Ambulatory Visit: Payer: Self-pay | Admitting: *Deleted

## 2014-10-26 ENCOUNTER — Ambulatory Visit (INDEPENDENT_AMBULATORY_CARE_PROVIDER_SITE_OTHER): Payer: Self-pay | Admitting: Endocrinology

## 2014-10-26 VITALS — BP 120/74 | HR 95 | Temp 98.2°F | Resp 16 | Ht 64.0 in | Wt 238.6 lb

## 2014-10-26 DIAGNOSIS — E1165 Type 2 diabetes mellitus with hyperglycemia: Secondary | ICD-10-CM

## 2014-10-26 DIAGNOSIS — IMO0002 Reserved for concepts with insufficient information to code with codable children: Secondary | ICD-10-CM

## 2014-10-26 MED ORDER — METFORMIN HCL 1000 MG PO TABS: 1000.0000 mg | ORAL_TABLET | Freq: Two times a day (BID) | ORAL | Status: DC

## 2014-10-26 NOTE — Progress Notes (Signed)
Patient ID: Destiny Tucker, female   DOB: 05-02-1956, 58 y.o.   MRN: [id number]   Reason for Appointment: Diabetes follow-up   History of Present Illness   Diagnosis: Type 2 DIABETES MELITUS, date of diagnosis:  08/2010     Previous history: She has been on various regimens for her diabetes including [place] in the past. Because of progressively higher fasting readings she was started on Levemir insulin in 2013 She usually tends to have relatively high fasting readings  Recent history: Her blood sugars have been much better controlled and she has not been working Even though on our last visit she had improved blood sugars and A1c below 7% her glucoses recently higher Again is checking her blood sugar only fasting despite reminders to check more readings after meals; she does not do this because of cost or because she is eating snacks after meals Although her A1c is not much higher her sugars have been higher only in the last few days in the mornings She was told to keep a record of her blood sugars but she has not done so She says thatshe has been compliant with all her medications and Victoza However she is not watching her diet and has gained significant amount of weight also     Oral hypoglycemic drugs: Amaryl hs, Metformin 1g hs      Side effects from medications: None Insulin regimen:  Humalog 14 before meals, Levemir 20 in am and 36 units at night            Monitors blood glucose:  1-2 times a day.    Glucometer: [employer]        Blood Glucose readings from meter:  Fasting range recently 170-227, 243    Blood sugars from [employer] meter showed  Average 176 for 30 days  Hypoglycemia:  none         Meals: 3 meals per day: Breakfast at [employer]; lunch 7 pm; snacks at times, occasional ice cream       Physical activity:  working outside           Google visit: Most recent: At diagnosis and with nurse educator periodically      Weight control:  Wt Readings from Last 3 Encounters:   10/26/14 238 lb 9.6 oz (108.228 kg)  07/31/14 230 lb (104.327 kg)  07/24/14 233 lb 8 oz (782.956 kg)        Complications: none     Diabetes labs:  Lab Results  Component Value Date   HGBA1C 7.1* 10/23/2014   HGBA1C 6.7* 06/16/2014   HGBA1C 8.0* 01/12/2014   Lab Results  Component Value Date   MICROALBUR 0.3 10/01/2013   LDLCALC 46 06/16/2014   CREATININE 0.6 10/23/2014      Medication List       This list is accurate as of: 10/26/14 11:43 AM.  Always use your most recent med list.               amoxicillin 500 MG tablet  Commonly known as:  AMOXIL  Take 500 mg by mouth 3 (three) times daily.     ASPIR-LOW 81 MG EC tablet  Generic drug:  aspirin  TAKE 1 TABLET BY MOUTH ONCE DAILY     B-D SINGLE USE SWABS REGULAR Pads  USE AS DIRECTED TWICE DAILY     clonazePAM 1 MG tablet  Commonly known as:  KLONOPIN  TAKE 1 TABLET BY MOUTH TWICE DAILY AS NEEDED FOR ANXIETY  fenofibrate micronized 134 MG capsule  Commonly known as:  LOFIBRA  Take 1 by mouth daily     glimepiride 4 MG tablet  Commonly known as:  AMARYL  Take 1 tablet (4 mg total) by mouth daily with breakfast.     glucose blood test strip  Use to test up to 3 times daily     Insulin Detemir 100 UNIT/ML Pen  Commonly known as:  LEVEMIR  Inject 20-36 Units into the skin 2 (two) times daily. Takes 20 in am and 36 units at night     insulin lispro 100 UNIT/ML cartridge  Commonly known as:  HUMALOG  Inject 4-8 Units into the skin 3 (three) times daily with meals. Patient tries to gage dose depending on carbs     Insulin Syringe-Needle U-100 31G X 5/16" 0.3 ML Misc  Commonly known as:  B-D INSULIN SYRINGE  Use 2 syringes daily with Levemir Insulin     metFORMIN 1000 MG tablet  Commonly known as:  GLUCOPHAGE  TAKE 1 TABLET BY MOUTH once a  DAy  AS DIRECTED     naproxen 500 MG tablet  Commonly known as:  NAPROSYN  Take 1 tablet (500 mg total) by mouth 2 (two) times daily with a meal.      NOVOFINE 32G X 6 MM Misc  Generic drug:  Insulin Pen Needle  USE 3 TIMES A DAY     ONETOUCH DELICA LANCETS Misc  Use up to three times a day     ONETOUCH DELICA LANCETS 66Y Misc  USE TO TEST UP TO 3 TIMES DAILY     phenazopyridine 200 MG tablet  Commonly known as:  PYRIDIUM  Take 1 tablet (200 mg total) by mouth 3 (three) times daily as needed for pain.     sertraline 100 MG tablet  Commonly known as:  ZOLOFT  TAKE 1 TABLET BY MOUTH ONCE DAILY     simvastatin 20 MG tablet  Commonly known as:  ZOCOR  TAKE 1 TABLET BY MOUTH EVERY NIGHT AT BEDTIME     tamsulosin 0.4 MG Caps capsule  Commonly known as:  FLOMAX  Take 1 capsule (0.4 mg total) by mouth daily.     triamterene-hydrochlorothiazide 37.5-25 MG per capsule  Commonly known as:  DYAZIDE  TAKE 1 CAPSULE BY MOUTH ONCE EVERY MORNING     VICTOZA 18 MG/3ML Sopn  Generic drug:  Liraglutide  Inject 1.2 Units into the skin daily. Plus 3 clicks        Allergies:  Allergies  Allergen Reactions  . Codeine     Past Medical History  Diagnosis Date  . OSTEOPENIA   . TOBACCO USE, QUIT   . HYPERTENSION   . HYPERLIPIDEMIA   . DEPRESSION   . ANXIETY   . ALLERGIC RHINITIS   . Diabetes mellitus   . Internal hemorrhoids without mention of complication 40/34/7425    Colonoscopy--Dr. Fuller Plan , pts. states hemorrhoids are uncomfortable    Past Surgical History  Procedure Laterality Date  . Unilateral salpingo-oophorectomy    . Mohs surgery      Precancerous skin lesion  . Removal of fallopion tube & ovary  1986  . Removal of cyst & other ovary  09/2004  . Anus surgery      Family History  Problem Relation Age of Onset  . Hypertension Mother   . Hyperlipidemia Mother   . Nephrolithiasis Mother   . Hypertension Father   . Hyperlipidemia Father   . Nephrolithiasis  Father   . Diabetes Maternal Uncle   . Diabetes Mother     Social History:  reports that she quit smoking about 4 years ago. She does not have any  smokeless tobacco history on file. She reports that she drinks alcohol. She reports that she does not use illicit drugs.  Review of Systems:  Hypertension:  mild and controlled with only Dyazide qd   Lipids: She is only on 20 mg of simvastatin with good control. Triglycerides under 200, currently taking fenofibrate  Knee pain recently  Feet hot and sensitive    LABS:  Appointment on 10/23/2014  Component Date Value Ref Range Status  . Hgb A1c MFr Bld 10/23/2014 7.1* 4.6 - 6.5 % Final   Glycemic Control Guidelines for People with Diabetes:Non Diabetic:  <6%Goal of Therapy: <7%Additional Action Suggested:  >8%   . Sodium 10/23/2014 139  135 - 145 mEq/L Final  . Potassium 10/23/2014 4.0  3.5 - 5.1 mEq/L Final  . Chloride 10/23/2014 105  96 - 112 mEq/L Final  . CO2 10/23/2014 24  19 - 32 mEq/L Final  . Glucose, Bld 10/23/2014 156* 70 - 99 mg/dL Final  . BUN 10/23/2014 18  6 - 23 mg/dL Final  . Creatinine, Ser 10/23/2014 0.6  0.4 - 1.2 mg/dL Final  . Calcium 10/23/2014 9.5  8.4 - 10.5 mg/dL Final  . GFR 10/23/2014 106.41  >60.00 mL/min Final     Examination:   BP 120/74 mmHg  Pulse 95  Temp(Src) 98.2 F (36.8 C)  Resp 16  Ht 5\' 4"  (1.626 m)  Wt 238 lb 9.6 oz (108.228 kg)  BMI 40.94 kg/m2  SpO2 96%  Body mass index is 40.94 kg/(m^2).   No ankle edema  ASSESSMENT/ PLAN:   Diabetes type 2   Blood glucose control is not as good with fasting readings significantly high recently Again she is not checking readings after meals and this was discussed She probably needs higher dose of Humalog at suppertime and if fasting readings are still high may need to increase evening Levemir Given her chair exercises to do until her knee pain gets better She will also try to watch her diet better Meanwhile will try to increase her metformin especially since her weight is going up    Patient Instructions  Please check blood sugars at least half the time about 2 hours after any meal and 3  times per week on waking up.  If sugar over 180 after meals increase Humalog 2-4 units  Please bring blood sugar monitor to each visit  Take metformin 2x daily     Yoshio Seliga 10/26/2014, 11:43 AM

## 2014-10-26 NOTE — Patient Instructions (Addendum)
Please check blood sugars at least half the time about 2 hours after any meal and 3 times per week on waking up.  If sugar over 180 after meals increase Humalog 2-4 units  Please bring blood sugar monitor to each visit  Take metformin 2x daily

## 2014-10-29 ENCOUNTER — Other Ambulatory Visit: Payer: Self-pay | Admitting: Internal Medicine

## 2014-10-29 NOTE — Telephone Encounter (Signed)
Pt called in and wanted to know if she can come off the fenofibrate micronized (LOFIBRA) 134 MG capsule [[id number]],  She said that she can not afford and wanted to see if its possible to just stop taking it?   Please advise?        Best number [phone]

## 2014-10-29 NOTE — Telephone Encounter (Signed)
Change to fenofibrate and simva to lovastatin 20mg  daily (at [facility], $4/30days) erx pended to be sent to [facility] closest to pt thanks

## 2014-10-30 ENCOUNTER — Other Ambulatory Visit: Payer: Self-pay

## 2014-10-30 MED ORDER — TRIAMTERENE-HCTZ 37.5-25 MG PO CAPS: 1.0000 | ORAL_CAPSULE | Freq: Every day | ORAL | Status: DC

## 2014-10-30 MED ORDER — LOVASTATIN 20 MG PO TABS: 20.0000 mg | ORAL_TABLET | Freq: Every day | ORAL | Status: DC

## 2014-11-02 ENCOUNTER — Other Ambulatory Visit: Payer: Self-pay

## 2014-11-02 MED ORDER — CLONAZEPAM 1 MG PO TABS: 1.0000 mg | ORAL_TABLET | Freq: Two times a day (BID) | ORAL | Status: DC | PRN

## 2014-11-03 ENCOUNTER — Telehealth: Payer: Self-pay | Admitting: Internal Medicine

## 2014-11-03 NOTE — Telephone Encounter (Signed)
Pt calling to check on Simvastatin, does she continue taking it along with her recent changes in med? Pt is confused and wants more clarification on her recent meds. [phone]

## 2014-11-05 NOTE — Telephone Encounter (Signed)
LVM for pt to call.    Simvastatin was discontinued.

## 2015-01-11 ENCOUNTER — Other Ambulatory Visit: Payer: Self-pay | Admitting: Endocrinology

## 2015-01-12 ENCOUNTER — Other Ambulatory Visit: Payer: Self-pay | Admitting: *Deleted

## 2015-01-12 ENCOUNTER — Telehealth: Payer: Self-pay | Admitting: Endocrinology

## 2015-01-12 MED ORDER — GLIMEPIRIDE 4 MG PO TABS: 4.0000 mg | ORAL_TABLET | Freq: Every day | ORAL | Status: DC

## 2015-01-12 NOTE — Telephone Encounter (Signed)
Patient called and would like her Rx sent to her pharmacy  It was sent to the wrong pharmacy in error   Rx: Glimepiride  Pharmacy: [facility] [phone]  Thank you

## 2015-01-13 ENCOUNTER — Telehealth: Payer: Self-pay | Admitting: Internal Medicine

## 2015-01-13 NOTE — Telephone Encounter (Signed)
Patient ask if you could call her Glimepiride to [facility] [phone]

## 2015-01-13 NOTE — Telephone Encounter (Signed)
Patient also would like a referral to Dr. Ron Parker with Cardiology

## 2015-01-13 NOTE — Telephone Encounter (Signed)
Patient is establishing with [name] next month.  If she needs to wait on the referral for Dr. Ron Parker that will be ok.

## 2015-01-13 NOTE — Telephone Encounter (Signed)
Would like a referral to Dr. Tamala Julian

## 2015-01-13 NOTE — Telephone Encounter (Signed)
Pharmacy advised to dispense medication.

## 2015-01-14 NOTE — Telephone Encounter (Signed)
Ok with me for refer to [place] Would need to know dx/"why" referral needed/requested to place order. thanks

## 2015-01-15 ENCOUNTER — Telehealth: Payer: Self-pay | Admitting: Internal Medicine

## 2015-01-15 DIAGNOSIS — M25562 Pain in left knee: Secondary | ICD-10-CM

## 2015-01-15 NOTE — Telephone Encounter (Signed)
Pt decided that she does not want to do right now.

## 2015-01-15 NOTE — Telephone Encounter (Signed)
Patient needs referral to Dr. Tamala Julian.  Patient states everything is in chart.

## 2015-01-15 NOTE — Telephone Encounter (Signed)
Patient returning your call.

## 2015-01-15 NOTE — Telephone Encounter (Signed)
Dx for cardiology: chest pain.

## 2015-01-16 NOTE — Telephone Encounter (Signed)
The only issue I see is left side leg pain 09/2014 - will refer to sports med for eval of same

## 2015-01-19 ENCOUNTER — Ambulatory Visit: Payer: 59 | Admitting: Family

## 2015-01-25 ENCOUNTER — Other Ambulatory Visit: Payer: Self-pay

## 2015-01-26 ENCOUNTER — Encounter: Payer: Self-pay | Admitting: Family Medicine

## 2015-01-26 ENCOUNTER — Other Ambulatory Visit (INDEPENDENT_AMBULATORY_CARE_PROVIDER_SITE_OTHER): Payer: 59

## 2015-01-26 ENCOUNTER — Ambulatory Visit (INDEPENDENT_AMBULATORY_CARE_PROVIDER_SITE_OTHER): Payer: 59 | Admitting: Family Medicine

## 2015-01-26 VITALS — BP 132/80 | HR 104 | Wt 242.0 lb

## 2015-01-26 DIAGNOSIS — M216X1 Other acquired deformities of right foot: Secondary | ICD-10-CM

## 2015-01-26 DIAGNOSIS — M25562 Pain in left knee: Secondary | ICD-10-CM

## 2015-01-26 DIAGNOSIS — M216X2 Other acquired deformities of left foot: Secondary | ICD-10-CM

## 2015-01-26 DIAGNOSIS — M129 Arthropathy, unspecified: Secondary | ICD-10-CM

## 2015-01-26 DIAGNOSIS — M1712 Unilateral primary osteoarthritis, left knee: Secondary | ICD-10-CM | POA: Insufficient documentation

## 2015-01-26 DIAGNOSIS — IMO0002 Reserved for concepts with insufficient information to code with codable children: Secondary | ICD-10-CM

## 2015-01-26 NOTE — Progress Notes (Signed)
Pre visit review using our clinic review tool, if applicable. No additional management support is needed unless otherwise documented below in the visit note. 

## 2015-01-26 NOTE — Progress Notes (Signed)
[facility] [address], [place] [address] Phone: [phone] Subjective:    I'm seeing this patient by the request  of:  Gwendolyn Grant, MD   CC: left knee pain  [id number]:[id number] Destiny Tucker is a 59 y.o. female coming in with complaint of  Left knee pain. Patient states that the pain has been more of a dull throbbing aching sensation that seems to be mostly on the lateral aspect of the knee. Has noticed it more since she's gained a lot of weight. Patient does not rib or any true injury. Patient has had x-rays before and these were reviewed by me. Patient's only has some mild arthritis as well as flabella noted. Patient has not tried any home modalities except for a brace that does seem to be helpful as long she wears it in the right place she states. Patient denies any radiation down the leg but sometimes it makes her feel like her knee is unstable. Patient denies any falls are secondary to this. Patient describes it as a dull aching sensation and does have a sharp pain with certain movements. Patient states going up and down stairs can be difficult.    Past medical history, social, surgical and family history all reviewed in electronic medical record.   Review of Systems: No headache, visual changes, nausea, vomiting, diarrhea, constipation, dizziness, abdominal pain, skin rash, fevers, chills, night sweats, weight loss, swollen lymph nodes, body aches, joint swelling, muscle aches, chest pain, shortness of breath, mood changes.   Objective Blood pressure 132/80, pulse 104, weight 242 lb (109.77 kg), SpO2 94 %.  General: No apparent distress alert and oriented x3 mood and affect normal, dressed appropriately. obese HEENT: Pupils equal, extraocular movements intact  Respiratory: Patient's speak in full sentences and does not appear short of breath  Cardiovascular: No lower extremity edema, non tender, no erythema  Skin: Warm dry intact with no  signs of infection or rash on extremities or on axial skeleton.  Abdomen: Soft nontender  Neuro: Cranial nerves II through XII are intact, neurovascularly intact in all extremities with 2+ DTRs and 2+ pulses.  Lymph: No lymphadenopathy of posterior or anterior cervical chain or axillae bilaterally.  Gait normal with good balance and coordination.  MSK:  Non tender with full range of motion and good stability and symmetric strength and tone of shoulders, elbows, wrist, hip, and ankles bilaterally.  Knee:left Effusion noted Diffusely tender overall. ROM full in flexion and extension and lower leg rotation. Ligaments with solid consistent endpoints including ACL, PCL, LCL, MCL. positiveMcmurray's, Apley's, and Thessalonian tests. positivepainful patellar compression. Patellar glide with mildcrepitus. Patellar and quadriceps tendons unremarkable. Hamstring and quadriceps strength is normal.  Contralateral knee also has some diffusely tenderness  Foot exam shows significant over pronation of the hindfoot bilaterally  MSK US performed FB:PZWC This study was ordered, performed, and interpreted by Charlann Boxer D.O.  Knee: All structures visualized. Patient's meniscus bilaterally seems to be unremarkable with no significant displacement but patient does have moderate osteophytic changes. In addition of this patient does have a positive suprapatellar effusion Patellar Tendon unremarkable on long and transverse views without effusion. No abnormality of prepatellar bursa. LCL and MCL unremarkable on long and transverse views. No abnormality of origin of medial or lateral head of the gastrocnemius.  IMPRESSION: moderate arthritis with effusion  Procedure: Real-time Ultrasound Guided Injection of left knee Device: GE Logiq E  Ultrasound guided injection is preferred based studies that show increased  duration, increased effect, greater accuracy, decreased procedural pain, increased response rate,  and decreased cost with ultrasound guided versus blind injection.  Verbal informed consent obtained.  Time-out conducted.  Noted no overlying erythema, induration, or other signs of local infection.  Skin prepped in a sterile fashion.  Local anesthesia: Topical Ethyl chloride.  With sterile technique and under real time ultrasound guidance: With a 22-gauge 2 inch needle patient was injected with 4 cc of 0.5% Marcaine and 1 cc of Kenalog 40 mg/dL. This was from a superior lateral approach.  Completed without difficulty  Pain immediately resolved suggesting accurate placement of the medication.  Advised to call if fevers/chills, erythema, induration, drainage, or persistent bleeding.  Images permanently stored and available for review in the ultrasound unit.  Impression: Technically successful ultrasound guided injection.   Impression and Recommendations:     This case required medical decision making of moderate complexity.

## 2015-01-26 NOTE — Assessment & Plan Note (Signed)
Patient was given an injection today. We discussed icing protocol and home exercises. We discussed bracing which patient did have and good patellofemoral brace. I do feel that if with patients effusion patient continues to have difficulty we may need to consider advanced imaging. I do not feel any Sipe of instability though today. I do think the patient's recent weight gain is also contributing. Patient will try the conservative approach and then come back and see me again in 3-4 weeks. Patient could be a candidate for viscous supplementation as well.

## 2015-01-26 NOTE — Patient Instructions (Addendum)
Good to see you.  Ice 20 minutes 2 times daily. Usually after activity and before bed. Exercises 3 times a week.  Try pennsaid twice daily Take tylenol 650 mg three times a day is the best evidence based medicine we have for arthritis.  Glucosamine sulfate 750mg  twice a day is a supplement that has been shown to help moderate to severe arthritis. Vitamin D 2000 IU daily Fish oil 2 grams daily.  Tumeric 500mg  twice daily.  Capsaicin topically up to four times a day may also help with pain. If cortisone injections do not help, there are different types of shots that may help but they take longer to take effect.  We can discuss this at follow up.  It's important that you continue to stay active. Controlling your weight is important.  Consider custom orthotics  Water aerobics and cycling with low resistance are the best two types of exercise for arthritis. Come back and see me in 3-4 weeks.

## 2015-01-28 ENCOUNTER — Ambulatory Visit: Payer: Self-pay | Admitting: Endocrinology

## 2015-02-15 ENCOUNTER — Telehealth: Payer: Self-pay | Admitting: Endocrinology

## 2015-02-15 NOTE — Telephone Encounter (Signed)
Please advise 

## 2015-02-15 NOTE — Telephone Encounter (Signed)
Not yet

## 2015-02-15 NOTE — Telephone Encounter (Signed)
Patient would like to know is her patient assistance form filled out. Please advise

## 2015-02-16 ENCOUNTER — Other Ambulatory Visit: Payer: Self-pay | Admitting: *Deleted

## 2015-02-16 MED ORDER — INSULIN LISPRO 100 UNIT/ML CARTRIDGE: 4.0000 [IU] | Freq: Three times a day (TID) | SUBCUTANEOUS | Status: DC

## 2015-02-17 NOTE — Telephone Encounter (Signed)
Patient called again to see if paperwork is done for her patient assistance for insulin.  She is requesting pens due to arthritis in hands

## 2015-02-17 NOTE — Telephone Encounter (Signed)
Paperwork was sent back

## 2015-02-22 ENCOUNTER — Telehealth: Payer: Self-pay | Admitting: Endocrinology

## 2015-02-22 NOTE — Telephone Encounter (Signed)
Please see below and advise.

## 2015-02-22 NOTE — Telephone Encounter (Signed)
Pt called and needs to send something on letter head stating she has arthritis in right hand and need flex pen levnair

## 2015-02-23 ENCOUNTER — Encounter: Payer: Self-pay | Admitting: *Deleted

## 2015-02-23 NOTE — Telephone Encounter (Signed)
Okay to put that in a letter

## 2015-02-24 ENCOUNTER — Telehealth: Payer: Self-pay | Admitting: Endocrinology

## 2015-02-24 NOTE — Telephone Encounter (Signed)
Please do ASAP:  RX needs to be resubmitted for Humalog Kwikpen please be sure to be sent to [employer] also under pt assistance program.

## 2015-02-25 ENCOUNTER — Telehealth: Payer: Self-pay | Admitting: Endocrinology

## 2015-02-25 MED ORDER — INSULIN LISPRO 100 UNIT/ML CARTRIDGE: 4.0000 [IU] | Freq: Three times a day (TID) | SUBCUTANEOUS | Status: DC

## 2015-02-25 NOTE — Addendum Note (Signed)
Addended by: Moody Bruins E on: 02/25/2015 01:24 PM   Modules accepted: Orders

## 2015-02-25 NOTE — Telephone Encounter (Signed)
We need to call [facility] and ask them for a return label so we can return the vials of the levemir [phone]. Once this is returned to them they will send [place] another shipment of the levemir kwik pens only

## 2015-02-25 NOTE — Telephone Encounter (Addendum)
Rx printed waiting on MD to sign, fax number for [employer] attached to RX.

## 2015-02-25 NOTE — Telephone Encounter (Signed)
I contacted [facility]. They informed me the pt's shipment of Levemir should be shipping soon. Charles, the representative I spoke with stated they had all the information needed from our office. Pt has two refill remaning for the year of 2016.

## 2015-03-09 ENCOUNTER — Telehealth: Payer: Self-pay | Admitting: Endocrinology

## 2015-03-09 ENCOUNTER — Ambulatory Visit (INDEPENDENT_AMBULATORY_CARE_PROVIDER_SITE_OTHER): Payer: 59 | Admitting: Family

## 2015-03-09 ENCOUNTER — Encounter: Payer: Self-pay | Admitting: Family

## 2015-03-09 VITALS — BP 128/96 | HR 100 | Temp 97.6°F | Resp 18 | Ht 64.0 in | Wt 244.0 lb

## 2015-03-09 DIAGNOSIS — R600 Localized edema: Secondary | ICD-10-CM | POA: Diagnosis not present

## 2015-03-09 DIAGNOSIS — S7001XA Contusion of right hip, initial encounter: Secondary | ICD-10-CM | POA: Diagnosis not present

## 2015-03-09 DIAGNOSIS — M6248 Contracture of muscle, other site: Secondary | ICD-10-CM | POA: Diagnosis not present

## 2015-03-09 DIAGNOSIS — M62838 Other muscle spasm: Secondary | ICD-10-CM

## 2015-03-09 NOTE — Telephone Encounter (Signed)
Patient would like to know if Dr Dwyane Dee could check kidney function at her lab appt please advise
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
## 2015-03-09 NOTE — Assessment & Plan Note (Signed)
Mild lower extremity edema present. Obtain complete metabolic panel to check kidney and liver function. Patient indicated she has an enlarged heart. Cannot rule out cardiovascular origin of lower extremity edema. Continue current Dyazide. Follow-up pending lab work and may consider addition of 2-D echocardiogram.

## 2015-03-09 NOTE — Assessment & Plan Note (Signed)
Symptoms and exam consistent with contusion of the right hip. No indications for x-ray noted at this time. Continue conservative treatment with nonsteroidal anti-inflammatories as needed, heat 2-3 times per day and stretching. If symptoms worsen or fail to improve may consider adding physical therapy.

## 2015-03-09 NOTE — Patient Instructions (Signed)
Thank you for choosing [facility].  Summary/Instructions:  For your neck and hip - heat as needed for soreness 2-3 times per day. May take anti-inflammatories as needed for for soreness.   Please stop by the lab on the basement level of the building for your blood work. Your results will be released to [facility] (or called to you) after review, usually within 72 hours after test completion. If any changes need to be made, you will be notified at that same time.  If your symptoms worsen or fail to improve, please contact our office for further instruction, or in case of emergency go directly to the emergency room at the closest medical facility.    Peripheral Edema You have swelling in your legs (peripheral edema). This swelling is due to excess accumulation of salt and water in your body. Edema may be a sign of heart, kidney or liver disease, or a side effect of a medication. It may also be due to problems in the leg veins. Elevating your legs and using special support stockings may be very helpful, if the cause of the swelling is due to poor venous circulation. Avoid long periods of standing, whatever the cause. Treatment of edema depends on identifying the cause. Chips, pretzels, pickles and other salty foods should be avoided. Restricting salt in your diet is almost always needed. Water pills (diuretics) are often used to remove the excess salt and water from your body via urine. These medicines prevent the kidney from reabsorbing sodium. This increases urine flow. Diuretic treatment may also result in lowering of potassium levels in your body. Potassium supplements may be needed if you have to use diuretics daily. Daily weights can help you keep track of your progress in clearing your edema. You should call your caregiver for follow up care as recommended. SEEK IMMEDIATE MEDICAL CARE IF:   You have increased swelling, pain, redness, or heat in your legs.  You develop shortness of breath,  especially when lying down.  You develop chest or abdominal pain, weakness, or fainting.  You have a fever. Document Released: 12/14/2004 Document Revised: 01/29/2012 Document Reviewed: 11/24/2009 [facility] Patient Information 2015 [place], [place]. This information is not intended to replace advice given to you by your health care provider. Make sure you discuss any questions you have with your health care provider.

## 2015-03-09 NOTE — Assessment & Plan Note (Signed)
Symptoms and exam consistent with mild muscle spasm of right upper trapezius muscle. Continue to treat conservatively at this time with heat and stretching. If symptoms worsen or fail to improve will add muscle relaxer to treatment regimen.

## 2015-03-09 NOTE — Progress Notes (Signed)
Pre visit review using our clinic review tool, if applicable. No additional management support is needed unless otherwise documented below in the visit note. 

## 2015-03-09 NOTE — Telephone Encounter (Signed)
It is already ordered.

## 2015-03-15 ENCOUNTER — Other Ambulatory Visit (INDEPENDENT_AMBULATORY_CARE_PROVIDER_SITE_OTHER): Payer: 59

## 2015-03-15 DIAGNOSIS — IMO0002 Reserved for concepts with insufficient information to code with codable children: Secondary | ICD-10-CM

## 2015-03-15 DIAGNOSIS — R600 Localized edema: Secondary | ICD-10-CM | POA: Diagnosis not present

## 2015-03-15 DIAGNOSIS — E1165 Type 2 diabetes mellitus with hyperglycemia: Secondary | ICD-10-CM

## 2015-03-15 LAB — COMPREHENSIVE METABOLIC PANEL
ALBUMIN: 4 g/dL (ref 3.5–5.2)
ALK PHOS: 90 U/L (ref 39–117)
ALT: 24 U/L (ref 0–35)
AST: 18 U/L (ref 0–37)
BILIRUBIN TOTAL: 0.3 mg/dL (ref 0.2–1.2)
BUN: 14 mg/dL (ref 6–23)
CO2: 25 mEq/L (ref 19–32)
Calcium: 9.4 mg/dL (ref 8.4–10.5)
Chloride: 100 mEq/L (ref 96–112)
Creatinine, Ser: 0.53 mg/dL (ref 0.40–1.20)
GFR: 124.99 mL/min (ref >60.00)
Glucose, Bld: 241 mg/dL — ABNORMAL HIGH (ref 70–99)
POTASSIUM: 3.8 meq/L (ref 3.5–5.1)
Sodium: 133 mEq/L — ABNORMAL LOW (ref 135–145)
Total Protein: 7.3 g/dL (ref 6.0–8.3)

## 2015-03-15 LAB — HEMOGLOBIN A1C: Hgb A1c MFr Bld: 6.9 % — ABNORMAL HIGH (ref 4.6–6.5)

## 2015-03-16 ENCOUNTER — Encounter: Payer: Self-pay | Admitting: Family

## 2015-03-18 ENCOUNTER — Ambulatory Visit (INDEPENDENT_AMBULATORY_CARE_PROVIDER_SITE_OTHER): Payer: 59 | Admitting: Endocrinology

## 2015-03-18 ENCOUNTER — Encounter: Payer: Self-pay | Admitting: Endocrinology

## 2015-03-18 VITALS — BP 122/86 | HR 102 | Temp 97.8°F | Wt 244.0 lb

## 2015-03-18 DIAGNOSIS — E785 Hyperlipidemia, unspecified: Secondary | ICD-10-CM | POA: Diagnosis not present

## 2015-03-18 DIAGNOSIS — IMO0002 Reserved for concepts with insufficient information to code with codable children: Secondary | ICD-10-CM

## 2015-03-18 DIAGNOSIS — E1165 Type 2 diabetes mellitus with hyperglycemia: Secondary | ICD-10-CM | POA: Diagnosis not present

## 2015-03-18 NOTE — Patient Instructions (Signed)
Levemir in am to 22 and go up to 40 at night May go up on bedtime dose 2-4 units if sugar still >150   Call re: changing to [place] before refilling  Please check blood sugars at least half the time about 2 hours after any meal and 3 times per week on waking up.  Please bring blood sugar monitor to each visit.  Recommended blood sugar levels about 2 hours after meal is 140-180 and on waking up 90-130  Adjust Humalog if sugar 2 hr after is out of range

## 2015-03-18 NOTE — Progress Notes (Signed)
Patient ID: Destiny Tucker, female   DOB: 1956/07/03, 59 y.o.   MRN: [id number]   Reason for Appointment: Diabetes follow-up   History of Present Illness   Diagnosis: Type 2 DIABETES MELITUS, date of diagnosis:  08/2010     Previous history: She has been on various regimens for her diabetes including [place] in the past. Because of progressively higher fasting readings she was started on Levemir insulin in 2013 She usually tends to have relatively high fasting readings  Recent history:  Although her A1c is fairly good at 6.9 she appears to have persistently high fasting blood sugars She has gradually increase the dose of Levemir insulin because of relatively high fasting readings Since her last visit she has also increased her morning Levemir insulin Current blood sugar patterns and problems:  Despite reminders she is not checking her readings after meals much and primarily doing it with her first meal which is late morning  She has not increased her Levemir recently despite consistently high readings in the mornings  With increasing her morning Levemir recently she has had occasional low blood sugars in the late afternoon especially if she is late for eating  Previously in January she had a couple of readings after supper and had only occasional high readings  Lab glucose after breakfast was mildly increased  She is still is gradually gaining weight despite taking Victoza and may not be consistent with diet She says that she has been compliant with all her medications and Victoza     Oral hypoglycemic drugs: Amaryl hs, Metformin 1g hs      Side effects from medications: None Insulin regimen:  Humalog 14-16  before meals, Levemir 26 in am and 36 units at night            Monitors blood glucose:  1-2 times a day.    Glucometer:         Blood Glucose readings from meter:  PRE-MEAL Breakfast Lunch Dinner Bedtime Overall  Glucose range: 161-196  119 145, 208     Mean/median:  175      171    POST-MEAL PC Breakfast PC Lunch PC Dinner  Glucose range:   119-211  Mean/median:        Hypoglycemia:  rare, at 6 pm if late for her evening meal        Meals: 3 meals per day: Breakfast at Noon; lunch 7 pm; snacks at times, occasional ice cream       Physical activity:  working outside, no consistent exercise           Dietician visit: Most recent: At diagnosis and with nurse educator periodically      Weight control:  Wt Readings from Last 3 Encounters:  03/18/15 244 lb (110.678 kg)  03/09/15 244 lb (110.678 kg)  01/26/15 242 lb (109.77 kg)         Diabetes labs:  Lab Results  Component Value Date   HGBA1C 6.9* 03/15/2015   HGBA1C 7.1* 10/23/2014   HGBA1C 6.7* 06/16/2014   Lab Results  Component Value Date   MICROALBUR 0.3 10/01/2013   LDLCALC 46 06/16/2014   CREATININE 0.53 03/15/2015      Medication List       This list is accurate as of: 03/18/15  8:40 PM.  Always use your most recent med list.               ASPIR-LOW 81 MG EC tablet  Generic drug:  aspirin  TAKE 1 TABLET BY MOUTH ONCE DAILY     B-D SINGLE USE SWABS REGULAR Pads  USE AS DIRECTED TWICE DAILY     clonazePAM 1 MG tablet  Commonly known as:  KLONOPIN  Take 1 tablet (1 mg total) by mouth 2 (two) times daily as needed for anxiety.     glimepiride 4 MG tablet  Commonly known as:  AMARYL  Take 1 tablet (4 mg total) by mouth daily with breakfast.     glucose blood test strip  Use to test up to 3 times daily     Insulin Detemir 100 UNIT/ML Pen  Commonly known as:  LEVEMIR  Inject 20-36 Units into the skin 2 (two) times daily. Takes 20 in am and 36 units at night     insulin lispro 100 UNIT/ML cartridge  Commonly known as:  HUMALOG  Inject 0.04-0.08 mLs (4-8 Units total) into the skin 3 (three) times daily with meals. Patient tries to gage dose depending on carbs     Insulin Syringe-Needle U-100 31G X 5/16" 0.3 ML Misc  Commonly known as:  B-D INSULIN  SYRINGE  Use 2 syringes daily with Levemir Insulin     lovastatin 20 MG tablet  Commonly known as:  MEVACOR  Take 1 tablet (20 mg total) by mouth at bedtime.     metFORMIN 1000 MG tablet  Commonly known as:  GLUCOPHAGE  Take 1 tablet (1,000 mg total) by mouth 2 (two) times daily with a meal.     naproxen 500 MG tablet  Commonly known as:  NAPROSYN  Take 1 tablet (500 mg total) by mouth 2 (two) times daily with a meal.     NOVOFINE 32G X 6 MM Misc  Generic drug:  Insulin Pen Needle  USE 3 TIMES A DAY     ONETOUCH DELICA LANCETS Misc  Use up to three times a day     ONETOUCH DELICA LANCETS 03J Misc  USE TO TEST UP TO 3 TIMES DAILY     sertraline 100 MG tablet  Commonly known as:  ZOLOFT  TAKE 1 TABLET BY MOUTH ONCE DAILY     triamterene-hydrochlorothiazide 37.5-25 MG per capsule  Commonly known as:  DYAZIDE  Take 1 each (1 capsule total) by mouth daily.     VICTOZA 18 MG/3ML Sopn  Generic drug:  Liraglutide  Inject 1.2 Units into the skin daily. Plus 3 clicks        Allergies:  Allergies  Allergen Reactions  . Codeine     Past Medical History  Diagnosis Date  . OSTEOPENIA   . TOBACCO USE, QUIT   . HYPERTENSION   . HYPERLIPIDEMIA   . DEPRESSION   . ANXIETY   . ALLERGIC RHINITIS   . Diabetes mellitus   . Internal hemorrhoids without mention of complication 00/[id number]    Colonoscopy--Dr. Fuller Plan , pts. states hemorrhoids are uncomfortable    Past Surgical History  Procedure Laterality Date  . Unilateral salpingo-oophorectomy    . Mohs surgery      Precancerous skin lesion  . Removal of fallopion tube & ovary  1986  . Removal of cyst & other ovary  09/2004  . Anus surgery      Family History  Problem Relation Age of Onset  . Hypertension Mother   . Hyperlipidemia Mother   . Nephrolithiasis Mother   . Hypertension Father   . Hyperlipidemia Father   . Nephrolithiasis Father   . Diabetes Maternal Uncle   .  Diabetes Mother     Social History:   reports that she quit smoking about 5 years ago. She does not have any smokeless tobacco history on file. She reports that she drinks alcohol. She reports that she does not use illicit drugs.  Review of Systems:  Hypertension:  mild and treated with only Dyazide qd   Lipids: She is only on 20 mg of lovastatin with good control. Triglycerides under 200, not taking fenofibrate because of cost  Lab Results  Component Value Date   CHOL 114 06/16/2014   HDL 31.30* 06/16/2014   LDLCALC 46 06/16/2014   LDLDIRECT 68.7 10/01/2013   TRIG 184.0* 06/16/2014   CHOLHDL 4 06/16/2014    Knee pain at times still present.  She thinks she may be depressed at times.  Still taking Zoloft       LABS:  Lab on 03/15/2015  Component Date Value Ref Range Status  . Hgb A1c MFr Bld 03/15/2015 6.9* 4.6 - 6.5 % Final   Glycemic Control Guidelines for People with Diabetes:Non Diabetic:  <6%Goal of Therapy: <7%Additional Action Suggested:  >8%   . Sodium 03/15/2015 133* 135 - 145 mEq/L Final  . Potassium 03/15/2015 3.8  3.5 - 5.1 mEq/L Final  . Chloride 03/15/2015 100  96 - 112 mEq/L Final  . CO2 03/15/2015 25  19 - 32 mEq/L Final  . Glucose, Bld 03/15/2015 241* 70 - 99 mg/dL Final  . BUN 03/15/2015 14  6 - 23 mg/dL Final  . Creatinine, Ser 03/15/2015 0.53  0.40 - 1.20 mg/dL Final  . Total Bilirubin 03/15/2015 0.3  0.2 - 1.2 mg/dL Final  . Alkaline Phosphatase 03/15/2015 90  39 - 117 U/L Final  . AST 03/15/2015 18  0 - 37 U/L Final  . ALT 03/15/2015 24  0 - 35 U/L Final  . Total Protein 03/15/2015 7.3  6.0 - 8.3 g/dL Final  . Albumin 03/15/2015 4.0  3.5 - 5.2 g/dL Final  . Calcium 03/15/2015 9.4  8.4 - 10.5 mg/dL Final  . GFR 03/15/2015 124.99  >60.00 mL/min Final     Examination:   BP 122/86 mmHg  Pulse 102  Temp(Src) 97.8 F (36.6 C) (Oral)  Wt 244 lb (110.678 kg)  SpO2 95%  Body mass index is 41.86 kg/(m^2).   No ankle edema  ASSESSMENT/ PLAN:   Diabetes type 2   Blood glucose  control is not as good with fasting readings consistently high However as discussed in history of present illness she is not checking her readings after meals or in the evenings Previously her fasting readings have been generally higher than in the afternoons She is somewhat limited in her ability to try new medications because of cost  Discussed that she needs to increase her Levemir at night to bring morning sugars down and also reduce morning Levemir if she is tending to have low sugars in the afternoons with being late for meals Also given the option of switching to [place] for once a day convenience on the next prescription and she can get this on the indigent program also Meanwhile will start having her monitor her sugars after meals more consistently to help adjust her mealtime dose She also needs to have consistent exercise program She can benefit from higher doses of Victoza but has not tolerated 1.8 mg previously    Patient Instructions  Levemir in am to 22 and go up to 40 at night May go up on bedtime dose 2-4 units if sugar  still >150   Call re: changing to [place] before refilling  Please check blood sugars at least half the time about 2 hours after any meal and 3 times per week on waking up.  Please bring blood sugar monitor to each visit.  Recommended blood sugar levels about 2 hours after meal is 140-180 and on waking up 90-130  Adjust Humalog if sugar 2 hr after is out of range     Payslie Mccaig 03/18/2015, 8:40 PM

## 2015-04-06 ENCOUNTER — Other Ambulatory Visit: Payer: Self-pay | Admitting: *Deleted

## 2015-04-06 ENCOUNTER — Telehealth: Payer: Self-pay | Admitting: Endocrinology

## 2015-04-06 MED ORDER — METFORMIN HCL 1000 MG PO TABS: 1000.0000 mg | ORAL_TABLET | Freq: Two times a day (BID) | ORAL | Status: DC

## 2015-04-06 NOTE — Telephone Encounter (Signed)
Pt called needs refill on her metformin  (180)  Call into [facility] on [address]

## 2015-04-06 NOTE — Telephone Encounter (Signed)
rx sent

## 2015-04-07 ENCOUNTER — Telehealth: Payer: Self-pay | Admitting: Internal Medicine

## 2015-04-07 NOTE — Telephone Encounter (Signed)
Pt request referral for Dr. Delman Cheadle optamologist for diabetes check up. Pt never see this doctor before and she has [facility] compass. Please help, pt has an appt on 04/27/15.

## 2015-04-13 NOTE — Telephone Encounter (Signed)
[employer] ref # [id number] valid 04/13/15-10/14/15 for 6 visits. Faxed to [facility].

## 2015-04-15 ENCOUNTER — Telehealth: Payer: Self-pay | Admitting: Internal Medicine

## 2015-04-15 NOTE — Telephone Encounter (Signed)
If she is still taking the same doses as suggested at last visit with Dr Dwyane Dee:  Please increase Humalog: Humalog 14-16 before meals, except 18-20 before dinner Continue: Levemir 22 in am and 40 units at night

## 2015-04-15 NOTE — Telephone Encounter (Signed)
FYI pt wanted to let you know that since the increase in insulin at night her AM readings are still high over the past 14 days her readings average 178 with low of 165 and high of about 200

## 2015-04-15 NOTE — Telephone Encounter (Signed)
She can increase her Levemir to 44 units at bedtime but  needs to know what her blood sugars are after supper. Will need to consider her starting Invokana to help her sugars, may be able to get this free with a co-pay card

## 2015-04-15 NOTE — Telephone Encounter (Signed)
Patient was informed,   Patient said when she has to much starch/sugar she has these bumpy things (Hives)? on her upper thigh and under her breast and under arms.  Please advise.

## 2015-04-15 NOTE — Telephone Encounter (Signed)
Please read message below and advise.  

## 2015-04-16 NOTE — Telephone Encounter (Signed)
She wants to wait to see if the increased dose of Levemir works.

## 2015-04-16 NOTE — Telephone Encounter (Signed)
She needs to see her PCP for this.  Also needs to make a follow-up appointment with me in about 2 weeks.  Does she want to try Invokana?

## 2015-04-28 ENCOUNTER — Encounter: Payer: Self-pay | Admitting: Family

## 2015-04-28 ENCOUNTER — Ambulatory Visit (INDEPENDENT_AMBULATORY_CARE_PROVIDER_SITE_OTHER): Payer: 59 | Admitting: Family

## 2015-04-28 VITALS — BP 118/78 | HR 100 | Temp 98.0°F | Wt 245.5 lb

## 2015-04-28 DIAGNOSIS — R220 Localized swelling, mass and lump, head: Secondary | ICD-10-CM | POA: Diagnosis not present

## 2015-04-28 MED ORDER — AMOXICILLIN-POT CLAVULANATE 875-125 MG PO TABS: 1.0000 | ORAL_TABLET | Freq: Two times a day (BID) | ORAL | Status: DC

## 2015-04-28 MED ORDER — PREDNISONE 10 MG PO TABS: ORAL_TABLET | ORAL | Status: DC

## 2015-04-28 NOTE — Patient Instructions (Addendum)
Thank you for choosing [facility].  Summary/Instructions:  Please follow up with Dentistry.   Your prescription(s) have been submitted to your pharmacy or been printed and provided for you. Please take as directed and contact our office if you believe you are having problem(s) with the medication(s) or have any questions.  If your symptoms worsen or fail to improve, please contact our office for further instruction, or in case of emergency go directly to the emergency room at the closest medical facility.    6 Day Prednisone Taper Instructions:   Day 1: Two tablets before breakfast, one after lunch, one after dinner, and two at bedtime.  Day 2: One tablet before breakfast, one after lunch, one after dinner, and two at bedtime Day 3: One tablet before breakfast, one after lunch, one after dinner, and one at bedtime Day 4: One tablet before breakfast, one after lunch, and one at bedtime Day 5: One tablet before breakfast and one at bedtime Day 6: One tablet before breakfast

## 2015-04-28 NOTE — Progress Notes (Signed)
Subjective:    Patient ID: Destiny Tucker, female    DOB: 1956/09/24, 59 y.o.   MRN: [id number]  Chief Complaint  Patient presents with  . Sinus Problem    patient is not sure if sinuses have drained and caused infection around her eye and jaw--or if she has had an allergic reaction---root canal hx 6 mo ago, also patient worked in yard 2 days ago but doesn remember insect bite or bee sting occuring---blood coming from jaw line or around tooth, patient not sure    HPI:  Destiny Tucker is a 59 y.o. female with a PMH of hyperlipidemia, anxiety, depression, hypertension, osteopenia, and Type 2 diabetes who presents today for an acute office visit.   Associated symptom of swelling located on the right sided of her face has been going on for about 1 day. She woke up this morning and the right side of her face had increased in swelling from yesterder. Modifying factors include Benadryl which has not helped very much. She notes she recently had a root canal and treated for a fissure that needed to be drained and treated with Amoxicillin. Since having the root canal she continues to experience episodes of swelling. It was recommended that she be seen by an oral surgeon. Notes this morning she had discharge of pus and blood. She was working out in the yard but does not recall being bitten or stung by an insect.   Allergies  Allergen Reactions  . Codeine     Current Outpatient Prescriptions on File Prior to Visit  Medication Sig Dispense Refill  . Alcohol Swabs (B-D SINGLE USE SWABS REGULAR) PADS USE AS DIRECTED TWICE DAILY 100 each 9  . ASPIR-LOW 81 MG EC tablet TAKE 1 TABLET BY MOUTH ONCE DAILY 90 tablet 3  . clonazePAM (KLONOPIN) 1 MG tablet Take 1 tablet (1 mg total) by mouth 2 (two) times daily as needed for anxiety. 180 tablet 1  . glimepiride (AMARYL) 4 MG tablet Take 1 tablet (4 mg total) by mouth daily with breakfast. 90 tablet 0  . glucose blood test strip Use to test up to 3 times  daily 300 each 1  . Insulin Detemir (LEVEMIR) 100 UNIT/ML Pen Inject 20-44 Units into the skin 2 (two) times daily. Takes 20 in am and 44 units at night    . insulin lispro (HUMALOG) 100 UNIT/ML cartridge Inject 0.04-0.08 mLs (4-8 Units total) into the skin 3 (three) times daily with meals. Patient tries to gage dose depending on carbs (Patient taking differently: Inject 12-16 Units into the skin 3 (three) times daily with meals. Patient tries to gage dose depending on carbs) 15 mL 3  . Insulin Syringe-Needle U-100 (B-D INSULIN SYRINGE) 31G X 5/16" 0.3 ML MISC Use 2 syringes daily with Levemir Insulin 100 each 3  . Liraglutide (VICTOZA) 18 MG/3ML SOPN Inject 1.2 Units into the skin daily. Plus 3 clicks    . lovastatin (MEVACOR) 20 MG tablet Take 1 tablet (20 mg total) by mouth at bedtime. 90 tablet 3  . metFORMIN (GLUCOPHAGE) 1000 MG tablet Take 1 tablet (1,000 mg total) by mouth 2 (two) times daily with a meal. 180 tablet 1  . naproxen (NAPROSYN) 500 MG tablet Take 1 tablet (500 mg total) by mouth 2 (two) times daily with a meal. 30 tablet 0  . NOVOFINE 32G X 6 MM MISC USE 3 TIMES A DAY 300 each 1  . ONETOUCH DELICA LANCETS 72Z MISC USE TO TEST UP  TO 3 TIMES DAILY 300 each 1  . ONETOUCH DELICA LANCETS MISC Use up to three times a day 300 each 1  . sertraline (ZOLOFT) 100 MG tablet TAKE 1 TABLET BY MOUTH ONCE DAILY 90 tablet 3  . triamterene-hydrochlorothiazide (DYAZIDE) 37.5-25 MG per capsule Take 1 each (1 capsule total) by mouth daily. 90 capsule 3   No current facility-administered medications on file prior to visit.    Review of Systems  Constitutional: Negative for fever and chills.  HENT: Positive for dental problem and facial swelling.       Objective:    BP 118/78 mmHg  Pulse 100  Temp(Src) 98 F (36.7 C)  Wt 245 lb 8 oz (111.358 kg)  SpO2 98% Nursing note and vital signs reviewed.  Physical Exam  Constitutional: She is oriented to person, place, and time. She appears  well-developed and well-nourished. No distress.  HENT:  Mouth/Throat: Oral lesions present.  Question abscess on superior aspect of right gum next to molars.   Cardiovascular: Normal rate, regular rhythm, normal heart sounds and intact distal pulses.   Pulmonary/Chest: Effort normal and breath sounds normal.  Neurological: She is alert and oriented to person, place, and time.  Skin: Skin is warm and dry.  Psychiatric: She has a normal mood and affect. Her behavior is normal. Judgment and thought content normal.       Assessment & Plan:   Problem List Items Addressed This Visit      Other   Facial swelling - Primary    Swelling most likely the result of dental infection. Start Augmentin. Start prednisone to cover for inflammation. Patient instructed to contact her dentist for follow up at the next available appointment. Follow up if symptoms worsen or fail to improve after dentistry.       Relevant Medications   predniSONE (DELTASONE) 10 MG tablet   amoxicillin-clavulanate (AUGMENTIN) 875-125 MG per tablet

## 2015-04-28 NOTE — Assessment & Plan Note (Signed)
Swelling most likely the result of dental infection. Start Augmentin. Start prednisone to cover for inflammation. Patient instructed to contact her dentist for follow up at the next available appointment. Follow up if symptoms worsen or fail to improve after dentistry.

## 2015-04-28 NOTE — Progress Notes (Signed)
Pre visit review using our clinic review tool, if applicable. No additional management support is needed unless otherwise documented below in the visit note. 

## 2015-04-30 ENCOUNTER — Telehealth: Payer: Self-pay | Admitting: Internal Medicine

## 2015-04-30 NOTE — Telephone Encounter (Signed)
The prednisone taper should be decreasing and her blood sugars should be coming down with the dosage of prednisone. She can increase her sliding scale insulins as needed.

## 2015-04-30 NOTE — Telephone Encounter (Signed)
Pt called in and said that meds are working great but her sugar is high due to the predisone.  It was 251 at 11:30 and 2 hrs later it was 253.  She called dr Dwyane Dee office and they told her that greg should have addressed it??  She is not sure what to do?    Best number [phone]

## 2015-05-17 ENCOUNTER — Other Ambulatory Visit: Payer: Self-pay

## 2015-05-26 ENCOUNTER — Encounter: Payer: Self-pay | Admitting: Family

## 2015-05-26 ENCOUNTER — Ambulatory Visit (INDEPENDENT_AMBULATORY_CARE_PROVIDER_SITE_OTHER): Payer: 59 | Admitting: Family

## 2015-05-26 ENCOUNTER — Other Ambulatory Visit: Payer: Self-pay | Admitting: Internal Medicine

## 2015-05-26 VITALS — BP 118/78 | HR 101 | Temp 97.8°F | Resp 18 | Ht 64.0 in | Wt 244.0 lb

## 2015-05-26 DIAGNOSIS — Z021 Encounter for pre-employment examination: Secondary | ICD-10-CM | POA: Diagnosis not present

## 2015-05-26 NOTE — Assessment & Plan Note (Signed)
Medical screening performed and patient is healthy to begin work without restrictions. A TB test was placed and will be read in 48 hours. Form was received and will be completed.

## 2015-05-26 NOTE — Progress Notes (Signed)
Pre visit review using our clinic review tool, if applicable. No additional management support is needed unless otherwise documented below in the visit note. 

## 2015-05-26 NOTE — Progress Notes (Signed)
Subjective:    Patient ID: Destiny Tucker, female    DOB: 1955-12-25, 59 y.o.   MRN: [id number]  Chief Complaint  Patient presents with  . CPE    Needs form filled for job    HPI:  Destiny Tucker is a 59 y.o. female who presents today for a pre-employment physical screen.    Health Maintenance  Topic Date Due  . OPHTHALMOLOGY EXAM  01/10/1965  . HIV Screening  01/10/1970  . PAP SMEAR  01/10/1973  . URINE MICROALBUMIN  10/01/2014  . FOOT EXAM  10/03/2014  . ZOSTAVAX  01/10/2015  . MAMMOGRAM  05/25/2016 (Originally 02/14/2015)  . INFLUENZA VACCINE  06/21/2015  . HEMOGLOBIN A1C  09/14/2015  . TETANUS/TDAP  11/20/2016  . PNEUMOCOCCAL POLYSACCHARIDE VACCINE (2) 10/03/2018  . COLONOSCOPY  10/11/2022    Immunization History  Administered Date(s) Administered  . Influenza Whole 08/20/2009  . Influenza-Unspecified 09/20/2013  . Pneumococcal Polysaccharide-23 10/03/2013  . Td 11/20/2006    Allergies  Allergen Reactions  . Codeine      Outpatient Prescriptions Prior to Visit  Medication Sig Dispense Refill  . Alcohol Swabs (B-D SINGLE USE SWABS REGULAR) PADS USE AS DIRECTED TWICE DAILY 100 each 9  . ASPIR-LOW 81 MG EC tablet TAKE 1 TABLET BY MOUTH ONCE DAILY 90 tablet 3  . glimepiride (AMARYL) 4 MG tablet Take 1 tablet (4 mg total) by mouth daily with breakfast. 90 tablet 0  . glucose blood test strip Use to test up to 3 times daily 300 each 1  . Insulin Detemir (LEVEMIR) 100 UNIT/ML Pen Inject 20-44 Units into the skin 2 (two) times daily. Takes 20 in am and 44 units at night    . insulin lispro (HUMALOG) 100 UNIT/ML cartridge Inject 0.04-0.08 mLs (4-8 Units total) into the skin 3 (three) times daily with meals. Patient tries to gage dose depending on carbs (Patient taking differently: Inject 12-16 Units into the skin 3 (three) times daily with meals. Patient tries to gage dose depending on carbs) 15 mL 3  . Insulin Syringe-Needle U-100 (B-D INSULIN SYRINGE) 31G X 5/16"  0.3 ML MISC Use 2 syringes daily with Levemir Insulin 100 each 3  . Liraglutide (VICTOZA) 18 MG/3ML SOPN Inject 1.2 Units into the skin daily. Plus 3 clicks    . lovastatin (MEVACOR) 20 MG tablet Take 1 tablet (20 mg total) by mouth at bedtime. 90 tablet 3  . metFORMIN (GLUCOPHAGE) 1000 MG tablet Take 1 tablet (1,000 mg total) by mouth 2 (two) times daily with a meal. 180 tablet 1  . Multiple Vitamins-Minerals (MULTIVITAMIN WITH MINERALS) tablet Take 1 tablet by mouth daily.    . naproxen (NAPROSYN) 500 MG tablet Take 1 tablet (500 mg total) by mouth 2 (two) times daily with a meal. 30 tablet 0  . NOVOFINE 32G X 6 MM MISC USE 3 TIMES A DAY 300 each 1  . ONETOUCH DELICA LANCETS 41L MISC USE TO TEST UP TO 3 TIMES DAILY 300 each 1  . ONETOUCH DELICA LANCETS MISC Use up to three times a day 300 each 1  . predniSONE (DELTASONE) 10 MG tablet Take 6 tablets x 1 day, 5 tablets x 1 day, 4 tablets x 1 day, 3 tablets x 1 day, 2 tablets x 1, 1 tablet x 1 day 21 tablet 0  . sertraline (ZOLOFT) 100 MG tablet TAKE 1 TABLET BY MOUTH ONCE DAILY 90 tablet 3  . triamterene-hydrochlorothiazide (DYAZIDE) 37.5-25 MG per capsule Take 1 each (  1 capsule total) by mouth daily. 90 capsule 3  . amoxicillin-clavulanate (AUGMENTIN) 875-125 MG per tablet Take 1 tablet by mouth 2 (two) times daily. 20 tablet 0  . clonazePAM (KLONOPIN) 1 MG tablet Take 1 tablet (1 mg total) by mouth 2 (two) times daily as needed for anxiety. 180 tablet 1   No facility-administered medications prior to visit.     Past Medical History  Diagnosis Date  . OSTEOPENIA   . TOBACCO USE, QUIT   . HYPERTENSION   . HYPERLIPIDEMIA   . DEPRESSION   . ANXIETY   . ALLERGIC RHINITIS   . Diabetes mellitus   . Internal hemorrhoids without mention of complication 93/81/8299    Colonoscopy--Dr. Fuller Plan , pts. states hemorrhoids are uncomfortable     Past Surgical History  Procedure Laterality Date  . Unilateral salpingo-oophorectomy    . Mohs surgery       Precancerous skin lesion  . Removal of fallopion tube & ovary  1986  . Removal of cyst & other ovary  09/2004  . Anus surgery       Family History  Problem Relation Age of Onset  . Hypertension Mother   . Hyperlipidemia Mother   . Nephrolithiasis Mother   . Hypertension Father   . Hyperlipidemia Father   . Nephrolithiasis Father   . Diabetes Maternal Uncle   . Diabetes Mother      History   Social History  . Marital Status: Married    Spouse Name: N/A  . Number of Children: N/A  . Years of Education: N/A   Occupational History  . Communications    Social History Main Topics  . Smoking status: Former Smoker    Quit date: 02/18/2010  . Smokeless tobacco: Not on file     Comment: Married, lives with spouse. employed by [employer] engineer  . Alcohol Use: Yes     Comment: Occassional  . Drug Use: No  . Sexual Activity: Not on file   Other Topics Concern  . Not on file   Social History Narrative    Review of Systems  Constitutional: Denies fever, chills, fatigue, or significant weight gain/loss. HENT: Head: Denies headache or neck pain Ears: Denies changes in hearing, ringing in ears, earache, drainage Nose: Denies discharge, stuffiness, itching, nosebleed, sinus pain Throat: Denies sore throat, hoarseness, dry mouth, sores, thrush Eyes: Denies loss/changes in vision, pain, redness, blurry/double vision, flashing lights Cardiovascular: Denies chest pain/discomfort, tightness, palpitations, shortness of breath with activity, difficulty lying down, swelling, sudden awakening with shortness of breath Respiratory: Denies shortness of breath, cough, sputum production, wheezing Gastrointestinal: Denies dysphasia, heartburn, change in appetite, nausea, change in bowel habits, rectal bleeding, constipation, diarrhea, yellow skin or eyes Genitourinary: Denies frequency, urgency, burning/pain, blood in urine, incontinence, change in urinary  strength. Musculoskeletal: Denies muscle/joint pain, stiffness, back pain, redness or swelling of joints, trauma Skin: Denies rashes, lumps, itching, dryness, color changes, or hair/nail changes Neurological: Denies dizziness, fainting, seizures, weakness, numbness, tingling, tremor Psychiatric - Denies nervousness, stress, depression or memory loss Endocrine: Denies heat or cold intolerance, sweating, frequent urination, excessive thirst, changes in appetite Hematologic: Denies ease of bruising or bleeding     Objective:    BP 118/78 mmHg  Pulse 101  Temp(Src) 97.8 F (36.6 C) (Oral)  Resp 18  Ht 5\' 4"  (1.626 m)  Wt 244 lb (110.678 kg)  BMI 41.86 kg/m2  SpO2 98% Nursing note and vital signs reviewed.  Physical Exam  Constitutional: She is oriented  to person, place, and time. She appears well-developed and well-nourished.  HENT:  Head: Normocephalic.  Right Ear: Hearing, tympanic membrane, external ear and ear canal normal.  Left Ear: Hearing, tympanic membrane, external ear and ear canal normal.  Nose: Nose normal.  Mouth/Throat: Uvula is midline, oropharynx is clear and moist and mucous membranes are normal.  Eyes: Conjunctivae and EOM are normal. Pupils are equal, round, and reactive to light.  Neck: Neck supple. No JVD present. No tracheal deviation present. No thyromegaly present.  Cardiovascular: Normal rate, regular rhythm, normal heart sounds and intact distal pulses.   Pulmonary/Chest: Effort normal and breath sounds normal.  Abdominal: Soft. Bowel sounds are normal. She exhibits no distension and no mass. There is no tenderness. There is no rebound and no guarding.  Musculoskeletal: Normal range of motion. She exhibits no edema or tenderness.  Lymphadenopathy:    She has no cervical adenopathy.  Neurological: She is alert and oriented to person, place, and time. She has normal reflexes. No cranial nerve deficit. She exhibits normal muscle tone. Coordination normal.    Skin: Skin is warm and dry.  Psychiatric: She has a normal mood and affect. Her behavior is normal. Judgment and thought content normal.       Assessment & Plan:   Problem List Items Addressed This Visit      Other   Pre-employment health screening examination - Primary    Medical screening performed and patient is healthy to begin work without restrictions. A TB test was placed and will be read in 48 hours. Form was received and will be completed.

## 2015-05-26 NOTE — Patient Instructions (Signed)
Thank you for choosing [facility].  Summary/Instructions:  Please return to the office on Friday to have your TB test read.

## 2015-05-27 ENCOUNTER — Telehealth: Payer: Self-pay | Admitting: Endocrinology

## 2015-05-27 NOTE — Telephone Encounter (Signed)
Patient called stating that a new order needs to be faxed to [facility]    Please advise    Thank You

## 2015-05-28 DIAGNOSIS — Z111 Encounter for screening for respiratory tuberculosis: Secondary | ICD-10-CM

## 2015-05-28 DIAGNOSIS — Z021 Encounter for pre-employment examination: Secondary | ICD-10-CM | POA: Diagnosis not present

## 2015-05-28 LAB — TB SKIN TEST
Induration: 0 mm
TB Skin Test: NEGATIVE

## 2015-05-28 NOTE — Telephone Encounter (Signed)
done

## 2015-05-28 NOTE — Addendum Note (Signed)
Addended by: Delice Bison E on: 05/28/2015 02:57 PM   Modules accepted: Orders

## 2015-06-17 ENCOUNTER — Telehealth: Payer: Self-pay

## 2015-06-17 NOTE — Telephone Encounter (Signed)
Pt stated that she needs Levemir and Victoza refills.She is on pt asst. Levemir;20 units in am;44 units in pm Victoza 1.2

## 2015-06-18 NOTE — Telephone Encounter (Signed)
Pt was requesting a refill I sent in

## 2015-09-09 ENCOUNTER — Telehealth: Payer: Self-pay | Admitting: Family

## 2015-09-09 NOTE — Telephone Encounter (Signed)
Ok with me 

## 2015-09-09 NOTE — Telephone Encounter (Signed)
Pt request to transfer from [facility] to Dr. Sharlet Salina. Please advise.

## 2015-09-10 ENCOUNTER — Telehealth: Payer: Self-pay | Admitting: Family

## 2015-09-10 LAB — HM MAMMOGRAPHY: HM MAMMO: NORMAL

## 2015-09-10 NOTE — Telephone Encounter (Signed)
Pt request to transfer from [place] to Dr. Sharlet Salina, [name] is [facility] with it, Please advise Dr. Sharlet Salina.   Please call pt [phone]

## 2015-09-13 ENCOUNTER — Other Ambulatory Visit (INDEPENDENT_AMBULATORY_CARE_PROVIDER_SITE_OTHER): Payer: 59

## 2015-09-13 DIAGNOSIS — E1165 Type 2 diabetes mellitus with hyperglycemia: Secondary | ICD-10-CM | POA: Diagnosis not present

## 2015-09-13 DIAGNOSIS — IMO0002 Reserved for concepts with insufficient information to code with codable children: Secondary | ICD-10-CM

## 2015-09-13 LAB — BASIC METABOLIC PANEL
BUN: 14 mg/dL (ref 6–23)
CHLORIDE: 98 meq/L (ref 96–112)
CO2: 32 mEq/L (ref 19–32)
Calcium: 10 mg/dL (ref 8.4–10.5)
Creatinine, Ser: 0.51 mg/dL (ref 0.40–1.20)
GFR: 130.45 mL/min (ref >60.00)
Glucose, Bld: 135 mg/dL — ABNORMAL HIGH (ref 70–99)
POTASSIUM: 4.2 meq/L (ref 3.5–5.1)
Sodium: 136 mEq/L (ref 135–145)

## 2015-09-13 LAB — HEMOGLOBIN A1C: HEMOGLOBIN A1C: 6.8 % — AB (ref 4.6–6.5)

## 2015-09-15 ENCOUNTER — Ambulatory Visit (INDEPENDENT_AMBULATORY_CARE_PROVIDER_SITE_OTHER): Payer: 59 | Admitting: Endocrinology

## 2015-09-15 VITALS — BP 132/84 | HR 100 | Temp 98.4°F | Resp 16 | Ht 64.0 in | Wt 243.2 lb

## 2015-09-15 DIAGNOSIS — Z23 Encounter for immunization: Secondary | ICD-10-CM

## 2015-09-15 DIAGNOSIS — E1165 Type 2 diabetes mellitus with hyperglycemia: Secondary | ICD-10-CM | POA: Diagnosis not present

## 2015-09-15 DIAGNOSIS — I10 Essential (primary) hypertension: Secondary | ICD-10-CM | POA: Diagnosis not present

## 2015-09-15 DIAGNOSIS — Z794 Long term (current) use of insulin: Secondary | ICD-10-CM

## 2015-09-15 NOTE — Progress Notes (Signed)
Patient ID: Destiny Tucker, female   DOB: 03-Feb-1956, 59 y.o.   MRN: [id number]   Reason for Appointment: Diabetes follow-up   History of Present Illness   Diagnosis: Type 2 DIABETES MELITUS, date of diagnosis:  08/2010     Previous history: She has been on various regimens for her diabetes including [place] in the past. Because of progressively higher fasting readings she was started on Levemir insulin in 2013 She usually tends to have relatively high fasting readings  Recent history:   She has not been seen in follow-up since 02/2015 Although her A1c is fairly good at 6.8 she has mostly higher readings in the mornings and averages are still about the same as before   Current blood sugar patterns and problems:  She has gone up another 4 units on her evening Levemir since fasting readings had been high but she has only a couple of readings near target before her first meal  Difficult to know which readings are actually fasting as sometimes she checks her sugar around 10-11 AM   She is apparently not eating breakfast in the morning and will eat at her break in the morning while at work  She is taking somewhat more Humalog in the morning that in the evening although her morning meal is relatively lighter  Sometimes will not have protein at breakfast  Despite reminders she is not checking her readings after meals and has only one reading at bedtime  She is asking about stopping Victoza but has difficulty losing weight  Still takes metformin only at bedtime     Oral hypoglycemic drugs: Amaryl in am, Metformin 1g hs      Side effects from medications: None  Insulin regimen:  Humalog 12-16  before meals, Levemir 20 in am and 44 units at night            Monitors blood glucose:  1-2 times a day.    Glucometer:         Blood Glucose readings from meter:  Mean values apply above for all meters except median for One Touch  PRE-MEAL Fasting Lunch Dinner Bedtime Overall    Glucose range:  94-213    153   Mean/median:  170      169       Hypoglycemia:  occasionally will feel shaky and weak around 4-5 PM but will not check her sugar then  Meals: 3 meals per day: Breakfast at 11; dinner 7 pm; snacks at times       Physical activity: active at work             Dietician visit: Most recent: At diagnosis and with nurse educator periodically      Weight control:  Wt Readings from Last 3 Encounters:  09/15/15 243 lb 3.2 oz (110.315 kg)  05/26/15 244 lb (110.678 kg)  04/28/15 245 lb 8 oz (111.358 kg)         Diabetes labs:  Lab Results  Component Value Date   HGBA1C 6.8* 09/13/2015   HGBA1C 6.9* 03/15/2015   HGBA1C 7.1* 10/23/2014   Lab Results  Component Value Date   MICROALBUR 0.3 10/01/2013   LDLCALC 46 06/16/2014   CREATININE 0.51 09/13/2015      Medication List       This list is accurate as of: 09/15/15 11:59 PM.  Always use your most recent med list.               ASPIR-LOW  81 MG EC tablet  Generic drug:  aspirin  TAKE 1 TABLET BY MOUTH ONCE DAILY     B-D SINGLE USE SWABS REGULAR Pads  USE AS DIRECTED TWICE DAILY     clonazePAM 1 MG tablet  Commonly known as:  KLONOPIN  TAKE ONE TABLET BY MOUTH TWICE DAILY AS NEEDED FOR ANXIETY     diphenhydrAMINE 25 MG tablet  Commonly known as:  BENADRYL  Take 25 mg by mouth every 6 (six) hours as needed.     glimepiride 4 MG tablet  Commonly known as:  AMARYL  Take 1 tablet (4 mg total) by mouth daily with breakfast.     glucose blood test strip  Use to test up to 3 times daily     Insulin Detemir 100 UNIT/ML Pen  Commonly known as:  LEVEMIR  Inject 20-44 Units into the skin 2 (two) times daily. Takes 20 in am and 44 units at night     insulin lispro 100 UNIT/ML cartridge  Commonly known as:  HUMALOG  Inject 0.04-0.08 mLs (4-8 Units total) into the skin 3 (three) times daily with meals. Patient tries to gage dose depending on carbs     Insulin Syringe-Needle U-100 31G X 5/16"  0.3 ML Misc  Commonly known as:  B-D INSULIN SYRINGE  Use 2 syringes daily with Levemir Insulin     lovastatin 20 MG tablet  Commonly known as:  MEVACOR  Take 1 tablet (20 mg total) by mouth at bedtime.     metFORMIN 1000 MG tablet  Commonly known as:  GLUCOPHAGE  Take 1 tablet (1,000 mg total) by mouth 2 (two) times daily with a meal.     multivitamin with minerals tablet  Take 1 tablet by mouth daily.     naproxen 500 MG tablet  Commonly known as:  NAPROSYN  Take 1 tablet (500 mg total) by mouth 2 (two) times daily with a meal.     NOVOFINE 32G X 6 MM Misc  Generic drug:  Insulin Pen Needle  USE 3 TIMES A DAY     omega-3 fish oil 1000 MG Caps capsule  Commonly known as:  MAXEPA  Take by mouth.     ONETOUCH DELICA LANCETS Misc  Use up to three times a day     ONETOUCH DELICA LANCETS 65H Misc  USE TO TEST UP TO 3 TIMES DAILY     sertraline 100 MG tablet  Commonly known as:  ZOLOFT  TAKE 1 TABLET BY MOUTH ONCE DAILY     triamterene-hydrochlorothiazide 37.5-25 MG capsule  Commonly known as:  DYAZIDE  Take 1 each (1 capsule total) by mouth daily.     TURMERIC PO  Take by mouth.     VICTOZA 18 MG/3ML Sopn  Generic drug:  Liraglutide  Inject 1.2 Units into the skin daily. Plus 3 clicks        Allergies:  Allergies  Allergen Reactions  . Codeine     Past Medical History  Diagnosis Date  . OSTEOPENIA   . TOBACCO USE, QUIT   . HYPERTENSION   . HYPERLIPIDEMIA   . DEPRESSION   . ANXIETY   . ALLERGIC RHINITIS   . Diabetes mellitus   . Internal hemorrhoids without mention of complication 84/69/6295    Colonoscopy--Dr. Fuller Plan , pts. states hemorrhoids are uncomfortable    Past Surgical History  Procedure Laterality Date  . Unilateral salpingo-oophorectomy    . Mohs surgery      Precancerous skin lesion  .  Removal of fallopion tube & ovary  1986  . Removal of cyst & other ovary  09/2004  . Anus surgery      Family History  Problem Relation Age of  Onset  . Hypertension Mother   . Hyperlipidemia Mother   . Nephrolithiasis Mother   . Hypertension Father   . Hyperlipidemia Father   . Nephrolithiasis Father   . Diabetes Maternal Uncle   . Diabetes Mother     Social History:  reports that she quit smoking about 5 years ago. She does not have any smokeless tobacco history on file. She reports that she drinks alcohol. She reports that she does not use illicit drugs.  Review of Systems:  Hypertension:  mild and treated with only Dyazide 25, appears anxious today   Lipids: She is only on 20 mg of lovastatin with good control. Triglycerides under 200  Lab Results  Component Value Date   CHOL 114 06/16/2014   HDL 31.30* 06/16/2014   LDLCALC 46 06/16/2014   LDLDIRECT 68.7 10/01/2013   TRIG 184.0* 06/16/2014   CHOLHDL 4 06/16/2014    She is asking about some itching in her fingers at times       LABS:  Appointment on 09/13/2015  Component Date Value Ref Range Status  . Hgb A1c MFr Bld 09/13/2015 6.8* 4.6 - 6.5 % Final   Glycemic Control Guidelines for People with Diabetes:Non Diabetic:  <6%Goal of Therapy: <7%Additional Action Suggested:  >8%   . Sodium 09/13/2015 136  135 - 145 mEq/L Final  . Potassium 09/13/2015 4.2  3.5 - 5.1 mEq/L Final  . Chloride 09/13/2015 98  96 - 112 mEq/L Final  . CO2 09/13/2015 32  19 - 32 mEq/L Final  . Glucose, Bld 09/13/2015 135* 70 - 99 mg/dL Final  . BUN 09/13/2015 14  6 - 23 mg/dL Final  . Creatinine, Ser 09/13/2015 0.51  0.40 - 1.20 mg/dL Final  . Calcium 09/13/2015 10.0  8.4 - 10.5 mg/dL Final  . GFR 09/13/2015 130.45  >60.00 mL/min Final     Examination:   BP 132/84 mmHg  Pulse 100  Temp(Src) 98.4 F (36.9 C)  Resp 16  Ht 5\' 4"  (1.626 m)  Wt 243 lb 3.2 oz (110.315 kg)  BMI 41.72 kg/m2  SpO2 97%  Body mass index is 41.72 kg/(m^2).   No ankle edema  ASSESSMENT/ PLAN:   Diabetes type 2   Blood glucose control is fair with consistently have fasting readings See history of  present illness for detailed discussion of his current management, blood sugar patterns and problems identified Again fasting readings consistently high except on a couple of occasions Not clear if readings are higher because of variable intake in the evenings She does not remember to check her readings after meals especially in the evening  She reports hypoglycemic symptoms in the late afternoon and discussed that this is not related to her being late for a meal but because of potential hypoglycemia with her Levemir in the morning She  needs to reduce Levemir in the morning and increase it in the evening to get fasting readings to target, discussed need to check more readings in the late afternoon especially when symptomatic Discussed how to increase her doses in the evenings every 3-4 days  Also explained to her the blood sugar targets after meals and she needs to do to our readings in the evenings Encouraged her to stay on Victoza but can stop Amaryl She also needs to have  consistent exercise program    Patient Instructions  Check blood sugars on waking up 3  times a week Also check blood sugars about 2 hours after a meal and do this after different meals by rotation  Recommended blood sugar levels on waking up is 90-130 and about 2 hours after meal is 130-160  Please bring your blood sugar monitor to each visit, thank you  Levemir to 16 in am and 46 in pm daily  May need more sugars after supper   Walk on weekends 30 min  Stop Glimeperide   Counseling time on subjects discussed above is over 50% of today's 25 minute visit   Curlie Macken 09/16/2015, 8:21 AM

## 2015-09-15 NOTE — Patient Instructions (Addendum)
Check blood sugars on waking up 3  times a week Also check blood sugars about 2 hours after a meal and do this after different meals by rotation  Recommended blood sugar levels on waking up is 90-130 and about 2 hours after meal is 130-160  Please bring your blood sugar monitor to each visit, thank you  Levemir to 16 in am and 46 in pm daily  May need more sugars after supper   Walk on weekends 30 min  Stop Glimeperide

## 2015-09-17 ENCOUNTER — Encounter: Payer: Self-pay | Admitting: Family

## 2015-09-21 ENCOUNTER — Telehealth: Payer: Self-pay

## 2015-09-21 ENCOUNTER — Telehealth: Payer: Self-pay | Admitting: Endocrinology

## 2015-09-21 NOTE — Telephone Encounter (Signed)
I contacted the pt and left a voicemail advising her pen needles and insulin have been delivered from patient assistance.

## 2015-09-21 NOTE — Telephone Encounter (Signed)
Pt asking if we can contact the humalog and victoza pt assistance she is low on each one

## 2015-09-27 NOTE — Telephone Encounter (Signed)
We need to submit new pt assistance for [facility] which is the humalog and victoza

## 2015-09-28 ENCOUNTER — Encounter: Payer: Self-pay | Admitting: Family

## 2015-09-28 ENCOUNTER — Ambulatory Visit (INDEPENDENT_AMBULATORY_CARE_PROVIDER_SITE_OTHER): Payer: 59 | Admitting: Family

## 2015-09-28 VITALS — BP 130/88 | HR 95 | Temp 98.3°F | Resp 18 | Ht 64.0 in | Wt 244.8 lb

## 2015-09-28 DIAGNOSIS — J0100 Acute maxillary sinusitis, unspecified: Secondary | ICD-10-CM

## 2015-09-28 DIAGNOSIS — G5601 Carpal tunnel syndrome, right upper limb: Secondary | ICD-10-CM | POA: Diagnosis not present

## 2015-09-28 DIAGNOSIS — G5600 Carpal tunnel syndrome, unspecified upper limb: Secondary | ICD-10-CM | POA: Insufficient documentation

## 2015-09-28 DIAGNOSIS — J019 Acute sinusitis, unspecified: Secondary | ICD-10-CM | POA: Insufficient documentation

## 2015-09-28 MED ORDER — AMOXICILLIN-POT CLAVULANATE 875-125 MG PO TABS: 1.0000 | ORAL_TABLET | Freq: Two times a day (BID) | ORAL | Status: DC

## 2015-09-28 NOTE — Progress Notes (Signed)
Subjective:    Patient ID: Destiny Tucker, female    DOB: 1956-02-10, 59 y.o.   MRN: [id number]  Chief Complaint  Patient presents with  . Sinusitis  . Numbness    HPI:  Destiny Tucker is a 59 y.o. female who  has a past medical history of OSTEOPENIA; TOBACCO USE, QUIT; HYPERTENSION; HYPERLIPIDEMIA; DEPRESSION; ANXIETY; ALLERGIC RHINITIS; Diabetes mellitus ([place]); and Internal hemorrhoids without mention of complication (62/95/2841). and presents today for an acute office visit.  1.) Sinus issues - This is a new problem.  Associated symptom of sinus pressure, headaches, congestion, and coughing has been going on for several days. Modifying factor include mucinex which has helped a little with the symptoms. Indicates she originally felt better but today feels a little worse. Denies any recent antibiotics.  2.) Numbness and tingling - Associated symptom of numbness and tingling has been going on for several months and is located in her right wrist and fingers. Describes that her fingers feel numb and tingling that comes and goes. Timing of the symptoms is intermittent.    Allergies  Allergen Reactions  . Codeine      Current Outpatient Prescriptions on File Prior to Visit  Medication Sig Dispense Refill  . Alcohol Swabs (B-D SINGLE USE SWABS REGULAR) PADS USE AS DIRECTED TWICE DAILY 100 each 9  . ASPIR-LOW 81 MG EC tablet TAKE 1 TABLET BY MOUTH ONCE DAILY 90 tablet 3  . clonazePAM (KLONOPIN) 1 MG tablet TAKE ONE TABLET BY MOUTH TWICE DAILY AS NEEDED FOR ANXIETY 180 tablet 0  . diphenhydrAMINE (BENADRYL) 25 MG tablet Take 25 mg by mouth every 6 (six) hours as needed.    [name] glimepiride (AMARYL) 4 MG tablet Take 1 tablet (4 mg total) by mouth daily with breakfast. 90 tablet 0  . glucose blood test strip Use to test up to 3 times daily 300 each 1  . Insulin Detemir (LEVEMIR) 100 UNIT/ML Pen Inject 20-44 Units into the skin 2 (two) times daily. Takes 20 in am and 44 units at night    .  insulin lispro (HUMALOG) 100 UNIT/ML cartridge Inject 0.04-0.08 mLs (4-8 Units total) into the skin 3 (three) times daily with meals. Patient tries to gage dose depending on carbs (Patient taking differently: Inject 12-16 Units into the skin 3 (three) times daily with meals. Patient tries to gage dose depending on carbs) 15 mL 3  . Insulin Syringe-Needle U-100 (B-D INSULIN SYRINGE) 31G X 5/16" 0.3 ML MISC Use 2 syringes daily with Levemir Insulin 100 each 3  . Liraglutide (VICTOZA) 18 MG/3ML SOPN Inject 1.2 Units into the skin daily. Plus 3 clicks    . lovastatin (MEVACOR) 20 MG tablet Take 1 tablet (20 mg total) by mouth at bedtime. 90 tablet 3  . metFORMIN (GLUCOPHAGE) 1000 MG tablet Take 1 tablet (1,000 mg total) by mouth 2 (two) times daily with a meal. (Patient taking differently: Take 1,000 mg by mouth daily with breakfast. ) 180 tablet 1  . Multiple Vitamins-Minerals (MULTIVITAMIN WITH MINERALS) tablet Take 1 tablet by mouth daily.    . naproxen (NAPROSYN) 500 MG tablet Take 1 tablet (500 mg total) by mouth 2 (two) times daily with a meal. 30 tablet 0  . NOVOFINE 32G X 6 MM MISC USE 3 TIMES A DAY 300 each 1  . omega-3 fish oil (MAXEPA) 1000 MG CAPS capsule Take by mouth.    [name] DELICA LANCETS 32G MISC USE TO TEST UP TO 3 TIMES  DAILY 300 each 1  . ONETOUCH DELICA LANCETS MISC Use up to three times a day 300 each 1  . sertraline (ZOLOFT) 100 MG tablet TAKE 1 TABLET BY MOUTH ONCE DAILY 90 tablet 3  . triamterene-hydrochlorothiazide (DYAZIDE) 37.5-25 MG per capsule Take 1 each (1 capsule total) by mouth daily. 90 capsule 3  . TURMERIC PO Take by mouth.     No current facility-administered medications on file prior to visit.    Past Medical History  Diagnosis Date  . OSTEOPENIA   . TOBACCO USE, QUIT   . HYPERTENSION   . HYPERLIPIDEMIA   . DEPRESSION   . ANXIETY   . ALLERGIC RHINITIS   . Diabetes mellitus (Destiny Tucker)   . Internal hemorrhoids without mention of complication 31/49/7026     Colonoscopy--Dr. Fuller Plan , pts. states hemorrhoids are uncomfortable    Review of Systems  Constitutional: Negative for fever and chills.  HENT: Positive for congestion and sinus pressure. Negative for sore throat.   Respiratory: Positive for cough. Negative for chest tightness and shortness of breath.   Neurological: Positive for headaches.      Objective:    BP 130/88 mmHg  Pulse 95  Temp(Src) 98.3 F (36.8 C) (Oral)  Resp 18  Ht 5\' 4"  (1.626 m)  Wt 244 lb 12.8 oz (111.041 kg)  BMI 42.00 kg/m2  SpO2 97% Nursing note and vital signs reviewed.  Physical Exam  Constitutional: She is oriented to person, place, and time. She appears well-developed and well-nourished. No distress.  HENT:  Right Ear: Hearing, tympanic membrane, external ear and ear canal normal.  Left Ear: Hearing, tympanic membrane, external ear and ear canal normal.  Nose: Right sinus exhibits maxillary sinus tenderness and frontal sinus tenderness. Left sinus exhibits maxillary sinus tenderness and frontal sinus tenderness.  Mouth/Throat: Uvula is midline, oropharynx is clear and moist and mucous membranes are normal.  Neck: Neck supple.  Cardiovascular: Normal rate, regular rhythm, normal heart sounds and intact distal pulses.   Pulmonary/Chest: Effort normal and breath sounds normal.  Musculoskeletal:  Right wrist - No obvious deformity, discoloration or edema. No tenderness able to be elicited. Positive Tinel's and Phalen's sign. ROM is normal in all directions. Proximal pulses are intact and capillary refill is appropriate.   Neurological: She is alert and oriented to person, place, and time.  Skin: Skin is warm and dry.  Psychiatric: She has a normal mood and affect. Her behavior is normal. Judgment and thought content normal.       Assessment & Plan:   Problem List Items Addressed This Visit      Respiratory   Acute sinusitis - Primary    Symptoms and exam consistent with sinusitis. Start Augmentin.  Continue over-the-counter medications as needed for symptom relief and supportive care. Follow-up if symptoms worsen or fail to improve.      Relevant Medications   amoxicillin-clavulanate (AUGMENTIN) 875-125 MG tablet     Nervous and Auditory   Carpal tunnel syndrome    Symptoms and exam consistent with carpal tunnel syndrome. Treat conservatively at this time with cockup wrist splints as needed for support especially at night. Continue current dosage of naproxen as needed for discomfort. Follow-up if symptoms worsen or fail to improve.

## 2015-09-28 NOTE — Progress Notes (Signed)
Pre visit review using our clinic review tool, if applicable. No additional management support is needed unless otherwise documented below in the visit note. 

## 2015-09-28 NOTE — Assessment & Plan Note (Signed)
Symptoms and exam consistent with carpal tunnel syndrome. Treat conservatively at this time with cockup wrist splints as needed for support especially at night. Continue current dosage of naproxen as needed for discomfort. Follow-up if symptoms worsen or fail to improve.

## 2015-09-28 NOTE — Patient Instructions (Signed)
Thank you for choosing [facility].  Summary/Instructions:  Your prescription(s) have been submitted to your pharmacy or been printed and provided for you. Please take as directed and contact our office if you believe you are having problem(s) with the medication(s) or have any questions.  If your symptoms worsen or fail to improve, please contact our office for further instruction, or in case of emergency go directly to the emergency room at the closest medical facility.      Carpal Tunnel Syndrome Carpal tunnel syndrome is a condition that causes pain in your hand and arm. The carpal tunnel is a narrow area located on the palm side of your wrist. Repeated wrist motion or certain diseases may cause swelling within the tunnel. This swelling pinches the main nerve in the wrist (median nerve). CAUSES  This condition may be caused by:  1. Repeated wrist motions. 2. Wrist injuries. 3. Arthritis. 4. A cyst or tumor in the carpal tunnel. 5. Fluid buildup during pregnancy. Sometimes the cause of this condition is not known.  RISK FACTORS This condition is more likely to develop in:  1. People who have jobs that cause them to repeatedly move their wrists in the same motion, such as Art gallery manager. 2. Women. 3. People with certain conditions, such as: 1. Diabetes. 2. Obesity. 3. An underactive thyroid (hypothyroidism). 4. Kidney failure. SYMPTOMS  Symptoms of this condition include:  1. A tingling feeling in your fingers, especially in your thumb, index, and middle fingers. 2. Tingling or numbness in your hand. 3. An aching feeling in your entire arm, especially when your wrist and elbow are bent for long periods of time. 4. Wrist pain that goes up your arm to your shoulder. 5. Pain that goes down into your palm or fingers. 6. A weak feeling in your hands. You may have trouble grabbing and holding items. Your symptoms may feel worse during the night.  DIAGNOSIS  This  condition is diagnosed with a medical history and physical exam. You may also have tests, including:  1. An electromyogram (EMG). This test measures electrical signals sent by your nerves into the muscles. 2. X-rays. TREATMENT  Treatment for this condition includes: 1. Lifestyle changes. It is important to stop doing or modify the activity that caused your condition. 2. Physical or occupational therapy. 3. Medicines for pain and inflammation. This may include medicine that is injected into your wrist. 4. A wrist splint. 5. Surgery. HOME CARE INSTRUCTIONS  If You Have a Splint: 1. Wear it as told by your health care provider. Remove it only as told by your health care provider. 2. Loosen the splint if your fingers become numb and tingle, or if they turn cold and blue. 3. Keep the splint clean and dry. General Instructions  Take over-the-counter and prescription medicines only as told by your health care provider.  Rest your wrist from any activity that may be causing your pain. If your condition is work related, talk to your employer about changes that can be made, such as getting a wrist pad to use while typing.  If directed, apply ice to the painful area:  Put ice in a plastic bag.  Place a towel between your skin and the bag.  Leave the ice on for 20 minutes, 2-3 times per day.  Keep all follow-up visits as told by your health care provider. This is important.  Do any exercises as told by your health care provider, physical therapist, or occupational therapist. [facility]  CARE IF:  1. You have new symptoms. 2. Your pain is not controlled with medicines. 3. Your symptoms get worse.   This information is not intended to replace advice given to you by your health care provider. Make sure you discuss any questions you have with your health care provider.   Document Released: 11/03/2000 Document Revised: 07/28/2015 Document Reviewed: 03/24/2015 Elsevier Interactive Patient  Education 2016 [employer]  General Recommendations:    Please drink plenty of fluids.  Get plenty of rest   Sleep in humidified air  Use saline nasal sprays  Netti pot   OTC Medications:  Decongestants - helps relieve congestion   Flonase (generic fluticasone) or Nasacort (generic triamcinolone) - please make sure to use the "cross-over" technique at a 45 degree angle towards the opposite eye as opposed to straight up the nasal passageway.   Sudafed (generic pseudoephedrine - Note this is the one that is available behind the pharmacy counter); Products with phenylephrine (-PE) may also be used but is often not as effective as pseudoephedrine.   If you have HIGH BLOOD PRESSURE - Coricidin HBP; AVOID any product that is -D as this contains pseudoephedrine which may increase your blood pressure.  Afrin (oxymetazoline) every 6-8 hours for up to 3 days.   Allergies - helps relieve runny nose, itchy eyes and sneezing   Claritin (generic loratidine), Allegra (fexofenidine), or Zyrtec (generic cyrterizine) for runny nose. These medications should not cause drowsiness.  Note - Benadryl (generic diphenhydramine) may be used however may cause drowsiness  Cough -   Delsym or Robitussin (generic dextromethorphan)  Expectorants - helps loosen mucus to ease removal   Mucinex (generic guaifenesin) as directed on the package.  Headaches / General Aches   Tylenol (generic acetaminophen) - DO NOT EXCEED 3 grams (3,000 mg) in a 24 hour time period  Advil/Motrin (generic ibuprofen)   Sore Throat -   Salt water gargle   Chloraseptic (generic benzocaine) spray or lozenges / Sucrets (generic dyclonine)

## 2015-09-28 NOTE — Assessment & Plan Note (Signed)
Symptoms and exam consistent with sinusitis. Start Augmentin. Continue over-the-counter medications as needed for symptom relief and supportive care. Follow-up if symptoms worsen or fail to improve.

## 2015-10-01 ENCOUNTER — Telehealth: Payer: Self-pay | Admitting: Endocrinology

## 2015-10-01 NOTE — Telephone Encounter (Signed)
Patient would like for you to call her °

## 2015-10-01 NOTE — Telephone Encounter (Signed)
Left message for patient to call back on Monday 

## 2015-10-04 NOTE — Telephone Encounter (Signed)
Fine, can be scheduled for transfer visit.

## 2015-10-04 NOTE — Telephone Encounter (Signed)
Pt will call back to schedule an appt.

## 2015-10-04 NOTE — Telephone Encounter (Signed)
Patient called back on Friday evening and left a message on Linda's phone, she said the patient assistance program had not received her application and that she was almost out of Insulin, Application was faxed on 11/10 at 3:52 with confirmation. Vaughan Basta called the patient back to let her know this had been faxed, she then asked Vaughan Basta to have me call the 1-800 # to give them a verbal order because she was almost out of insulin.  I called [employer] patient assistance who said it take 3-5 business days to process, Application was re-faxed to a different fax #.  Patient tried to tell Vaughan Basta she only got 2 boxes of 3 pens each on her Levemir, (she got 5 boxes) and that she was out of Levemir.  Linda probed further and she finally admitted she did have her Levemir but actually needed her Humalog.

## 2015-10-05 ENCOUNTER — Telehealth: Payer: Self-pay | Admitting: Endocrinology

## 2015-10-05 NOTE — Telephone Encounter (Signed)
Patient called stating that she is almost out of her Humalog  Mrs. Thwaites was advised by Ralph Leyden that Suanne Marker can go ahead and call ina verbal refill and put a rush on it   Please advise   Thank you

## 2015-10-06 NOTE — Telephone Encounter (Signed)
Ms. Ramirez is requesting a call back from [facility]

## 2015-10-06 NOTE — Telephone Encounter (Signed)
Returned pt's call. Pt wanted to thank me for giving the verbal order for her patient asst with [employer] for her insulin.

## 2015-10-11 ENCOUNTER — Telehealth: Payer: Self-pay | Admitting: Family

## 2015-10-12 ENCOUNTER — Telehealth: Payer: Self-pay | Admitting: Nutrition

## 2015-10-12 NOTE — Telephone Encounter (Signed)
Told her that her Humalog was here and that she needed to pick it up today or tomorrow.  And that we will be closed on Thursday and Friday this week

## 2015-10-13 LAB — HM DIABETES EYE EXAM

## 2015-10-21 ENCOUNTER — Other Ambulatory Visit: Payer: Self-pay | Admitting: *Deleted

## 2015-10-21 MED ORDER — METFORMIN HCL 1000 MG PO TABS: 1000.0000 mg | ORAL_TABLET | Freq: Two times a day (BID) | ORAL | Status: DC

## 2015-10-29 ENCOUNTER — Other Ambulatory Visit: Payer: Self-pay | Admitting: Internal Medicine

## 2015-11-01 ENCOUNTER — Other Ambulatory Visit: Payer: Self-pay | Admitting: Internal Medicine

## 2015-11-10 ENCOUNTER — Other Ambulatory Visit: Payer: Self-pay | Admitting: Internal Medicine

## 2015-11-11 ENCOUNTER — Telehealth: Payer: Self-pay | Admitting: Endocrinology

## 2015-11-11 NOTE — Telephone Encounter (Signed)
Need to have her come in for discussion, will recommend Humulin R U-500 insulin instead of Levemir. Need to know what times of the day her sugars are high

## 2015-11-11 NOTE — Telephone Encounter (Signed)
Patient called stating that her blood sugars have been higher than normal   Blood Sugars: 200-201  Insulin dosage 16 units in am  46 units pm   Please advise    Thank you

## 2015-11-11 NOTE — Telephone Encounter (Signed)
Pt left msg on triage stating she took her last Triamterene today she is needing refill [facility] has sent request. Sent refill back ok # 30 only must estab w/new provider for refills...Johny Chess

## 2015-11-11 NOTE — Telephone Encounter (Signed)
Message left on her voice mail asking her to call to schedule appointment to discuss this with you.

## 2015-11-11 NOTE — Telephone Encounter (Signed)
Please see below and advise.

## 2015-11-16 ENCOUNTER — Other Ambulatory Visit: Payer: Self-pay | Admitting: Family

## 2015-11-16 NOTE — Telephone Encounter (Signed)
Please advise, thanks.

## 2015-11-17 NOTE — Telephone Encounter (Signed)
Faxed to [facility]

## 2015-12-03 ENCOUNTER — Ambulatory Visit (INDEPENDENT_AMBULATORY_CARE_PROVIDER_SITE_OTHER): Payer: BLUE CROSS/BLUE SHIELD | Admitting: Endocrinology

## 2015-12-03 ENCOUNTER — Encounter: Payer: Self-pay | Admitting: Endocrinology

## 2015-12-03 VITALS — BP 118/70 | Temp 97.8°F | Ht 64.0 in | Wt 240.0 lb

## 2015-12-03 DIAGNOSIS — Z794 Long term (current) use of insulin: Secondary | ICD-10-CM | POA: Diagnosis not present

## 2015-12-03 DIAGNOSIS — E1165 Type 2 diabetes mellitus with hyperglycemia: Secondary | ICD-10-CM | POA: Diagnosis not present

## 2015-12-03 MED ORDER — CANAGLIFLOZIN 100 MG PO TABS: 100.0000 mg | ORAL_TABLET | Freq: Every day | ORAL | Status: DC

## 2015-12-03 MED ORDER — INSULIN REGULAR HUMAN (CONC) 500 UNIT/ML ~~LOC~~ SOPN: PEN_INJECTOR | SUBCUTANEOUS | Status: DC

## 2015-12-03 NOTE — Progress Notes (Signed)
Pre visit review using our clinic review tool, if applicable. No additional management support is needed unless otherwise documented below in the visit note. 

## 2015-12-03 NOTE — Patient Instructions (Signed)
Humulin U-500 insulin 45 units before Bfst and 55 before supper  Invokana 100mg  daily  Check blood sugars on waking up   times a week Also check blood sugars about 2 hours after a meal and do this after different meals by rotation  Recommended blood sugar levels on waking up is 90-130 and about 2 hours after meal is 130-160  Please bring your blood sugar monitor to each visit, thank you  Metformin 2x daily

## 2015-12-03 NOTE — Progress Notes (Signed)
Patient ID: Destiny Tucker, female   DOB: Mar 10, 1956, 60 y.o.   MRN: [id number]   Reason for Appointment: Diabetes follow-up   History of Present Illness   Diagnosis: Type 2 DIABETES MELITUS, date of diagnosis:  08/2010     Previous history: She has been on various regimens for her diabetes including Byetta in the past. Because of progressively higher fasting readings she was started on Levemir insulin in 2013 She usually tends to have relatively high fasting readings  Recent history:   Insulin regimen:  Humalog 12-14  before meals, Levemir 16 in am and 46 units at night       Although her A1c was at 6.8 on the last visit her blood sugars are significantly higher over the last month No recent labs available She does not know why although she did have some respiratory infection in December  Current blood sugar patterns and problems:  She is still taking relatively large doses of Levemir in the evening but her FASTING blood sugars are mostly over 200 now  She checks blood sugars only sporadically a relatively higher     She still continues to take Victoza butnot clear if she is benefiting  Still not able to lose much weight and has lost only 4 pounds  Has limited activity level, partly related to knee pain but also does not feel motivated  She usually does not check blood sugars after her first meal of the day and not clear if she is taking enough mealtime coverage both for this and suppertime  Still takes metformin only at bedtime even though she was told to try taking this twice a day     Oral hypoglycemic drugs: Metformin 1g hs      Side effects from medications: None       Monitors blood glucose:  1-2 times a day.    Glucometer: One Touch          Blood Glucose readings from meter:  Mean values apply above for all meters except median for One Touch  PRE-MEAL Fasting Lunch Dinner Bedtime Overall  Glucose range:  186-228    275, 256   37-347    Mean/median:   207     210     Hypoglycemia:  none recently  Meals: 3 meals per day: Breakfast at 1pm; dinner 7-8 pm; snacks at times       Physical activity: active at work             Dietician visit: Most recent: At diagnosis and with nurse educator periodically      Weight control:  Wt Readings from Last 3 Encounters:  12/03/15 240 lb (108.863 kg)  09/28/15 244 lb 12.8 oz (111.041 kg)  09/15/15 243 lb 3.2 oz (110.315 kg)         Diabetes labs:  Lab Results  Component Value Date   HGBA1C 6.8* 09/13/2015   HGBA1C 6.9* 03/15/2015   HGBA1C 7.1* 10/23/2014   Lab Results  Component Value Date   MICROALBUR 0.3 10/01/2013   LDLCALC 46 06/16/2014   CREATININE 0.51 09/13/2015      Medication List       This list is accurate as of: 12/03/15 11:59 PM.  Always use your most recent med list.               ASPIR-LOW 81 MG EC tablet  Generic drug:  aspirin  TAKE 1 TABLET BY MOUTH ONCE DAILY  B-D SINGLE USE SWABS REGULAR Pads  USE AS DIRECTED TWICE DAILY     canagliflozin 100 MG Tabs tablet  Commonly known as:  INVOKANA  Take 1 tablet (100 mg total) by mouth daily before breakfast.     clonazePAM 1 MG tablet  Commonly known as:  KLONOPIN  TAKE ONE TABLET BY MOUTH TWICE DAILY AS NEEDED FOR ANXIETY     diphenhydrAMINE 25 MG tablet  Commonly known as:  BENADRYL  Take 25 mg by mouth every 6 (six) hours as needed.     glimepiride 4 MG tablet  Commonly known as:  AMARYL  Take 1 tablet (4 mg total) by mouth daily with breakfast.     glucose blood test strip  Use to test up to 3 times daily     Insulin Detemir 100 UNIT/ML Pen  Commonly known as:  LEVEMIR  Inject 20-44 Units into the skin 2 (two) times daily. Takes 20 in am and 44 units at night     insulin regular human CONCENTRATED 500 UNIT/ML kwikpen  Commonly known as:  HUMULIN R  45 units every morning and 55 units every evening     Insulin Syringe-Needle U-100 31G X 5/16" 0.3 ML Misc  Commonly known as:  B-D INSULIN  SYRINGE  Use 2 syringes daily with Levemir Insulin     lovastatin 20 MG tablet  Commonly known as:  MEVACOR  TAKE ONE TABLET BY MOUTH AT BEDTIME     metFORMIN 1000 MG tablet  Commonly known as:  GLUCOPHAGE  Take 1 tablet (1,000 mg total) by mouth 2 (two) times daily with a meal.     multivitamin with minerals tablet  Take 1 tablet by mouth daily.     naproxen 500 MG tablet  Commonly known as:  NAPROSYN  Take 1 tablet (500 mg total) by mouth 2 (two) times daily with a meal.     NOVOFINE 32G X 6 MM Misc  Generic drug:  Insulin Pen Needle  USE 3 TIMES A DAY     omega-3 fish oil 1000 MG Caps capsule  Commonly known as:  MAXEPA  Take by mouth.     ONETOUCH DELICA LANCETS Misc  Use up to three times a day     ONETOUCH DELICA LANCETS 99991111 Misc  USE TO TEST UP TO 3 TIMES DAILY     sertraline 100 MG tablet  Commonly known as:  ZOLOFT  TAKE ONE TABLET BY MOUTH ONCE DAILY     triamterene-hydrochlorothiazide 37.5-25 MG capsule  Commonly known as:  DYAZIDE  Take 1 each (1 capsule total) by mouth daily.     triamterene-hydrochlorothiazide 37.5-25 MG tablet  Commonly known as:  MAXZIDE-25  Take 1 tablet by mouth daily. Must estab w/new provider for future refills     TURMERIC PO  Take by mouth.     VICTOZA 18 MG/3ML Sopn  Generic drug:  Liraglutide  Inject 1.2 Units into the skin daily. Plus 3 clicks        Allergies:  Allergies  Allergen Reactions  . Codeine     Past Medical History  Diagnosis Date  . OSTEOPENIA   . TOBACCO USE, QUIT   . HYPERTENSION   . HYPERLIPIDEMIA   . DEPRESSION   . ANXIETY   . ALLERGIC RHINITIS   . Diabetes mellitus ([place])   . Internal hemorrhoids without mention of complication 123456    Colonoscopy--Dr. Fuller Plan , pts. states hemorrhoids are uncomfortable    Past Surgical History  Procedure Laterality Date  . Unilateral salpingo-oophorectomy    . Mohs surgery      Precancerous skin lesion  . Removal of fallopion tube & ovary   1986  . Removal of cyst & other ovary  09/2004  . Anus surgery      Family History  Problem Relation Age of Onset  . Hypertension Mother   . Hyperlipidemia Mother   . Nephrolithiasis Mother   . Hypertension Father   . Hyperlipidemia Father   . Nephrolithiasis Father   . Diabetes Maternal Uncle   . Diabetes Mother     Social History:  reports that she quit smoking about 5 years ago. She does not have any smokeless tobacco history on file. She reports that she drinks alcohol. She reports that she does not use illicit drugs.  Review of Systems:  Hypertension:  mild and treated with only Dyazide 25  Lipids: She is only on 20 mg of lovastatin with good control, needs follow-up labs   Lab Results  Component Value Date   CHOL 114 06/16/2014   HDL 31.30* 06/16/2014   LDLCALC 46 06/16/2014   LDLDIRECT 68.7 10/01/2013   TRIG 184.0* 06/16/2014   CHOLHDL 4 06/16/2014    No recent symptoms of numbness in feet      LABS:  No visits with results within 1 Week(s) from this visit. Latest known visit with results is:  Documentation on 09/17/2015  Component Date Value Ref Range Status  . HM Mammogram 09/10/2015 Completed and normal @ [facility]   Final     Examination:   BP 118/70 mmHg  Temp(Src) 97.8 F (36.6 C) (Oral)  Ht 5\' 4"  (1.626 m)  Wt 240 lb (108.863 kg)  BMI 41.18 kg/m2  Body mass index is 41.18 kg/(m^2).   1+ ankle edema especially on left  Diabetic Foot Exam - Simple   Simple Foot Form  Diabetic Foot exam was performed with the following findings:  Yes 12/03/2015  4:00 PM  Visual Inspection  No deformities, no ulcerations, no other skin breakdown bilaterally:  Yes  Sensation Testing  Intact to touch and monofilament testing bilaterally:  Yes  See comments:  Yes  Pulse Check  Posterior Tibialis and Dorsalis pulse intact bilaterally:  Yes  Comments  Reduced monofilament sensation in the toes, mostly on the fifth toes bilaterally      ASSESSMENT/ PLAN:    Diabetes type 2   Blood glucose control is worse with mostly readings over 200 now at home See history of present illness for detailed discussion of his current management, blood sugar patterns and problems identified She appears to have regressed insulin deficiency and is also insulin resistant with taking about 90 units of insulin a day without adequate control Again fasting readings have been consistently high but probably has relatively higher postprandial readings also which she does not monitor much She is not exercising and not able to lose weight consistently Not clear if she is benefiting from Amaryl  She thinks she may have taken Invokana previously at some point and had candidiasis but cannot find a record of prescription on her record.  She is also very reluctant to take this because of news media publicity about side effects  Discussed action of SGLT 2 drugs on lowering glucose by decreasing kidney absorption of glucose, benefits of weight loss and lower blood pressure, possible side effects including candidiasis and dosage regimen  She will start Invokana 100 mg daily At the same time she'll reduce her  Maxzide to half a tablet, discussed that she should have less edema with Invokana  INSULIN: Since she is requiring large doses of insulin will give her a trial of U-500 insulin twice a day, discussed how this will work and how to adjust the dose in steps of 5 units if needed to get fasting readings at target.  Showed her the demonstration pen and given her a brochure and free co-pay card for this She will try to apply to the company to get financial assistance for this Needs short-term follow-up Increase metformin to twice a day also May stop Victoza when finished    Patient Instructions  Humulin U-500 insulin 45 units before Bfst and 55 before supper  Invokana 100mg  daily  Check blood sugars on waking up   times a week Also check blood sugars about 2 hours after a meal  and do this after different meals by rotation  Recommended blood sugar levels on waking up is 90-130 and about 2 hours after meal is 130-160  Please bring your blood sugar monitor to each visit, thank you  Metformin 2x daily   Counseling time on subjects discussed above is over 50% of today's 25 minute visit   Jaonna Word 12/04/2015, 11:25 AM

## 2015-12-06 ENCOUNTER — Telehealth: Payer: Self-pay | Admitting: Endocrinology

## 2015-12-06 NOTE — Telephone Encounter (Signed)
Please see below and advise.

## 2015-12-06 NOTE — Telephone Encounter (Signed)
Patient stated that medication Invokana and Humulin U 500 wasn't working with discount cards, please advie

## 2015-12-06 NOTE — Telephone Encounter (Signed)
U-500 insulin can be obtained from [place].  I gave her a coupon for 2 free pens She can check with insurance about coverage for Jardiance and Farxiga

## 2015-12-07 NOTE — Telephone Encounter (Signed)
Phoned her saying she can get her insulin, and that the prescription has been called in.  She can use the coupon for 2 free pens and to call the company for financial assistance from.  She voiced understanding of this.

## 2015-12-07 NOTE — Telephone Encounter (Signed)
For you -

## 2015-12-07 NOTE — Telephone Encounter (Signed)
Destiny Tucker,   Dr. Dwyane Dee said for her to get an application from [place] for her Humulin U-500, the coupon he gave her should work for the free two pens.  Can you please call her and let her know.

## 2015-12-07 NOTE — Telephone Encounter (Signed)
Message left on my machine from the patient saying that the pharmacy says the need a preauth.  from the doctors office before they can fill the U-500 insulin.

## 2015-12-10 NOTE — Telephone Encounter (Signed)
RU 500 humulin was able to be covered

## 2015-12-13 ENCOUNTER — Telehealth: Payer: Self-pay | Admitting: Family

## 2015-12-13 NOTE — Telephone Encounter (Signed)
Pt seen Dr. Dwyane Dee recently and he put her on Invokonin and changed her insulin. She wanted to be sure [name] knew about this.

## 2015-12-15 NOTE — Telephone Encounter (Signed)
error 

## 2015-12-16 NOTE — Telephone Encounter (Signed)
Noted thank you

## 2015-12-24 ENCOUNTER — Telehealth: Payer: Self-pay | Admitting: Endocrinology

## 2015-12-24 NOTE — Telephone Encounter (Signed)
Dawn, can you let her know please, also, she needs to re do her applications.  Since she does have health insurance and she's working, both of those need to be noted on both Applications, along with proof of her income along with her husbands.

## 2015-12-24 NOTE — Telephone Encounter (Signed)
Please see below and advise.

## 2015-12-24 NOTE — Telephone Encounter (Signed)
Pt has itching in her genital region thinks its due to invokana

## 2015-12-24 NOTE — Telephone Encounter (Signed)
If she is only having itching and no discharge she can use OTC Generic Monistat

## 2016-01-06 ENCOUNTER — Encounter: Payer: Self-pay | Admitting: Endocrinology

## 2016-01-07 ENCOUNTER — Ambulatory Visit: Payer: BLUE CROSS/BLUE SHIELD | Admitting: Family

## 2016-01-10 ENCOUNTER — Other Ambulatory Visit (INDEPENDENT_AMBULATORY_CARE_PROVIDER_SITE_OTHER): Payer: BLUE CROSS/BLUE SHIELD

## 2016-01-10 DIAGNOSIS — E1165 Type 2 diabetes mellitus with hyperglycemia: Secondary | ICD-10-CM

## 2016-01-10 DIAGNOSIS — Z794 Long term (current) use of insulin: Secondary | ICD-10-CM | POA: Diagnosis not present

## 2016-01-10 LAB — LIPID PANEL
Cholesterol: 121 mg/dL (ref 0–200)
HDL: 32.3 mg/dL — AB (ref >39.00)
LDL Cholesterol: 59 mg/dL (ref 0–99)
NONHDL: 88.59
TRIGLYCERIDES: 148 mg/dL (ref 0.0–149.0)
Total CHOL/HDL Ratio: 4
VLDL: 29.6 mg/dL (ref 0.0–40.0)

## 2016-01-10 LAB — COMPREHENSIVE METABOLIC PANEL
ALT: 24 U/L (ref 0–35)
AST: 20 U/L (ref 0–37)
Albumin: 4.3 g/dL (ref 3.5–5.2)
Alkaline Phosphatase: 91 U/L (ref 39–117)
BILIRUBIN TOTAL: 0.3 mg/dL (ref 0.2–1.2)
BUN: 13 mg/dL (ref 6–23)
CHLORIDE: 104 meq/L (ref 96–112)
CO2: 27 meq/L (ref 19–32)
CREATININE: 0.62 mg/dL (ref 0.40–1.20)
Calcium: 9.6 mg/dL (ref 8.4–10.5)
GFR: 104.01 mL/min (ref >60.00)
GLUCOSE: 213 mg/dL — AB (ref 70–99)
Potassium: 3.7 mEq/L (ref 3.5–5.1)
Sodium: 138 mEq/L (ref 135–145)
Total Protein: 7.5 g/dL (ref 6.0–8.3)

## 2016-01-10 LAB — MICROALBUMIN / CREATININE URINE RATIO
CREATININE, U: 43.7 mg/dL
MICROALB/CREAT RATIO: 4.3 mg/g (ref 0.0–30.0)
Microalb, Ur: 1.9 mg/dL (ref 0.0–1.9)

## 2016-01-10 LAB — URINALYSIS, ROUTINE W REFLEX MICROSCOPIC
Bilirubin Urine: NEGATIVE
Ketones, ur: NEGATIVE
LEUKOCYTES UA: NEGATIVE
NITRITE: NEGATIVE
SPECIFIC GRAVITY, URINE: 1.01 (ref 1.000–1.030)
Total Protein, Urine: NEGATIVE
Urobilinogen, UA: 0.2 (ref 0.0–1.0)
pH: 6 (ref 5.0–8.0)

## 2016-01-10 LAB — HEMOGLOBIN A1C: HEMOGLOBIN A1C: 6.9 % — AB (ref 4.6–6.5)

## 2016-01-13 ENCOUNTER — Ambulatory Visit (INDEPENDENT_AMBULATORY_CARE_PROVIDER_SITE_OTHER): Payer: BLUE CROSS/BLUE SHIELD | Admitting: Endocrinology

## 2016-01-13 ENCOUNTER — Encounter: Payer: Self-pay | Admitting: Endocrinology

## 2016-01-13 VITALS — BP 122/78 | HR 88 | Ht 64.0 in | Wt 242.0 lb

## 2016-01-13 DIAGNOSIS — E1165 Type 2 diabetes mellitus with hyperglycemia: Secondary | ICD-10-CM

## 2016-01-13 DIAGNOSIS — Z794 Long term (current) use of insulin: Secondary | ICD-10-CM | POA: Diagnosis not present

## 2016-01-13 MED ORDER — FLUCONAZOLE 150 MG PO TABS: 150.0000 mg | ORAL_TABLET | Freq: Once | ORAL | Status: DC

## 2016-01-13 NOTE — Progress Notes (Signed)
Patient ID: Destiny Tucker, female   DOB: 05/23/1956, 60 y.o.   MRN: [id number]   Reason for Appointment: Diabetes follow-up   History of Present Illness   Diagnosis: Type 2 DIABETES MELITUS, date of diagnosis:  08/2010     Previous history: She has been on various regimens for her diabetes including Byetta in the past. Because of progressively higher fasting readings she was started on Levemir insulin in 2013 She usually tends to have relatively high fasting readings  Recent history:   Insulin regimen:  Humulin R U-500, 45--55  Although her A1c  is around 6.8% her blood sugars previously had been significantly high and averaging over 200 especially in the morning Because of poor control with Levemir and Humalog she was started on Humulin R U-500 in 1/17 She was also started on Invokana 100 mg daily which she thinks she started around the same day  Current blood sugar patterns and problems:  She is starting to see improvement in her fasting blood sugars although they are still relatively inconsistent  More recently has not done any readings in the afternoons and evenings despite reminders  She has a couple of readings in the late afternoon which were fairly good compared to the morning readings  Has only 2 readings late at night which were high  She has difficulty being consistent with her diet, sometimes eating sweets like ice cream and she thinks her fasting glucose of 192 today was because of going off her diet last night  Still takes metformin only at bedtime even though she was told to try taking this twice a day, she thinks she may get abdominal discomfort with twice a day  Although she has been taking Invokana she has not lost any weight.  She now thinks that she is having some vaginal itching periodically not completely relieved by OTC creams      Oral hypoglycemic drugs: Metformin 1g hs      Side effects from medications: None       Monitors blood glucose:   1-2 times a day.    Glucometer: One Touch          Blood Glucose readings from meter:  Mean values apply above for all meters except median for One Touch  PRE-MEAL Fasting Lunch Dinner Bedtime Overall  Glucose range:  125-192    143   101-199    Mean/median:      157    breakfast 11 AM, dinner 7-8 pm; snacks at times       Physical activity:  somewhat active at work             Dietician visit: Most recent: At diagnosis and with nurse educator periodically      Weight control:  Wt Readings from Last 3 Encounters:  01/13/16 242 lb (109.77 kg)  12/03/15 240 lb (108.863 kg)  09/28/15 244 lb 12.8 oz (111.041 kg)         Diabetes labs:  Lab Results  Component Value Date   HGBA1C 6.9* 01/10/2016   HGBA1C 6.8* 09/13/2015   HGBA1C 6.9* 03/15/2015   Lab Results  Component Value Date   MICROALBUR 1.9 01/10/2016   LDLCALC 59 01/10/2016   CREATININE 0.62 01/10/2016      Medication List       This list is accurate as of: 01/13/16 11:59 PM.  Always use your most recent med list.  ASPIR-LOW 81 MG EC tablet  Generic drug:  aspirin  TAKE 1 TABLET BY MOUTH ONCE DAILY     B-D SINGLE USE SWABS REGULAR Pads  USE AS DIRECTED TWICE DAILY     canagliflozin 100 MG Tabs tablet  Commonly known as:  INVOKANA  Take 1 tablet (100 mg total) by mouth daily before breakfast.     clonazePAM 1 MG tablet  Commonly known as:  KLONOPIN  TAKE ONE TABLET BY MOUTH TWICE DAILY AS NEEDED FOR ANXIETY     diphenhydrAMINE 25 MG tablet  Commonly known as:  BENADRYL  Take 25 mg by mouth every 6 (six) hours as needed.     fluconazole 150 MG tablet  Commonly known as:  DIFLUCAN  Take 1 tablet (150 mg total) by mouth once.     glucose blood test strip  Use to test up to 3 times daily     Insulin Detemir 100 UNIT/ML Pen  Commonly known as:  LEVEMIR  Inject 20-44 Units into the skin 2 (two) times daily. Reported on 01/13/2016     insulin regular human CONCENTRATED 500 UNIT/ML  kwikpen  Commonly known as:  HUMULIN R  45 units every morning and 55 units every evening     Insulin Syringe-Needle U-100 31G X 5/16" 0.3 ML Misc  Commonly known as:  B-D INSULIN SYRINGE  Use 2 syringes daily with Levemir Insulin     lovastatin 20 MG tablet  Commonly known as:  MEVACOR  TAKE ONE TABLET BY MOUTH AT BEDTIME     metFORMIN 1000 MG tablet  Commonly known as:  GLUCOPHAGE  Take 1 tablet (1,000 mg total) by mouth 2 (two) times daily with a meal.     multivitamin with minerals tablet  Take 1 tablet by mouth daily.     naproxen 500 MG tablet  Commonly known as:  NAPROSYN  Take 1 tablet (500 mg total) by mouth 2 (two) times daily with a meal.     NOVOFINE 32G X 6 MM Misc  Generic drug:  Insulin Pen Needle  USE 3 TIMES A DAY     omega-3 fish oil 1000 MG Caps capsule  Commonly known as:  MAXEPA  Take by mouth.     ONETOUCH DELICA LANCETS Misc  Use up to three times a day     ONETOUCH DELICA LANCETS 99991111 Misc  USE TO TEST UP TO 3 TIMES DAILY     sertraline 100 MG tablet  Commonly known as:  ZOLOFT  TAKE ONE TABLET BY MOUTH ONCE DAILY     triamterene-hydrochlorothiazide 37.5-25 MG capsule  Commonly known as:  DYAZIDE  Take 1 each (1 capsule total) by mouth daily.     triamterene-hydrochlorothiazide 37.5-25 MG tablet  Commonly known as:  MAXZIDE-25  Take 1 tablet by mouth daily. Must estab w/new provider for future refills     TURMERIC PO  Take by mouth.        Allergies:  Allergies  Allergen Reactions  . Codeine     Past Medical History  Diagnosis Date  . OSTEOPENIA   . TOBACCO USE, QUIT   . HYPERTENSION   . HYPERLIPIDEMIA   . DEPRESSION   . ANXIETY   . ALLERGIC RHINITIS   . Diabetes mellitus ([place])   . Internal hemorrhoids without mention of complication 123456    Colonoscopy--Dr. Fuller Plan , pts. states hemorrhoids are uncomfortable    Past Surgical History  Procedure Laterality Date  . Unilateral salpingo-oophorectomy    .  Mohs surgery       Precancerous skin lesion  . Removal of fallopion tube & ovary  1986  . Removal of cyst & other ovary  09/2004  . Anus surgery      Family History  Problem Relation Age of Onset  . Hypertension Mother   . Hyperlipidemia Mother   . Nephrolithiasis Mother   . Hypertension Father   . Hyperlipidemia Father   . Nephrolithiasis Father   . Diabetes Maternal Uncle   . Diabetes Mother     Social History:  reports that she quit smoking about 5 years ago. She does not have any smokeless tobacco history on file. She reports that she drinks alcohol. She reports that she does not use illicit drugs.  Review of Systems:  Hypertension:  mild and treated with Dyazide 25 and she was told to take this only a half tablet with starting Invokana She also has tendency to edema  Lipids: She is  on 20 mg  mg of lovastatin with good control   Lab Results  Component Value Date   CHOL 121 01/10/2016   HDL 32.30* 01/10/2016   LDLCALC 59 01/10/2016   LDLDIRECT 68.7 10/01/2013   TRIG 148.0 01/10/2016   CHOLHDL 4 01/10/2016         LABS:  Lab on 01/10/2016  Component Date Value Ref Range Status  . Hgb A1c MFr Bld 01/10/2016 6.9* 4.6 - 6.5 % Final   Glycemic Control Guidelines for People with Diabetes:Non Diabetic:  <6%Goal of Therapy: <7%Additional Action Suggested:  >8%   . Sodium 01/10/2016 138  135 - 145 mEq/L Final  . Potassium 01/10/2016 3.7  3.5 - 5.1 mEq/L Final  . Chloride 01/10/2016 104  96 - 112 mEq/L Final  . CO2 01/10/2016 27  19 - 32 mEq/L Final  . Glucose, Bld 01/10/2016 213* 70 - 99 mg/dL Final  . BUN 01/10/2016 13  6 - 23 mg/dL Final  . Creatinine, Ser 01/10/2016 0.62  0.40 - 1.20 mg/dL Final  . Total Bilirubin 01/10/2016 0.3  0.2 - 1.2 mg/dL Final  . Alkaline Phosphatase 01/10/2016 91  39 - 117 U/L Final  . AST 01/10/2016 20  0 - 37 U/L Final  . ALT 01/10/2016 24  0 - 35 U/L Final  . Total Protein 01/10/2016 7.5  6.0 - 8.3 g/dL Final  . Albumin 01/10/2016 4.3  3.5 - 5.2  g/dL Final  . Calcium 01/10/2016 9.6  8.4 - 10.5 mg/dL Final  . GFR 01/10/2016 104.01  >60.00 mL/min Final  . Microalb, Ur 01/10/2016 1.9  0.0 - 1.9 mg/dL Final  . Creatinine,U 01/10/2016 43.7   Final  . Microalb Creat Ratio 01/10/2016 4.3  0.0 - 30.0 mg/g Final  . Cholesterol 01/10/2016 121  0 - 200 mg/dL Final   ATP III Classification       Desirable:  < 200 mg/dL               Borderline High:  200 - 239 mg/dL          High:  > = 240 mg/dL  . Triglycerides 01/10/2016 148.0  0.0 - 149.0 mg/dL Final   Normal:  <150 mg/dLBorderline High:  150 - 199 mg/dL  . HDL 01/10/2016 32.30* >39.00 mg/dL Final  . VLDL 01/10/2016 29.6  0.0 - 40.0 mg/dL Final  . LDL Cholesterol 01/10/2016 59  0 - 99 mg/dL Final  . Total CHOL/HDL Ratio 01/10/2016 4   Final  Men          Women1/2 Average Risk     3.4          3.3Average Risk          5.0          4.42X Average Risk          9.6          7.13X Average Risk          15.0          11.0                      . NonHDL 01/10/2016 88.59   Final   NOTE:  Non-HDL goal should be 30 mg/dL higher than patient's LDL goal (i.e. LDL goal of < 70 mg/dL, would have non-HDL goal of < 100 mg/dL)  . Color, Urine 01/10/2016 YELLOW  Yellow;Lt. Yellow Final  . APPearance 01/10/2016 CLEAR  Clear Final  . Specific Gravity, Urine 01/10/2016 1.010  1.000-1.030 Final  . pH 01/10/2016 6.0  5.0 - 8.0 Final  . Total Protein, Urine 01/10/2016 NEGATIVE  Negative Final  . Urine Glucose 01/10/2016 >=1000* Negative Final  . Ketones, ur 01/10/2016 NEGATIVE  Negative Final  . Bilirubin Urine 01/10/2016 NEGATIVE  Negative Final  . Hgb urine dipstick 01/10/2016 TRACE-INTACT* Negative Final  . Urobilinogen, UA 01/10/2016 0.2  0.0 - 1.0 Final  . Leukocytes, UA 01/10/2016 NEGATIVE  Negative Final  . Nitrite 01/10/2016 NEGATIVE  Negative Final  . WBC, UA 01/10/2016 0-2/hpf  0-2/hpf Final  . RBC / HPF 01/10/2016 0-2/hpf  0-2/hpf Final     Examination:   BP 122/78 mmHg  Pulse  88  Ht 5\' 4"  (1.626 m)  Wt 242 lb (109.77 kg)  BMI 41.52 kg/m2  SpO2 97%  Body mass index is 41.52 kg/(m^2).    ASSESSMENT/ PLAN:   Diabetes type 2 , BMI 41    See history of present illness for detailed discussion of his current management, blood sugar patterns and problems identified She appears to have significant improvement in her glucose control with regarding U-500 insulin instead of Humalog and Levemir. Also may be benefiting from [facility] although she started both at the same time She has been compliant with this and despite her reluctance to pay for her insulin she is somewhat willing to continue this even with a $25 co-pay that was provided Explained to her that her blood sugars have been difficult to control and will need to use the most effective insulin that works for her  She also is needing to check her blood sugars more consistently after meals to help adjust her insulin dose Most likely will need at least 5 more units in the evening with fasting readings being mostly continuing to be higher and a couple of readings high after supper also; she tends to have high fasting readings anyway  Encouraged her to stay on [place] and will give her Diflucan to treat her candidiasis Discussed needing to be consistent with diet and exercise She will try to take metformin twice a day if she can tolerate this     Patient Instructions  Check blood sugars on waking up 4-5  times a week  Also check blood sugars about 2 hours after a meal and do this after different meals by rotation  Recommended blood sugar levels on waking up is 90-130 and about 2 hours after meal is 130-160  At least some testing  coming home and bedtime  Please bring your blood sugar monitor to each visit, thank you  Watch diet better  PM dose 60 units    Counseling time on subjects discussed above is over 50% of today's 25 minute visit   Damisha Wolff 01/14/2016, 7:47 AM

## 2016-01-13 NOTE — Patient Instructions (Addendum)
Check blood sugars on waking up 4-5  times a week  Also check blood sugars about 2 hours after a meal and do this after different meals by rotation  Recommended blood sugar levels on waking up is 90-130 and about 2 hours after meal is 130-160  At least some testing coming home and bedtime  Please bring your blood sugar monitor to each visit, thank you  Watch diet better  PM dose 60 units

## 2016-01-21 ENCOUNTER — Telehealth: Payer: Self-pay | Admitting: *Deleted

## 2016-01-21 NOTE — Telephone Encounter (Signed)
Left msg on triage yesterday afternoon stating needing Destiny Tucker to order xray of (L) knee. Her knee is actually getting worse. Seen sport medicine MD had injection in it, and do not want another one. Called pt back inform her will need f/u appt b4 xray can be ordered. Made aap for next Friday per pt request.../lmb

## 2016-01-25 ENCOUNTER — Other Ambulatory Visit: Payer: Self-pay | Admitting: Family

## 2016-01-28 ENCOUNTER — Ambulatory Visit (INDEPENDENT_AMBULATORY_CARE_PROVIDER_SITE_OTHER): Payer: BLUE CROSS/BLUE SHIELD | Admitting: Family

## 2016-01-28 ENCOUNTER — Encounter: Payer: Self-pay | Admitting: Family

## 2016-01-28 VITALS — BP 128/80 | HR 88 | Temp 98.0°F | Ht 64.0 in | Wt 244.0 lb

## 2016-01-28 DIAGNOSIS — M7061 Trochanteric bursitis, right hip: Secondary | ICD-10-CM

## 2016-01-28 DIAGNOSIS — M25562 Pain in left knee: Secondary | ICD-10-CM

## 2016-01-28 DIAGNOSIS — J309 Allergic rhinitis, unspecified: Secondary | ICD-10-CM

## 2016-01-28 DIAGNOSIS — M25569 Pain in unspecified knee: Secondary | ICD-10-CM | POA: Insufficient documentation

## 2016-01-28 MED ORDER — MONTELUKAST SODIUM 10 MG PO TABS: 10.0000 mg | ORAL_TABLET | Freq: Every day | ORAL | Status: DC

## 2016-01-28 MED ORDER — DICLOFENAC SODIUM 2 % TD SOLN: 1.0000 "application " | Freq: Two times a day (BID) | TRANSDERMAL | Status: DC | PRN

## 2016-01-28 MED ORDER — IBUPROFEN-FAMOTIDINE 800-26.6 MG PO TABS: 1.0000 | ORAL_TABLET | Freq: Three times a day (TID) | ORAL | Status: DC | PRN

## 2016-01-28 NOTE — Assessment & Plan Note (Signed)
Symptoms and exam consistent with patellar tendinitis/patellofemoral syndrome. There is also possibility of osteoarthritis given her current weight. Treat conservatively with ice and home therapy. Start Pennsaid and Duexis. Encouraged weight loss to help decrease symptoms. Follow up if symptoms do not improve for imaging or possible cortisone injection.

## 2016-01-28 NOTE — Assessment & Plan Note (Signed)
Symptoms and exam consistent with allergic rhinitis refractory to second-generation antihistamines. Recommend inhaled nasal corticosteroids and start Singulair. If symptoms worsen or fail to improve consider prednisone or possible allergy testing.

## 2016-01-28 NOTE — Assessment & Plan Note (Signed)
Symptoms and exam consistent with greater trochanteric bursitis. Treat conservatively with ice and home exercise therapy. Start Pennsaid and Duexis. Encouraged exercise and weight loss to help decrease her symptoms. Follow up if symptoms do not improve for further imaging and treatment.

## 2016-01-28 NOTE — Progress Notes (Signed)
Pre visit review using our clinic review tool, if applicable. No additional management support is needed unless otherwise documented below in the visit note. 

## 2016-01-28 NOTE — Progress Notes (Signed)
Subjective:    Patient ID: Destiny Tucker, female    DOB: December 23, 1955, 60 y.o.   MRN: [id number]  Chief Complaint  Patient presents with  . Follow-up    left knee    HPI:  Destiny Tucker is a 60 y.o. female who  has a past medical history of OSTEOPENIA; TOBACCO USE, QUIT; HYPERTENSION; HYPERLIPIDEMIA; DEPRESSION; ANXIETY; ALLERGIC RHINITIS; Diabetes mellitus ([place]); and Internal hemorrhoids without mention of complication (123456). and presents today for a follow up office visit.   1.) Left knee - Associated symptom of pain located in her left knee has been going on for . Modifying factors include Advil which does help with her pain. Describes feelings of her knee being stuck and feels like it is locked on occasion. Pain is described as achy. Denies any specific trauma. She is not able to do the necessary physical work to complete her job.   2.) Hip pain - Associated symptom of pain located in her right hip has been going on for several weeks. Denies any trauma. Pain is described as inconsistent and aggravated by steps and does not have the ability to cross her right leg over her left knee. Modifying factors include ibuprofen.   3.) Seasonal allergies - Associated symptoms of rhinorrhea, itchy and watery eyes and sneezing has been going on for several days and has been refractory to over the counter second generation antihistamines. No fevers. No recent antibiotic use.   Allergies  Allergen Reactions  . Codeine      Current Outpatient Prescriptions on File Prior to Visit  Medication Sig Dispense Refill  . Alcohol Swabs (B-D SINGLE USE SWABS REGULAR) PADS USE AS DIRECTED TWICE DAILY 100 each 9  . ASPIR-LOW 81 MG EC tablet TAKE 1 TABLET BY MOUTH ONCE DAILY 90 tablet 3  . canagliflozin (INVOKANA) 100 MG TABS tablet Take 1 tablet (100 mg total) by mouth daily before breakfast. 30 tablet 3  . clonazePAM (KLONOPIN) 1 MG tablet TAKE ONE TABLET BY MOUTH TWICE DAILY AS NEEDED FOR ANXIETY  180 tablet 0  . diphenhydrAMINE (BENADRYL) 25 MG tablet Take 25 mg by mouth every 6 (six) hours as needed.    [name] glucose blood test strip Use to test up to 3 times daily 300 each 1  . insulin regular human CONCENTRATED (HUMULIN R) 500 UNIT/ML kwikpen 45 units every morning and 55 units every evening 2 pen 3  . Insulin Syringe-Needle U-100 (B-D INSULIN SYRINGE) 31G X 5/16" 0.3 ML MISC Use 2 syringes daily with Levemir Insulin 100 each 3  . lovastatin (MEVACOR) 20 MG tablet TAKE ONE TABLET BY MOUTH AT BEDTIME 90 tablet 1  . metFORMIN (GLUCOPHAGE) 1000 MG tablet Take 1 tablet (1,000 mg total) by mouth 2 (two) times daily with a meal. 180 tablet 0  . Multiple Vitamins-Minerals (MULTIVITAMIN WITH MINERALS) tablet Take 1 tablet by mouth daily.    [name] NOVOFINE 32G X 6 MM MISC USE 3 TIMES A DAY 300 each 1  . omega-3 fish oil (MAXEPA) 1000 MG CAPS capsule Take by mouth.    [name] DELICA LANCETS [id number] MISC USE TO TEST UP TO 3 TIMES DAILY 300 each 1  . sertraline (ZOLOFT) 100 MG tablet TAKE ONE TABLET BY MOUTH ONCE DAILY 90 tablet 0  . TURMERIC PO Take by mouth.     No current facility-administered medications on file prior to visit.     Past Surgical History  Procedure Laterality Date  . Unilateral salpingo-oophorectomy    .  Mohs surgery      Precancerous skin lesion  . Removal of fallopion tube & ovary  1986  . Removal of cyst & other ovary  09/2004  . Anus surgery      Past Medical History  Diagnosis Date  . OSTEOPENIA   . TOBACCO USE, QUIT   . HYPERTENSION   . HYPERLIPIDEMIA   . DEPRESSION   . ANXIETY   . ALLERGIC RHINITIS   . Diabetes mellitus ([place])   . Internal hemorrhoids without mention of complication [id number]    Colonoscopy--Dr. Fuller Plan , pts. states hemorrhoids are uncomfortable     Review of Systems  Constitutional: Negative for fever and chills.  HENT: Positive for rhinorrhea. Negative for congestion.   Eyes: Positive for itching.  Respiratory: Negative for chest  tightness and shortness of breath.   Musculoskeletal:       Positive for right hip and left shoulder pain.   Neurological: Negative for weakness and numbness.      Objective:    BP 128/80 mmHg  Pulse 88  Temp(Src) 98 F (36.7 C) (Oral)  Ht 5\' 4"  (1.626 m)  Wt 244 lb (110.678 kg)  BMI 41.86 kg/m2  SpO2 93% Nursing note and vital signs reviewed.  Physical Exam  Constitutional: She is oriented to person, place, and time. She appears well-developed and well-nourished. No distress.  HENT:  Right Ear: Hearing, tympanic membrane, external ear and ear canal normal.  Left Ear: Hearing, tympanic membrane, external ear and ear canal normal.  Nose: Nose normal.  Mouth/Throat: Uvula is midline, oropharynx is clear and moist and mucous membranes are normal.  Cardiovascular: Normal rate, regular rhythm, normal heart sounds and intact distal pulses.   Pulmonary/Chest: Effort normal and breath sounds normal.  Musculoskeletal:  Right hip - no obvious deformity, discoloration, or edema. Tenderness elicited along greater trochanter. Range of motion with restriction of external rotation secondary to discomfort. Strength is 4+ in all directions. Distal pulses and sensation are intact and appropriate. Negative hip scouring .  Left knee - no obvious deformity, discoloration, or edema. Mild anterior tenderness or patellar tendon and anterior joint line. Range of motion within normal limits and strength is 4+. Distal pulses and sensation are intact and appropriate. Negative ligamentous and meniscal testing.  Neurological: She is alert and oriented to person, place, and time.  Skin: Skin is warm and dry.  Psychiatric: She has a normal mood and affect. Her behavior is normal. Judgment and thought content normal.       Assessment & Plan:   Problem List Items Addressed This Visit      Respiratory   Allergic rhinitis - Primary    Symptoms and exam consistent with allergic rhinitis refractory to  second-generation antihistamines. Recommend inhaled nasal corticosteroids and start Singulair. If symptoms worsen or fail to improve consider prednisone or possible allergy testing.      Relevant Medications   montelukast (SINGULAIR) 10 MG tablet     Musculoskeletal and Integument   Trochanteric bursitis of right hip    Symptoms and exam consistent with greater trochanteric bursitis. Treat conservatively with ice and home exercise therapy. Start Pennsaid and Duexis. Encouraged exercise and weight loss to help decrease her symptoms. Follow up if symptoms do not improve for further imaging and treatment.       Relevant Medications   Diclofenac Sodium (PENNSAID) 2 % SOLN   Ibuprofen-Famotidine 800-26.6 MG TABS     Other   Anterior knee pain    Symptoms and  exam consistent with patellar tendinitis/patellofemoral syndrome. There is also possibility of osteoarthritis given her current weight. Treat conservatively with ice and home therapy. Start Pennsaid and Duexis. Encouraged weight loss to help decrease symptoms. Follow up if symptoms do not improve for imaging or possible cortisone injection.       Relevant Medications   Diclofenac Sodium (PENNSAID) 2 % SOLN   Ibuprofen-Famotidine 800-26.6 MG TABS

## 2016-01-28 NOTE — Patient Instructions (Addendum)
Thank you for choosing [facility].  Summary/Instructions:  Ice 2-3 times per day and as needed. Pennsaid - 2x per day about pinkie sized dose Duexis - 3x per day as needed Home exercises daily. Wear a knee sleeve / wrist splint   Your prescription(s) have been submitted to your pharmacy or been printed and provided for you. Please take as directed and contact our office if you believe you are having problem(s) with the medication(s) or have any questions.  If your symptoms worsen or fail to improve, please contact our office for further instruction, or in case of emergency go directly to the emergency room at the closest medical facility.   Generic Knee Exercises EXERCISES RANGE OF MOTION (ROM) AND STRETCHING EXERCISES These exercises may help you when beginning to rehabilitate your injury. Your symptoms may resolve with or without further involvement from your physician, physical therapist, or athletic trainer. While completing these exercises, remember:   Restoring tissue flexibility helps normal motion to return to the joints. This allows healthier, less painful movement and activity.  An effective stretch should be held for at least 30 seconds.  A stretch should never be painful. You should only feel a gentle lengthening or release in the stretched tissue. STRETCH - Knee Extension, Prone  Lie on your stomach on a firm surface, such as a bed or countertop. Place your right / left knee and leg just beyond the edge of the surface. You may wish to place a towel under the far end of your right / left thigh for comfort.  Relax your leg muscles and allow gravity to straighten your knee. Your clinician may advise you to add an ankle weight if more resistance is helpful for you.  You should feel a stretch in the back of your right / left knee. Hold this position for __________ seconds. Repeat __________ times. Complete this stretch __________ times per day. * Your physician,  physical therapist, or athletic trainer may ask you to add ankle weight to enhance your stretch.  RANGE OF MOTION - Knee Flexion, Active  Lie on your back with both knees straight. (If this causes back discomfort, bend your opposite knee, placing your foot flat on the floor.)  Slowly slide your heel back toward your buttocks until you feel a gentle stretch in the front of your knee or thigh.  Hold for __________ seconds. Slowly slide your heel back to the starting position. Repeat __________ times. Complete this exercise __________ times per day.  STRETCH - Quadriceps, Prone   Lie on your stomach on a firm surface, such as a bed or padded floor.  Bend your right / left knee and grasp your ankle. If you are unable to reach your ankle or pant leg, use a belt around your foot to lengthen your reach.  Gently pull your heel toward your buttocks. Your knee should not slide out to the side. You should feel a stretch in the front of your thigh and/or knee.  Hold this position for __________ seconds. Repeat __________ times. Complete this stretch __________ times per day.  STRETCH - Hamstrings, Supine   Lie on your back. Loop a belt or towel over the ball of your right / left foot.  Straighten your right / left knee and slowly pull on the belt to raise your leg. Do not allow the right / left knee to bend. Keep your opposite leg flat on the floor.  Raise the leg until you feel a gentle stretch behind your  right / left knee or thigh. Hold this position for __________ seconds. Repeat __________ times. Complete this stretch __________ times per day.  STRENGTHENING EXERCISES These exercises may help you when beginning to rehabilitate your injury. They may resolve your symptoms with or without further involvement from your physician, physical therapist, or athletic trainer. While completing these exercises, remember:   Muscles can gain both the endurance and the strength needed for everyday activities  through controlled exercises.  Complete these exercises as instructed by your physician, physical therapist, or athletic trainer. Progress the resistance and repetitions only as guided.  You may experience muscle soreness or fatigue, but the pain or discomfort you are trying to eliminate should never worsen during these exercises. If this pain does worsen, stop and make certain you are following the directions exactly. If the pain is still present after adjustments, discontinue the exercise until you can discuss the trouble with your clinician. STRENGTH - Quadriceps, Isometrics  Lie on your back with your right / left leg extended and your opposite knee bent.  Gradually tense the muscles in the front of your right / left thigh. You should see either your knee cap slide up toward your hip or increased dimpling just above the knee. This motion will push the back of the knee down toward the floor/mat/bed on which you are lying.  Hold the muscle as tight as you can without increasing your pain for __________ seconds.  Relax the muscles slowly and completely in between each repetition. Repeat __________ times. Complete this exercise __________ times per day.  STRENGTH - Quadriceps, Short Arcs   Lie on your back. Place a __________ inch towel roll under your knee so that the knee slightly bends.  Raise only your lower leg by tightening the muscles in the front of your thigh. Do not allow your thigh to rise.  Hold this position for __________ seconds. Repeat __________ times. Complete this exercise __________ times per day.  OPTIONAL ANKLE WEIGHTS: Begin with ____________________, but DO NOT exceed ____________________. Increase in 1 pound/0.5 kilogram increments.  STRENGTH - Quadriceps, Straight Leg Raises  Quality counts! Watch for signs that the quadriceps muscle is working to insure you are strengthening the correct muscles and not "cheating" by substituting with healthier muscles.  Lay on  your back with your right / left leg extended and your opposite knee bent.  Tense the muscles in the front of your right / left thigh. You should see either your knee cap slide up or increased dimpling just above the knee. Your thigh may even quiver.  Tighten these muscles even more and raise your leg 4 to 6 inches off the floor. Hold for __________ seconds.  Keeping these muscles tense, lower your leg.  Relax the muscles slowly and completely in between each repetition. Repeat __________ times. Complete this exercise __________ times per day.  STRENGTH - Hamstring, Curls  Lay on your stomach with your legs extended. (If you lay on a bed, your feet may hang over the edge.)  Tighten the muscles in the back of your thigh to bend your right / left knee up to 90 degrees. Keep your hips flat on the bed/floor.  Hold this position for __________ seconds.  Slowly lower your leg back to the starting position. Repeat __________ times. Complete this exercise __________ times per day.  OPTIONAL ANKLE WEIGHTS: Begin with ____________________, but DO NOT exceed ____________________. Increase in 1 pound/0.5 kilogram increments.  STRENGTH - Quadriceps, Squats  Stand in a door  frame so that your feet and knees are in line with the frame.  Use your hands for balance, not support, on the frame.  Slowly lower your weight, bending at the hips and knees. Keep your lower legs upright so that they are parallel with the door frame. Squat only within the range that does not increase your knee pain. Never let your hips drop below your knees.  Slowly return upright, pushing with your legs, not pulling with your hands. Repeat __________ times. Complete this exercise __________ times per day.  STRENGTH - Quadriceps, Wall Slides  Follow guidelines for form closely. Increased knee pain often results from poorly placed feet or knees.  Lean against a smooth wall or door and walk your feet out 18-24 inches. Place  your feet hip-width apart.  Slowly slide down the wall or door until your knees bend __________ degrees.* Keep your knees over your heels, not your toes, and in line with your hips, not falling to either side.  Hold for __________ seconds. Stand up to rest for __________ seconds in between each repetition. Repeat __________ times. Complete this exercise __________ times per day. * Your physician, physical therapist, or athletic trainer will alter this angle based on your symptoms and progress.   This information is not intended to replace advice given to you by your health care provider. Make sure you discuss any questions you have with your health care provider.   Document Released: 09/20/2005 Document Revised: 11/27/2014 Document Reviewed: 02/18/2009 Elsevier Interactive Patient Education 2016 [employer].  Trochanteric Bursitis You have hip pain due to trochanteric bursitis. Bursitis means that the sack near the outside of the hip is filled with fluid and inflamed. This sack is made up of protective soft tissue. The pain from trochanteric bursitis can be severe and keep you from sleep. It can radiate to the buttocks or down the outside of the thigh to the knee. The pain is almost always worse when rising from the seated or lying position and with walking. Pain can improve after you take a few steps. It happens more often in people with hip joint and lumbar spine problems, such as arthritis or previous surgery. Very rarely the trochanteric bursa can become infected, and antibiotics and/or surgery may be needed. Treatment often includes an injection of local anesthetic mixed with cortisone medicine. This medicine is injected into the area where it is most tender over the hip. Repeat injections may be necessary if the response to treatment is slow. You can apply ice packs over the tender area for 30 minutes every 2 hours for the next few days. Anti-inflammatory and/or narcotic pain medicine may also  be helpful. Limit your activity for the next few days if the pain continues. See your caregiver in 5-10 days if you are not greatly improved.  SEEK IMMEDIATE MEDICAL CARE IF:  You develop severe pain, fever, or increased redness.  You have pain that radiates below the knee. EXERCISES STRETCHING EXERCISES - Trochanteric Bursitis  These exercises may help you when beginning to rehabilitate your injury. Your symptoms may resolve with or without further involvement from your physician, physical therapist, or athletic trainer. While completing these exercises, remember:   Restoring tissue flexibility helps normal motion to return to the joints. This allows healthier, less painful movement and activity.  An effective stretch should be held for at least 30 seconds.  A stretch should never be painful. You should only feel a gentle lengthening or release in the stretched tissue. STRETCH -  Iliotibial Band  On the floor or bed, lie on your side so your injured leg is on top. Bend your knee and grab your ankle.  Slowly bring your knee back so that your thigh is in line with your trunk. Keep your heel at your buttocks and gently arch your back so your head, shoulders and hips line up.  Slowly lower your leg so that your knee approaches the floor/bed until you feel a gentle stretch on the outside of your thigh. If you do not feel a stretch and your knee will not fall farther, place the heel of your opposite foot on top of your knee and pull your thigh down farther.  Hold this stretch for __________ seconds.  Repeat __________ times. Complete this exercise __________ times per day. STRETCH - Hamstrings, Supine   Lie on your back. Loop a belt or towel over the ball of your foot as shown.  Straighten your knee and slowly pull on the belt to raise your injured leg. Do not allow the knee to bend. Keep your opposite leg flat on the floor.  Raise the leg until you feel a gentle stretch behind your knee or  thigh. Hold this position for __________ seconds.  Repeat __________ times. Complete this stretch __________ times per day. STRETCH - Quadriceps, Prone   Lie on your stomach on a firm surface, such as a bed or padded floor.  Bend your knee and grasp your ankle. If you are unable to reach your ankle or pant leg, use a belt around your foot to lengthen your reach.  Gently pull your heel toward your buttocks. Your knee should not slide out to the side. You should feel a stretch in the front of your thigh and/or knee.  Hold this position for __________ seconds.  Repeat __________ times. Complete this stretch __________ times per day. STRETCHING - Hip Flexors, Lunge Half kneel with your knee on the floor and your opposite knee bent and directly over your ankle.  Keep good posture with your head over your shoulders. Tighten your buttocks to point your tailbone downward; this will prevent your back from arching too much.  You should feel a gentle stretch in the front of your thigh and/or hip. If you do not feel any resistance, slightly slide your opposite foot forward and then slowly lunge forward so your knee once again lines up over your ankle. Be sure your tailbone remains pointed downward.  Hold this stretch for __________ seconds.  Repeat __________ times. Complete this stretch __________ times per day. STRETCH - Adductors, Lunge  While standing, spread your legs.  Lean away from your injured leg by bending your opposite knee. You may rest your hands on your thigh for balance.  You should feel a stretch in your inner thigh. Hold for __________ seconds.  Repeat __________ times. Complete this exercise __________ times per day.   This information is not intended to replace advice given to you by your health care provider. Make sure you discuss any questions you have with your health care provider.   Document Released: 12/14/2004 Document Revised: 03/23/2015 Document Reviewed:  02/18/2009 Elsevier Interactive Patient Education [employer].

## 2016-03-01 ENCOUNTER — Telehealth: Payer: Self-pay | Admitting: Endocrinology

## 2016-03-01 NOTE — Telephone Encounter (Signed)
We haven't heard anything from them

## 2016-03-01 NOTE — Telephone Encounter (Signed)
PT called to ask if we've received anything from [facility] [place] about her Invokana.  She is aware she needs to schedule her follow up appointment and will call back for that when she has her work schedule.

## 2016-03-03 ENCOUNTER — Other Ambulatory Visit: Payer: Self-pay | Admitting: Endocrinology

## 2016-03-06 ENCOUNTER — Other Ambulatory Visit: Payer: Self-pay | Admitting: Endocrinology

## 2016-03-07 ENCOUNTER — Other Ambulatory Visit: Payer: Self-pay | Admitting: Endocrinology

## 2016-03-07 ENCOUNTER — Telehealth: Payer: Self-pay | Admitting: Endocrinology

## 2016-03-07 NOTE — Telephone Encounter (Signed)
rx was sent in yesteryday I spoke to the pharmacist who said it didn't come through, I gave him a verbal. He said he would contact the patient.

## 2016-03-07 NOTE — Telephone Encounter (Signed)
Pt said that she feels she has symptoms of a yeast infection from the [place] and would like something called in for her

## 2016-03-13 ENCOUNTER — Telehealth: Payer: Self-pay | Admitting: Family

## 2016-03-13 NOTE — Telephone Encounter (Signed)
[name]  Patient Name: Destiny Tucker  DOB: 1956-04-27    Initial Comment Caller states she is having hip and lower back pain down to knee starting last week   Nurse Assessment  Nurse: Harlow Mares, RN, Suanne Marker Date/Time (Eastern Time): 03/13/2016 2:41:11 PM  Confirm and document reason for call. If symptomatic, describe symptoms. You must click the next button to save text entered. ---Caller states she is having Left hip and lower back pain down to left knee starting last week. Reports that she has a problem moving the left knee when she sits and tries to stand back up. Has been raining a lot recently and she is having more pain. Rates her current pain (in hip 9/10, is intermittent). 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 has been using hot./cold and this morning, she is having problems bending over and lifting her legs due to this pain. Reports that her hip is bothering her worse than her back. But both are an issue.  Has the patient traveled out of the country within the last 30 days? ---Not Applicable  Does the patient have any new or worsening symptoms? ---Yes  Will a triage be completed? ---Yes  Related visit to physician within the last 2 weeks? ---No  Does the PT have any chronic conditions? (i.e. diabetes, asthma, etc.) ---Yes  List chronic conditions. ---diabetes,  Is this a behavioral health or substance abuse call? ---No     Guidelines    Guideline Title Affirmed Question Affirmed Notes  Back Pain [1] MODERATE back pain (e.g., interferes with normal activities) AND [2] present > 3 days    Final Disposition User   See PCP When Office is Open (within 3 days) Harlow Mares, RN, Suanne Marker    Comments  Caller has scheduled appt with Dr. Elna Breslow for Thursday 03/16/16 at 11am.   Referrals  REFERRED TO PCP OFFICE   Disagree/Comply:  Leta Baptist

## 2016-03-13 NOTE — Telephone Encounter (Signed)
[place]  Patient Name: Destiny Tucker  DOB: 01/24/1956    Initial Comment Caller states she is having hip and lower back pain down to knee starting last week   Nurse Assessment  Nurse: Harlow Mares, RN, Suanne Marker Date/Time (Eastern Time): 03/13/2016 2:41:11 PM  Confirm and document reason for call. If symptomatic, describe symptoms. You must click the next button to save text entered. ---Caller states she is having Left hip and lower back pain down to left knee starting last week. Reports that she has a problem moving the left knee when she sits and tries to stand back up. Has been raining a lot recently and she is having more pain. Rates her current pain (in hip 9/10, is intermittent). 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 has been using hot./cold and this morning, she is having problems bending over and lifting her legs due to this pain. Reports that her hip is bothering her worse than her back. But both are an issue.  Has the patient traveled out of the country within the last 30 days? ---Not Applicable  Does the patient have any new or worsening symptoms? ---Yes  Will a triage be completed? ---Yes  Related visit to physician within the last 2 weeks? ---No  Does the PT have any chronic conditions? (i.e. diabetes, asthma, etc.) ---Yes  List chronic conditions. ---diabetes,  Is this a behavioral health or substance abuse call? ---No     Guidelines    Guideline Title Affirmed Question Affirmed Notes  Back Pain [1] MODERATE back pain (e.g., interferes with normal activities) AND [2] present > 3 days    Final Disposition User   See PCP When Office is Open (within 3 days) Harlow Mares, RN, Suanne Marker    Comments  Caller has scheduled appt with Dr. Elna Breslow for Thursday 03/16/16 at 11am.   Referrals  REFERRED TO PCP OFFICE   Disagree/Comply:  Leta Baptist

## 2016-03-16 ENCOUNTER — Ambulatory Visit (INDEPENDENT_AMBULATORY_CARE_PROVIDER_SITE_OTHER)
Admission: RE | Admit: 2016-03-16 | Discharge: 2016-03-16 | Disposition: A | Discharge status: Home / Self Care | Payer: BLUE CROSS/BLUE SHIELD | Source: Ambulatory Visit | Attending: Family | Admitting: Family

## 2016-03-16 ENCOUNTER — Ambulatory Visit (INDEPENDENT_AMBULATORY_CARE_PROVIDER_SITE_OTHER): Payer: BLUE CROSS/BLUE SHIELD | Admitting: Family

## 2016-03-16 ENCOUNTER — Encounter: Payer: Self-pay | Admitting: Family

## 2016-03-16 VITALS — BP 128/82 | HR 92 | Temp 97.7°F | Resp 16 | Ht 64.0 in | Wt 243.0 lb

## 2016-03-16 DIAGNOSIS — M25552 Pain in left hip: Secondary | ICD-10-CM | POA: Diagnosis not present

## 2016-03-16 DIAGNOSIS — M25562 Pain in left knee: Secondary | ICD-10-CM | POA: Diagnosis not present

## 2016-03-16 DIAGNOSIS — G8929 Other chronic pain: Secondary | ICD-10-CM | POA: Insufficient documentation

## 2016-03-16 DIAGNOSIS — M25561 Pain in right knee: Secondary | ICD-10-CM

## 2016-03-16 NOTE — Assessment & Plan Note (Signed)
Left knee pain with concern for osteoarthritis or possible meniscal tear. Ultrasound appears normal. Obtain x-rays for osteoarthritis. Treat conservatively with ice and home exercise therapy. Continue current dosage of Pennsaid and Duexis. Follow up pending x-ray results.

## 2016-03-16 NOTE — Progress Notes (Signed)
Subjective:    Patient ID: Destiny Tucker, female    DOB: February 04, 1956, 60 y.o.   MRN: [id number]  Chief Complaint  Patient presents with  . Hip Pain    Left hip and lower back pain that runs down left thigh, thinks she lifted boxes at work wrong, can not lift left leg much    HPI:  Destiny Tucker is a 60 y.o. female who  has a past medical history of OSTEOPENIA; TOBACCO USE, QUIT; HYPERTENSION; HYPERLIPIDEMIA; DEPRESSION; ANXIETY; ALLERGIC RHINITIS; Diabetes mellitus ([place]); and Internal hemorrhoids without mention of complication (123456). and presents today for an office visit.   This is a new problem. Associated symptom of pain located in her back and left hip that has been going on for about 1 week since she picked up a heavy mop and bucket. Denies any specific sounds/sensations heard or felt. Pain started about 24 hours after the initial event. Modifying factors include ice/heat which did not help very much as well as ibuprofen and Tylenol. Severity of the pain is enough to alter her functionality including getting in and out of the car. Denies any saddle anesthesia or changes to bowel/bladder habits. The pain is tolerable but still enough that she cannot complete her instrumental activities of daily living and activities of daily living.   2.) Left knee pain - Associated symptom of pain located in her left knee has been going on for about 1 month and describes a catching/locking sensation. Modifying factors include ice/heat, tylenol and ibuprofen. Symptoms have improved some but remain.   Allergies  Allergen Reactions  . Codeine      Current Outpatient Prescriptions on File Prior to Visit  Medication Sig Dispense Refill  . Alcohol Swabs (B-D SINGLE USE SWABS REGULAR) PADS USE AS DIRECTED TWICE DAILY 100 each 9  . ASPIR-LOW 81 MG EC tablet TAKE 1 TABLET BY MOUTH ONCE DAILY 90 tablet 3  . canagliflozin (INVOKANA) 100 MG TABS tablet Take 1 tablet (100 mg total) by mouth daily  before breakfast. 30 tablet 3  . clonazePAM (KLONOPIN) 1 MG tablet TAKE ONE TABLET BY MOUTH TWICE DAILY AS NEEDED FOR ANXIETY 180 tablet 0  . Diclofenac Sodium (PENNSAID) 2 % SOLN Place 1 application onto the skin 2 (two) times daily as needed. 112 g 1  . diphenhydrAMINE (BENADRYL) 25 MG tablet Take 25 mg by mouth every 6 (six) hours as needed.    . fluconazole (DIFLUCAN) 150 MG tablet TAKE ONE TABLET BY MOUTH AS A ONE-TIME DOSE 1 tablet 0  . fluconazole (DIFLUCAN) 150 MG tablet TAKE ONE TABLET BY MOUTH AS A ONE-TIME DOSE 1 tablet 0  . glucose blood test strip Use to test up to 3 times daily 300 each 1  . Ibuprofen-Famotidine 800-26.6 MG TABS Take 1 tablet by mouth 3 (three) times daily as needed. 90 tablet 1  . insulin regular human CONCENTRATED (HUMULIN R) 500 UNIT/ML kwikpen 45 units every morning and 55 units every evening 2 pen 3  . Insulin Syringe-Needle U-100 (B-D INSULIN SYRINGE) 31G X 5/16" 0.3 ML MISC Use 2 syringes daily with Levemir Insulin 100 each 3  . lovastatin (MEVACOR) 20 MG tablet TAKE ONE TABLET BY MOUTH AT BEDTIME 90 tablet 1  . metFORMIN (GLUCOPHAGE) 1000 MG tablet Take 1 tablet (1,000 mg total) by mouth 2 (two) times daily with a meal. 180 tablet 0  . montelukast (SINGULAIR) 10 MG tablet Take 1 tablet (10 mg total) by mouth at bedtime. 30 tablet  3  . Multiple Vitamins-Minerals (MULTIVITAMIN WITH MINERALS) tablet Take 1 tablet by mouth daily.    [name] NOVOFINE 32G X 6 MM MISC USE 3 TIMES A DAY 300 each 1  . omega-3 fish oil (MAXEPA) 1000 MG CAPS capsule Take by mouth.    [name] DELICA LANCETS [id number] MISC USE TO TEST UP TO 3 TIMES DAILY 300 each 1  . sertraline (ZOLOFT) 100 MG tablet TAKE ONE TABLET BY MOUTH ONCE DAILY 90 tablet 0  . TURMERIC PO Take by mouth.     No current facility-administered medications on file prior to visit.     Past Surgical History  Procedure Laterality Date  . Unilateral salpingo-oophorectomy    . Mohs surgery      Precancerous skin lesion  .  Removal of fallopion tube & ovary  1986  . Removal of cyst & other ovary  09/2004  . Anus surgery      Past Medical History  Diagnosis Date  . OSTEOPENIA   . TOBACCO USE, QUIT   . HYPERTENSION   . HYPERLIPIDEMIA   . DEPRESSION   . ANXIETY   . ALLERGIC RHINITIS   . Diabetes mellitus ([place])   . Internal hemorrhoids without mention of complication [id number]    Colonoscopy--Dr. Fuller Plan , pts. states hemorrhoids are uncomfortable     Review of Systems  Constitutional: Negative for fever and chills.  Musculoskeletal: Positive for back pain.       Positive for left hip pain  Neurological: Negative for weakness and numbness.      Objective:    BP 128/82 mmHg  Pulse 92  Temp(Src) 97.7 F (36.5 C) (Oral)  Resp 16  Ht 5\' 4"  (1.626 m)  Wt 243 lb (110.224 kg)  BMI 41.69 kg/m2  SpO2 97% Nursing note and vital signs reviewed.  Physical Exam  Constitutional: She is oriented to person, place, and time. She appears well-developed and well-nourished. No distress.  Cardiovascular: Normal rate, regular rhythm, normal heart sounds and intact distal pulses.   Pulmonary/Chest: Effort normal and breath sounds normal.  Musculoskeletal:  Left hip - No obvious deformity, discoloration or edema noted. Tenderness located over lateral aspect of hip generalized over and around greater trochanter and gluteus medius. Range of motion is restricted secondary to discomfort with normal strength. Distal pulses and sensation are intact and appropriate.   Left knee - No obvious deformity, discoloration, or edema. Medial joint line tenderness with normal range of motion and good strength. Ligamentous testing is negative with questionable positive meniscus. Distal pulses and sensation are intact and appropriate.   Neurological: She is alert and oriented to person, place, and time.  Skin: Skin is warm and dry.  Psychiatric: She has a normal mood and affect. Her behavior is normal. Judgment and thought content  normal.   Examination: Ultrasound of knee Date:  03/16/2016 Patient Name: Destiny Tucker History: Left knee pain x 1 month Findings:  The extensor mechanism, including the quadriceps tendon, patella, and patellar tendon is normal without bursal abnormalities. No significant joint effusion or synovial hypertrophy. The medial collateral and lateral collateral ligaments are normal. Unremarkable iliotibial tract, biceps Morse, popliteus tendon, and common peroneal nerve. No Baker cyst. Limited evaluation of the menisci is unremarkable.  Impression:  Unremarkable ultrasound of the knee.       Images located under the media tab. Ordered, performed and interpreted by Terri Piedra, FNP.      Assessment & Plan:   Problem List Items Addressed This  Visit      Other   Left hip pain - Primary    Left hip pain with questionable bursitis/strain. Obtain x-rays to rule out acute pathology. Treat conservatively with ice and home exercise therapy. Continue current dosage of Duexis and Pennsaid. Also discussed importance of weight loss and its contribution to symptoms.       Relevant Orders   DG HIP UNILAT WITH PELVIS 2-3 VIEWS LEFT   Ambulatory referral to Physical Therapy   Left knee pain    Left knee pain with concern for osteoarthritis or possible meniscal tear. Ultrasound appears normal. Obtain x-rays for osteoarthritis. Treat conservatively with ice and home exercise therapy. Continue current dosage of Pennsaid and Duexis. Follow up pending x-ray results.       Relevant Orders   Ambulatory referral to Physical Therapy   DG Knee Complete 4 Views Left   [place] Extrem Up Left Ltd       I am having Ms. Hoefling maintain her glucose blood, NOVOFINE, ASPIR-LOW, B-D SINGLE USE SWABS REGULAR, ONETOUCH DELICA LANCETS [id number], Insulin Syringe-Needle U-100, multivitamin with minerals, TURMERIC PO, omega-3 fish oil, diphenhydrAMINE, metFORMIN, lovastatin, clonazePAM, insulin regular human CONCENTRATED,  canagliflozin, sertraline, montelukast, Diclofenac Sodium, Ibuprofen-Famotidine, fluconazole, and fluconazole.   No orders of the defined types were placed in this encounter.     Follow-up: Return if symptoms worsen or fail to improve.  Mauricio Po, FNP

## 2016-03-16 NOTE — Progress Notes (Signed)
Pre visit review using our clinic review tool, if applicable. No additional management support is needed unless otherwise documented below in the visit note. 

## 2016-03-16 NOTE — Assessment & Plan Note (Signed)
Left hip pain with questionable bursitis/strain. Obtain x-rays to rule out acute pathology. Treat conservatively with ice and home exercise therapy. Continue current dosage of Duexis and Pennsaid. Also discussed importance of weight loss and its contribution to symptoms.

## 2016-03-16 NOTE — Patient Instructions (Signed)
Thank you for choosing [facility].  Summary/Instructions:  Ice 2-3 times per day.  Duexis 3x per day  Exercises daily.  Please stop by radiology on the basement level of the building for your x-rays. Your results will be released to [facility] (or called to you) after review, usually within 72 hours after test completion. If any treatments or changes are necessary, you will be notified at that same time.  Referrals have been made during this visit. You should expect to hear back from our schedulers in about 7-10 days in regards to establishing an appointment with the specialists we discussed.   If your symptoms worsen or fail to improve, please contact our office for further instruction, or in case of emergency go directly to the emergency room at the closest medical facility.   Generic Hip Exercises RANGE OF MOTION (ROM) AND STRETCHING EXERCISES  These exercises may help you when beginning to rehabilitate your injury. Doing them too aggressively can worsen your condition. Complete them slowly and gently. Your symptoms may resolve with or without further involvement from your physician, physical therapist or athletic trainer. While completing these exercises, remember:   Restoring tissue flexibility helps normal motion to return to the joints. This allows healthier, less painful movement and activity.  An effective stretch should be held for at least 30 seconds.  A stretch should never be painful. You should only feel a gentle lengthening or release in the stretched tissue. If these stretches worsen your symptoms even when done gently, consult your physician, physical therapist or athletic trainer. STRETCH - Hamstrings, Supine   Lie on your back. Loop a belt or towel over the ball of your right / left foot.  Straighten your right / left knee and slowly pull on the belt to raise your leg. Do not allow the right / left knee to bend. Keep your opposite leg flat on the floor.  Raise  the leg until you feel a gentle stretch behind your right / left knee or thigh. Hold this position for __________ seconds. Repeat __________ times. Complete this stretch __________ times per day.  STRETCH - Hip Rotators   Lie on your back on a firm surface. Grasp your right / left knee with your right / left hand and your ankle with your opposite hand.  Keeping your hips and shoulders firmly planted, gently pull your right / left knee and rotate your lower leg toward your opposite shoulder until you feel a stretch in your buttocks.  Hold this stretch for __________ seconds. Repeat this stretch __________ times. Complete this stretch __________ times per day. STRETCH - Hamstrings/Adductors, V-Sit   Sit on the floor with your legs extended in a large "V," keeping your knees straight.  With your head and chest upright, bend at your waist reaching for your right foot to stretch your left adductors.  You should feel a stretch in your left inner thigh. Hold for __________ seconds.  Return to the upright position to relax your leg muscles.  Continuing to keep your chest upright, bend straight forward at your waist to stretch your hamstrings.  You should feel a stretch behind both of your thighs and/or knees. Hold for __________ seconds.  Return to the upright position to relax your leg muscles.  Repeat steps 2 through 4 for opposite leg. Repeat __________ times. Complete this exercise __________ times per day.  STRETCHING - Hip Flexors, Lunge  Half kneel with your right / left knee on the floor and your opposite  knee bent and directly over your ankle.  Keep good posture with your head over your shoulders. Tighten your buttocks to point your tailbone downward; this will prevent your back from arching too much.  You should feel a gentle stretch in the front of your thigh and/or hip. If you do not feel any resistance, slightly slide your opposite foot forward and then slowly lunge forward so  your knee once again lines up over your ankle. Be sure your tailbone remains pointed downward.  Hold this stretch for __________ seconds. Repeat __________ times. Complete this stretch __________ times per day. STRENGTHENING EXERCISES These exercises may help you when beginning to rehabilitate your injury. They may resolve your symptoms with or without further involvement from your physician, physical therapist or athletic trainer. While completing these exercises, remember:   Muscles can gain both the endurance and the strength needed for everyday activities through controlled exercises.  Complete these exercises as instructed by your physician, physical therapist or athletic trainer. Progress the resistance and repetitions only as guided.  You may experience muscle soreness or fatigue, but the pain or discomfort you are trying to eliminate should never worsen during these exercises. If this pain does worsen, stop and make certain you are following the directions exactly. If the pain is still present after adjustments, discontinue the exercise until you can discuss the trouble with your clinician. STRENGTH - Hip Extensors, Bridge   Lie on your back on a firm surface. Bend your knees and place your feet flat on the floor.  Tighten your buttocks muscles and lift your bottom off the floor until your trunk is level with your thighs. You should feel the muscles in your buttocks and back of your thighs working. If you do not feel these muscles, slide your feet 1-2 inches further away from your buttocks.  Hold this position for __________ seconds.  Slowly lower your hips to the starting position and allow your buttock muscles relax completely before beginning the next repetition.  If this exercise is too easy, you may cross your arms over your chest. Repeat __________ times. Complete this exercise __________ times per day.  STRENGTH - Hip Abductors, Straight Leg Raises  Be aware of your form  throughout the entire exercise so that you exercise the correct muscles. Sloppy form means that you are not strengthening the correct muscles.  Lie on your side so that your head, shoulders, knee and hip line up. You may bend your lower knee to help maintain your balance. Your right / left leg should be on top.  Roll your hips slightly forward, so that your hips are stacked directly over each other and your right / left knee is facing forward.  Lift your top leg up 4-6 inches, leading with your heel. Be sure that your foot does not drift forward or that your knee does not roll toward the ceiling.  Hold this position for __________ seconds. You should feel the muscles in your outer hip lifting (you may not notice this until your leg begins to tire).  Slowly lower your leg to the starting position. Allow the muscles to fully relax before beginning the next repetition. Repeat __________ times. Complete this exercise __________ times per day.  STRENGTH - Hip Adductors, Straight Leg Raises   Lie on your side so that your head, shoulders, knee and hip line up. You may place your upper foot in front to help maintain your balance. Your right / left leg should be on the bottom.  Roll your hips slightly forward, so that your hips are stacked directly over each other and your right / left knee is facing forward.  Tense the muscles in your inner thigh and lift your bottom leg 4-6 inches. Hold this position for __________ seconds.  Slowly lower your leg to the starting position. Allow the muscles to fully relax before beginning the next repetition. Repeat __________ times. Complete this exercise __________ times per day.  STRENGTH - Quadriceps, Straight Leg Raises  Quality counts! Watch for signs that the quadriceps muscle is working to insure you are strengthening the correct muscles and not "cheating" by substituting with healthier muscles.  Lay on your back with your right / left leg extended and your  opposite knee bent.  Tense the muscles in the front of your right / left thigh. You should see either your knee cap slide up or increased dimpling just above the knee. Your thigh may even quiver.  Tighten these muscles even more and raise your leg 4 to 6 inches off the floor. Hold for right / left seconds.  Keeping these muscles tense, lower your leg.  Relax the muscles slowly and completely in between each repetition. Repeat __________ times. Complete this exercise __________ times per day.  STRENGTH - Hip Abductors, Standing  Tie one end of a rubber exercise band/tubing to a secure surface (table, pole) and tie a loop at the other end.  Place the loop around your right / left ankle. Keeping your ankle with the band directly opposite of the secured end, step away until there is tension in the tube/band.  Hold onto a chair as needed for balance.  Keeping your back upright, your shoulders over your hips, and your toes pointing forward, lift your right / left leg out to your side. Be sure to lift your leg with your hip muscles. Do not "throw" your leg or tip your body to lift your leg.  Slowly and with control, return to the starting position. Repeat exercise __________ times. Complete this exercise __________ times per day.  STRENGTH - Quadriceps, Squats  Stand in a door frame so that your feet and knees are in line with the frame.  Use your hands for balance, not support, on the frame.  Slowly lower your weight, bending at the hips and knees. Keep your lower legs upright so that they are parallel with the door frame. Squat only within the range that does not increase your knee pain. Never let your hips drop below your knees.  Slowly return upright, pushing with your legs, not pulling with your hands.   This information is not intended to replace advice given to you by your health care provider. Make sure you discuss any questions you have with your health care provider.   Document  Released: 11/24/2005 Document Revised: 11/27/2014 Document Reviewed: 02/18/2009 Elsevier Interactive Patient Education [employer].

## 2016-03-17 ENCOUNTER — Telehealth: Payer: Self-pay | Admitting: Family

## 2016-03-17 NOTE — Telephone Encounter (Signed)
Pt called back to check up of this request, pt stated she might be outside when we call, it is okey to leave detail massage on this phone # if no answer  # [phone]

## 2016-03-17 NOTE — Telephone Encounter (Signed)
Is requesting call back with x ray results  °

## 2016-03-17 NOTE — Telephone Encounter (Signed)
Please advise 

## 2016-03-20 NOTE — Telephone Encounter (Signed)
Noted  

## 2016-03-20 NOTE — Telephone Encounter (Signed)
Destiny Tucker can you call her about her xray results?

## 2016-03-20 NOTE — Telephone Encounter (Signed)
Pt aware of results. Will call back to schedule an appointment for injection when she wants it.

## 2016-04-07 ENCOUNTER — Other Ambulatory Visit: Payer: Self-pay | Admitting: Endocrinology

## 2016-04-25 ENCOUNTER — Other Ambulatory Visit: Payer: Self-pay | Admitting: Family

## 2016-05-05 ENCOUNTER — Other Ambulatory Visit: Payer: Self-pay | Admitting: Family

## 2016-05-09 ENCOUNTER — Other Ambulatory Visit: Payer: Self-pay | Admitting: Endocrinology

## 2016-05-09 ENCOUNTER — Other Ambulatory Visit: Payer: Self-pay

## 2016-05-09 ENCOUNTER — Ambulatory Visit (INDEPENDENT_AMBULATORY_CARE_PROVIDER_SITE_OTHER): Payer: BLUE CROSS/BLUE SHIELD | Admitting: Family

## 2016-05-09 VITALS — BP 128/80 | HR 90 | Temp 98.0°F | Resp 16 | Ht 64.0 in | Wt 246.0 lb

## 2016-05-09 DIAGNOSIS — M25561 Pain in right knee: Secondary | ICD-10-CM | POA: Insufficient documentation

## 2016-05-09 NOTE — Progress Notes (Signed)
Subjective:    Patient ID: Destiny Tucker, female    DOB: [birth year], 60 y.o.   MRN: [id number]  Chief Complaint  Patient presents with  . Knee Pain    right knee pain, has had pain shoot in the back of knee, started on saturday    HPI:  Destiny Tucker is a 60 y.o. female who  has a past medical history of OSTEOPENIA; TOBACCO USE, QUIT; HYPERTENSION; HYPERLIPIDEMIA; DEPRESSION; ANXIETY; ALLERGIC RHINITIS; Diabetes mellitus ([place]); and Internal hemorrhoids without mention of complication (123456). and presents today for a follow up office visit.    Associated symptom of pain located in the back of her right knee has been going on for 4 days. Describes that when she stepped out of the car she felt a pain described as sharp. She was not able to drive home secondary to the pain with her right foot. Denies any sounds/sensations heard or felt. Modifying factors include ibuprofen which did help a little as well as a knee sleeve. Was able to move around more over the last 24 hours. Aggravated by standing.    Allergies  Allergen Reactions  . Codeine      Current Outpatient Prescriptions on File Prior to Visit  Medication Sig Dispense Refill  . Alcohol Swabs (B-D SINGLE USE SWABS REGULAR) PADS USE AS DIRECTED TWICE DAILY 100 each 9  . ASPIR-LOW 81 MG EC tablet TAKE 1 TABLET BY MOUTH ONCE DAILY 90 tablet 3  . clonazePAM (KLONOPIN) 1 MG tablet TAKE ONE TABLET BY MOUTH TWICE DAILY AS NEEDED FOR ANXIETY 180 tablet 0  . Diclofenac Sodium (PENNSAID) 2 % SOLN Place 1 application onto the skin 2 (two) times daily as needed. 112 g 1  . diphenhydrAMINE (BENADRYL) 25 MG tablet Take 25 mg by mouth every 6 (six) hours as needed.    . fluconazole (DIFLUCAN) 150 MG tablet TAKE ONE TABLET BY MOUTH AS A ONE-TIME DOSE 1 tablet 0  . fluconazole (DIFLUCAN) 150 MG tablet TAKE ONE TABLET BY MOUTH AS A ONE-TIME DOSE 1 tablet 0  . glucose blood test strip Use to test up to 3 times daily 300 each 1  .  Ibuprofen-Famotidine 800-26.6 MG TABS Take 1 tablet by mouth 3 (three) times daily as needed. 90 tablet 1  . insulin regular human CONCENTRATED (HUMULIN R) 500 UNIT/ML kwikpen 45 units every morning and 55 units every evening 2 pen 3  . Insulin Syringe-Needle U-100 (B-D INSULIN SYRINGE) 31G X 5/16" 0.3 ML MISC Use 2 syringes daily with Levemir Insulin 100 each 3  . INVOKANA 100 MG TABS tablet TAKE ONE TABLET BY MOUTH ONCE DAILY BEFORE  BREAKFAST 30 tablet 1  . lovastatin (MEVACOR) 20 MG tablet TAKE ONE TABLET BY MOUTH AT BEDTIME 90 tablet 1  . montelukast (SINGULAIR) 10 MG tablet Take 1 tablet (10 mg total) by mouth at bedtime. 30 tablet 3  . Multiple Vitamins-Minerals (MULTIVITAMIN WITH MINERALS) tablet Take 1 tablet by mouth daily.    [name] NOVOFINE 32G X 6 MM MISC USE 3 TIMES A DAY 300 each 1  . omega-3 fish oil (MAXEPA) 1000 MG CAPS capsule Take by mouth.    [name] DELICA LANCETS [id number] MISC USE TO TEST UP TO 3 TIMES DAILY 300 each 1  . sertraline (ZOLOFT) 100 MG tablet TAKE ONE TABLET BY MOUTH ONCE DAILY 90 tablet 1  . TURMERIC PO Take by mouth.     No current facility-administered medications on file prior to visit.  Review of Systems  Constitutional: Negative for fever and chills.  Musculoskeletal:       Positive for right knee pain  Neurological: Negative for weakness and numbness.      Objective:    BP 128/80 mmHg  Pulse 90  Temp(Src) 98 F (36.7 C) (Oral)  Resp 16  Ht 5\' 4"  (1.626 m)  Wt 246 lb (111.585 kg)  BMI 42.21 kg/m2  SpO2 97% Nursing note and vital signs reviewed.  Physical Exam  Constitutional: She is oriented to person, place, and time. She appears well-developed and well-nourished. No distress.  Cardiovascular: Normal rate, regular rhythm, normal heart sounds and intact distal pulses.   Pulmonary/Chest: Effort normal and breath sounds normal.  Musculoskeletal:  Right knee - Mild edema with no obvious deformity or discoloration. There is mild tenderness  on the medial and lateral joint lines as well as the popliteal fossa. Range of motion is slightly limited secondary to discomfort. Ligamentous testing is negative. Distal pulses and sensation are intact and appropriate.  Neurological: She is alert and oriented to person, place, and time.  Skin: Skin is warm and dry.  Psychiatric: She has a normal mood and affect. Her behavior is normal. Judgment and thought content normal.       Assessment & Plan:   Problem List Items Addressed This Visit      Other   Right knee pain - Primary    Right knee pain without specific trauma although concern for possible inflammation from synovitis or osteoarthritis. Ligamentous exam appears intact. Unlikely blood clot given continued patient movement. She has continued to gain weight which does complicate symptoms. Recommend conservative treatment with ice, compression, and elevation. Continue anti-inflammatories. Follow up in 1 week or sooner if needed.           I am having Ms. Jefferys maintain her glucose blood, NOVOFINE, ASPIR-LOW, B-D SINGLE USE SWABS REGULAR, ONETOUCH DELICA LANCETS [id number], Insulin Syringe-Needle U-100, multivitamin with minerals, TURMERIC PO, omega-3 fish oil, diphenhydrAMINE, insulin regular human CONCENTRATED, montelukast, Diclofenac Sodium, Ibuprofen-Famotidine, fluconazole, fluconazole, INVOKANA, sertraline, lovastatin, clonazePAM, and metFORMIN.    Follow-up: Return in about 1 week (around 05/16/2016).  Mauricio Po, FNP

## 2016-05-09 NOTE — Assessment & Plan Note (Signed)
Right knee pain without specific trauma although concern for possible inflammation from synovitis or osteoarthritis. Ligamentous exam appears intact. Unlikely blood clot given continued patient movement. She has continued to gain weight which does complicate symptoms. Recommend conservative treatment with ice, compression, and elevation. Continue anti-inflammatories. Follow up in 1 week or sooner if needed.

## 2016-05-09 NOTE — Patient Instructions (Addendum)
Thank you for choosing [facility].  Summary/Instructions:  Ice 2-3 times per day and after activity for about 20 minutes.  Stretches and exercises daily.  Elevate and compress.  If your symptoms worsen or fail to improve, please contact our office for further instruction, or in case of emergency go directly to the emergency room at the closest medical facility.    Generic Knee Exercises EXERCISES RANGE OF MOTION (ROM) AND STRETCHING EXERCISES These exercises may help you when beginning to rehabilitate your injury. Your symptoms may resolve with or without further involvement from your physician, physical therapist, or athletic trainer. While completing these exercises, remember:   Restoring tissue flexibility helps normal motion to return to the joints. This allows healthier, less painful movement and activity.  An effective stretch should be held for at least 30 seconds.  A stretch should never be painful. You should only feel a gentle lengthening or release in the stretched tissue. STRETCH - Knee Extension, Prone  Lie on your stomach on a firm surface, such as a bed or countertop. Place your right / left knee and leg just beyond the edge of the surface. You may wish to place a towel under the far end of your right / left thigh for comfort.  Relax your leg muscles and allow gravity to straighten your knee. Your clinician may advise you to add an ankle weight if more resistance is helpful for you.  You should feel a stretch in the back of your right / left knee. Hold this position for __________ seconds. Repeat __________ times. Complete this stretch __________ times per day. * Your physician, physical therapist, or athletic trainer may ask you to add ankle weight to enhance your stretch.  RANGE OF MOTION - Knee Flexion, Active  Lie on your back with both knees straight. (If this causes back discomfort, bend your opposite knee, placing your foot flat on the floor.)  Slowly  slide your heel back toward your buttocks until you feel a gentle stretch in the front of your knee or thigh.  Hold for __________ seconds. Slowly slide your heel back to the starting position. Repeat __________ times. Complete this exercise __________ times per day.  STRETCH - Quadriceps, Prone   Lie on your stomach on a firm surface, such as a bed or padded floor.  Bend your right / left knee and grasp your ankle. If you are unable to reach your ankle or pant leg, use a belt around your foot to lengthen your reach.  Gently pull your heel toward your buttocks. Your knee should not slide out to the side. You should feel a stretch in the front of your thigh and/or knee.  Hold this position for __________ seconds. Repeat __________ times. Complete this stretch __________ times per day.  STRETCH - Hamstrings, Supine   Lie on your back. Loop a belt or towel over the ball of your right / left foot.  Straighten your right / left knee and slowly pull on the belt to raise your leg. Do not allow the right / left knee to bend. Keep your opposite leg flat on the floor.  Raise the leg until you feel a gentle stretch behind your right / left knee or thigh. Hold this position for __________ seconds. Repeat __________ times. Complete this stretch __________ times per day.  STRENGTHENING EXERCISES These exercises may help you when beginning to rehabilitate your injury. They may resolve your symptoms with or without further involvement from your physician, physical therapist,  or Product/process development scientist. While completing these exercises, remember:   Muscles can gain both the endurance and the strength needed for everyday activities through controlled exercises.  Complete these exercises as instructed by your physician, physical therapist, or athletic trainer. Progress the resistance and repetitions only as guided.  You may experience muscle soreness or fatigue, but the pain or discomfort you are trying to  eliminate should never worsen during these exercises. If this pain does worsen, stop and make certain you are following the directions exactly. If the pain is still present after adjustments, discontinue the exercise until you can discuss the trouble with your clinician. STRENGTH - Quadriceps, Isometrics  Lie on your back with your right / left leg extended and your opposite knee bent.  Gradually tense the muscles in the front of your right / left thigh. You should see either your knee cap slide up toward your hip or increased dimpling just above the knee. This motion will push the back of the knee down toward the floor/mat/bed on which you are lying.  Hold the muscle as tight as you can without increasing your pain for __________ seconds.  Relax the muscles slowly and completely in between each repetition. Repeat __________ times. Complete this exercise __________ times per day.  STRENGTH - Quadriceps, Short Arcs   Lie on your back. Place a __________ inch towel roll under your knee so that the knee slightly bends.  Raise only your lower leg by tightening the muscles in the front of your thigh. Do not allow your thigh to rise.  Hold this position for __________ seconds. Repeat __________ times. Complete this exercise __________ times per day.  OPTIONAL ANKLE WEIGHTS: Begin with ____________________, but DO NOT exceed ____________________. Increase in 1 pound/0.5 kilogram increments.  STRENGTH - Quadriceps, Straight Leg Raises  Quality counts! Watch for signs that the quadriceps muscle is working to insure you are strengthening the correct muscles and not "cheating" by substituting with healthier muscles.  Lay on your back with your right / left leg extended and your opposite knee bent.  Tense the muscles in the front of your right / left thigh. You should see either your knee cap slide up or increased dimpling just above the knee. Your thigh may even quiver.  Tighten these muscles even  more and raise your leg 4 to 6 inches off the floor. Hold for __________ seconds.  Keeping these muscles tense, lower your leg.  Relax the muscles slowly and completely in between each repetition. Repeat __________ times. Complete this exercise __________ times per day.  STRENGTH - Hamstring, Curls  Lay on your stomach with your legs extended. (If you lay on a bed, your feet may hang over the edge.)  Tighten the muscles in the back of your thigh to bend your right / left knee up to 90 degrees. Keep your hips flat on the bed/floor.  Hold this position for __________ seconds.  Slowly lower your leg back to the starting position. Repeat __________ times. Complete this exercise __________ times per day.  OPTIONAL ANKLE WEIGHTS: Begin with ____________________, but DO NOT exceed ____________________. Increase in 1 pound/0.5 kilogram increments.  STRENGTH - Quadriceps, Squats  Stand in a door frame so that your feet and knees are in line with the frame.  Use your hands for balance, not support, on the frame.  Slowly lower your weight, bending at the hips and knees. Keep your lower legs upright so that they are parallel with the door frame. Squat only  within the range that does not increase your knee pain. Never let your hips drop below your knees.  Slowly return upright, pushing with your legs, not pulling with your hands. Repeat __________ times. Complete this exercise __________ times per day.  STRENGTH - Quadriceps, Wall Slides  Follow guidelines for form closely. Increased knee pain often results from poorly placed feet or knees.  Lean against a smooth wall or door and walk your feet out 18-24 inches. Place your feet hip-width apart.  Slowly slide down the wall or door until your knees bend __________ degrees.* Keep your knees over your heels, not your toes, and in line with your hips, not falling to either side.  Hold for __________ seconds. Stand up to rest for __________ seconds in  between each repetition. Repeat __________ times. Complete this exercise __________ times per day. * Your physician, physical therapist, or athletic trainer will alter this angle based on your symptoms and progress.   This information is not intended to replace advice given to you by your health care provider. Make sure you discuss any questions you have with your health care provider.   Document Released: 09/20/2005 Document Revised: 11/27/2014 Document Reviewed: 02/18/2009 Elsevier Interactive Patient Education [employer].

## 2016-05-17 ENCOUNTER — Other Ambulatory Visit: Payer: BLUE CROSS/BLUE SHIELD

## 2016-05-19 ENCOUNTER — Other Ambulatory Visit (INDEPENDENT_AMBULATORY_CARE_PROVIDER_SITE_OTHER): Payer: BLUE CROSS/BLUE SHIELD

## 2016-05-19 DIAGNOSIS — E1165 Type 2 diabetes mellitus with hyperglycemia: Secondary | ICD-10-CM

## 2016-05-19 DIAGNOSIS — Z794 Long term (current) use of insulin: Secondary | ICD-10-CM

## 2016-05-19 LAB — BASIC METABOLIC PANEL
BUN: 15 mg/dL (ref 6–23)
CALCIUM: 9.9 mg/dL (ref 8.4–10.5)
CO2: 29 mEq/L (ref 19–32)
Chloride: 103 mEq/L (ref 96–112)
Creatinine, Ser: 0.49 mg/dL (ref 0.40–1.20)
GFR: 136.3 mL/min (ref >60.00)
GLUCOSE: 137 mg/dL — AB (ref 70–99)
POTASSIUM: 3.8 meq/L (ref 3.5–5.1)
SODIUM: 138 meq/L (ref 135–145)

## 2016-05-19 LAB — HEMOGLOBIN A1C: HEMOGLOBIN A1C: 6.3 % (ref 4.6–6.5)

## 2016-05-22 ENCOUNTER — Ambulatory Visit (INDEPENDENT_AMBULATORY_CARE_PROVIDER_SITE_OTHER): Payer: BLUE CROSS/BLUE SHIELD | Admitting: Endocrinology

## 2016-05-22 ENCOUNTER — Encounter: Payer: Self-pay | Admitting: Endocrinology

## 2016-05-22 VITALS — BP 132/84 | HR 102 | Ht 64.0 in | Wt 248.0 lb

## 2016-05-22 DIAGNOSIS — E1165 Type 2 diabetes mellitus with hyperglycemia: Secondary | ICD-10-CM | POA: Diagnosis not present

## 2016-05-22 DIAGNOSIS — R5383 Other fatigue: Secondary | ICD-10-CM | POA: Diagnosis not present

## 2016-05-22 DIAGNOSIS — I10 Essential (primary) hypertension: Secondary | ICD-10-CM

## 2016-05-22 DIAGNOSIS — R600 Localized edema: Secondary | ICD-10-CM | POA: Diagnosis not present

## 2016-05-22 DIAGNOSIS — Z794 Long term (current) use of insulin: Secondary | ICD-10-CM

## 2016-05-22 MED ORDER — HYDROCHLOROTHIAZIDE 12.5 MG PO TABS: 12.5000 mg | ORAL_TABLET | Freq: Every day | ORAL | Status: DC

## 2016-05-22 MED ORDER — GLUCOSE BLOOD VI STRP: ORAL_STRIP | Status: DC

## 2016-05-22 NOTE — Patient Instructions (Signed)
HCTZ 12.5mg  daily  Water exercises  Check more sugars after pm meal  Watch snacks and fats

## 2016-05-22 NOTE — Progress Notes (Signed)
Patient ID: Destiny Tucker, female   DOB: 23-May-1956, 60 y.o.   MRN: [id number]   Reason for Appointment: Diabetes follow-up   History of Present Illness   Diagnosis: Type 2 DIABETES MELITUS, date of diagnosis:  08/2010     Previous history: She has been on various regimens for her diabetes including [place] in the past. Because of progressively higher fasting readings she was started on Levemir insulin in 2013 She usually tends to have relatively high fasting readings  Recent history:   Insulin regimen:  Humulin R U-500, 45 units for  brunch and 60 units at supper  Because of poor control with Levemir and Humalog she was started on Humulin R U-500 in 1/17 She was also started on Invokana 100 mg daily at that time With this her blood sugars have improved significantly and now her A1c is below 6.5  Current blood sugar patterns and problems:  She is having fairly consistent blood sugars in the mornings although checking infrequently as she has not checked with her her test strips are covered by insurance  She is however checking blood sugars after meals very infrequently and none after supper  Highest glucose 180 after her first meal yesterday  She is gaining weight  She is not exercising because of knee pain and has not considered other modalities  Continues take take Invokana with intermittent symptoms of candidiasis  She was told to try higher doses of metformin previously but is taking this only at bedtime     Oral hypoglycemic drugs: Metformin 1g hs      Side effects from medications: None       Monitors blood glucose:  0-1 times a day.    Glucometer: One Touch 2         Blood Glucose readings from meter:  Mean values apply above for all meters except median for One Touch  PRE-MEAL Fasting Lunch Dinner Bedtime Overall  Glucose range: 112-154   180     Mean/median: 132     135     breakfast 11 AM, dinner 7-8 pm; snacks at times       Physical activity:   somewhat active at workBut currently not working             Dietician visit: Most recent: At diagnosis and with nurse educator periodically      Weight control:  Wt Readings from Last 3 Encounters:  05/22/16 248 lb (112.492 kg)  05/09/16 246 lb (111.585 kg)  03/16/16 243 lb (110.224 kg)         Diabetes labs:  Lab Results  Component Value Date   HGBA1C 6.3 05/19/2016   HGBA1C 6.9* 01/10/2016   HGBA1C 6.8* 09/13/2015   Lab Results  Component Value Date   MICROALBUR 1.9 01/10/2016   LDLCALC 59 01/10/2016   CREATININE 0.49 05/19/2016      Medication List       This list is accurate as of: 05/22/16 11:59 PM.  Always use your most recent med list.               ASPIR-LOW 81 MG EC tablet  Generic drug:  aspirin  TAKE 1 TABLET BY MOUTH ONCE DAILY     B-D SINGLE USE SWABS REGULAR Pads  USE AS DIRECTED TWICE DAILY     clonazePAM 1 MG tablet  Commonly known as:  KLONOPIN  TAKE ONE TABLET BY MOUTH TWICE DAILY AS NEEDED FOR ANXIETY     Diclofenac  Sodium 2 % Soln  Commonly known as:  PENNSAID  Place 1 application onto the skin 2 (two) times daily as needed.     diphenhydrAMINE 25 MG tablet  Commonly known as:  BENADRYL  Take 25 mg by mouth every 6 (six) hours as needed.     fluconazole 150 MG tablet  Commonly known as:  DIFLUCAN  TAKE ONE TABLET BY MOUTH AS A ONE-TIME DOSE     glucose blood test strip  Use to test up to 3 times daily     hydrochlorothiazide 12.5 MG tablet  Commonly known as:  HYDRODIURIL  Take 1 tablet (12.5 mg total) by mouth daily.     Ibuprofen-Famotidine 800-26.6 MG Tabs  Take 1 tablet by mouth 3 (three) times daily as needed.     insulin regular human CONCENTRATED 500 UNIT/ML kwikpen  Commonly known as:  HUMULIN R  45 units every morning and 55 units every evening     Insulin Syringe-Needle U-100 31G X 5/16" 0.3 ML Misc  Commonly known as:  B-D INSULIN SYRINGE  Use 2 syringes daily with Levemir Insulin     INVOKANA 100 MG Tabs  tablet  Generic drug:  canagliflozin  TAKE ONE TABLET BY MOUTH ONCE DAILY BEFORE  BREAKFAST     lovastatin 20 MG tablet  Commonly known as:  MEVACOR  TAKE ONE TABLET BY MOUTH AT BEDTIME     metFORMIN 1000 MG tablet  Commonly known as:  GLUCOPHAGE  TAKE ONE TABLET BY MOUTH TWICE DAILY WITH FOOD     montelukast 10 MG tablet  Commonly known as:  SINGULAIR  Take 1 tablet (10 mg total) by mouth at bedtime.     multivitamin with minerals tablet  Take 1 tablet by mouth daily.     NOVOFINE 32G X 6 MM Misc  Generic drug:  Insulin Pen Needle  USE 3 TIMES A DAY     omega-3 fish oil 1000 MG Caps capsule  Commonly known as:  MAXEPA  Take by mouth.     ONETOUCH DELICA LANCETS 99991111 Misc  USE TO TEST UP TO 3 TIMES DAILY     sertraline 100 MG tablet  Commonly known as:  ZOLOFT  TAKE ONE TABLET BY MOUTH ONCE DAILY     TURMERIC PO  Take by mouth.        Allergies:  Allergies  Allergen Reactions  . Codeine     Past Medical History  Diagnosis Date  . OSTEOPENIA   . TOBACCO USE, QUIT   . HYPERTENSION   . HYPERLIPIDEMIA   . DEPRESSION   . ANXIETY   . ALLERGIC RHINITIS   . Diabetes mellitus ([place])   . Internal hemorrhoids without mention of complication [id number]    Colonoscopy--Dr. Fuller Plan , pts. states hemorrhoids are uncomfortable    Past Surgical History  Procedure Laterality Date  . Unilateral salpingo-oophorectomy    . Mohs surgery      Precancerous skin lesion  . Removal of fallopion tube & ovary  1986  . Removal of cyst & other ovary  09/2004  . Anus surgery      Family History  Problem Relation Age of Onset  . Hypertension Mother   . Hyperlipidemia Mother   . Nephrolithiasis Mother   . Hypertension Father   . Hyperlipidemia Father   . Nephrolithiasis Father   . Diabetes Maternal Uncle   . Diabetes Mother     Social History:  reports that she quit smoking about 6 years  ago. She does not have any smokeless tobacco history on file. She reports that she  drinks alcohol. She reports that she does not use illicit drugs.  Review of Systems:  Hypertension:  mild and was treated with Dyazide 25  Not clear why she has not taken this She thinks she has had some swelling in her legs and did not report this This is despite taking Invokana   Lipids: She is  on 20 mg  mg of lovastatin with good control of LDL   Lab Results  Component Value Date   CHOL 121 01/10/2016   HDL 32.30* 01/10/2016   LDLCALC 59 01/10/2016   LDLDIRECT 68.7 10/01/2013   TRIG 148.0 01/10/2016   CHOLHDL 4 01/10/2016       LABS:  Lab on 05/19/2016  Component Date Value Ref Range Status  . Hgb A1c MFr Bld 05/19/2016 6.3  4.6 - 6.5 % Final   Glycemic Control Guidelines for People with Diabetes:Non Diabetic:  <6%Goal of Therapy: <7%Additional Action Suggested:  >8%   . Sodium 05/19/2016 138  135 - 145 mEq/L Final  . Potassium 05/19/2016 3.8  3.5 - 5.1 mEq/L Final  . Chloride 05/19/2016 103  96 - 112 mEq/L Final  . CO2 05/19/2016 29  19 - 32 mEq/L Final  . Glucose, Bld 05/19/2016 137* 70 - 99 mg/dL Final  . BUN 05/19/2016 15  6 - 23 mg/dL Final  . Creatinine, Ser 05/19/2016 0.49  0.40 - 1.20 mg/dL Final  . Calcium 05/19/2016 9.9  8.4 - 10.5 mg/dL Final  . GFR 05/19/2016 136.30  >60.00 mL/min Final     Examination:   BP 132/84 mmHg  Pulse 102  Ht 5\' 4"  (1.626 m)  Wt 248 lb (112.492 kg)  BMI 42.55 kg/m2  SpO2 95%  Body mass index is 42.55 kg/(m^2).   Repeat blood pressure 150/90 1+ edema present    ASSESSMENT/ PLAN:   Diabetes type 2 , BMI 41    See history of present illness for detailed discussion of his current management, blood sugar patterns and problems identified She has had excellent control with A1c now 6.3 Morning sugars are averaging about 130 but is checking infrequently She has done only one or 2 readings after her first meal, highest glucose 180 No readings after evening meal Also No hypoglycemia with her taking higher doses of the U-500  insulin at supper which was increased on her last visit  Currently does need to do better with weight loss with increased exercise and discussed water aerobics She does need to watch her fat intake and snacks  She will continue same insulin doses and Invokana Encouraged her to check readings at bedtime consistently  Encouraged her to stay on Invokana and will give her Diflucan as needed, also recommended Monistat for pruritus, she needs to discuss pruritus ani with PCP  HYPERTENSION: Blood pressure is relatively higher and she is appearing to be retaining fluid She agrees to start HCTZ 12.5 mg daily but needs to have potassium monitored, currently this is below 4.0 even with Invokana    Patient Instructions  HCTZ 12.5mg  daily  Water exercises  Check more sugars after pm meal  Watch snacks and fats     Counseling time on subjects discussed above is over 50% of today's 25 minute visit   Raymond Bhardwaj 05/23/2016, 7:50 PM

## 2016-05-29 ENCOUNTER — Telehealth: Payer: Self-pay | Admitting: Emergency Medicine

## 2016-05-29 DIAGNOSIS — F32A Depression, unspecified: Secondary | ICD-10-CM

## 2016-05-29 DIAGNOSIS — F329 Major depressive disorder, single episode, unspecified: Secondary | ICD-10-CM

## 2016-05-29 NOTE — Telephone Encounter (Signed)
Patient wanted to know if she can have an increase on sertraline (ZOLOFT) 100 MG tablet. Is that possible or does she need to be seen. Please follow up thanks.

## 2016-05-30 MED ORDER — SERTRALINE HCL 100 MG PO TABS: 150.0000 mg | ORAL_TABLET | Freq: Every day | ORAL | Status: DC

## 2016-05-30 NOTE — Telephone Encounter (Signed)
That's fine we can increase to 150 mg daily before going to the max dose of 200 mg. I have also sent in a new prescription to reflect that.

## 2016-05-31 NOTE — Telephone Encounter (Signed)
LVM letting pt know that medication has been sent to pharmacy

## 2016-06-08 ENCOUNTER — Other Ambulatory Visit: Payer: Self-pay | Admitting: Endocrinology

## 2016-06-09 ENCOUNTER — Other Ambulatory Visit: Payer: Self-pay | Admitting: Endocrinology

## 2016-06-22 ENCOUNTER — Ambulatory Visit (INDEPENDENT_AMBULATORY_CARE_PROVIDER_SITE_OTHER): Payer: BLUE CROSS/BLUE SHIELD | Admitting: Psychology

## 2016-06-22 DIAGNOSIS — F331 Major depressive disorder, recurrent, moderate: Secondary | ICD-10-CM

## 2016-06-29 ENCOUNTER — Ambulatory Visit (INDEPENDENT_AMBULATORY_CARE_PROVIDER_SITE_OTHER): Payer: BLUE CROSS/BLUE SHIELD | Admitting: Psychology

## 2016-06-29 DIAGNOSIS — F331 Major depressive disorder, recurrent, moderate: Secondary | ICD-10-CM | POA: Diagnosis not present

## 2016-07-13 ENCOUNTER — Ambulatory Visit: Payer: BLUE CROSS/BLUE SHIELD | Admitting: Psychology

## 2016-07-27 ENCOUNTER — Telehealth: Payer: Self-pay | Admitting: Family

## 2016-07-27 NOTE — Telephone Encounter (Signed)
Pt request to speak to someone concern about billing that was coded wrong. Please give her a call back

## 2016-07-28 NOTE — Telephone Encounter (Signed)
Patient states she has called because she has received another bill.  States that she had an issue that was suppose to be resolved but received another bill.

## 2016-07-31 NOTE — Telephone Encounter (Signed)
Called and spoke with patient, she only has  $26.76 patient balance on her account from previous visits with Dr. Dwyane Dee. Looks like billing corrected any claims she had with our office, pt understood.

## 2016-08-09 ENCOUNTER — Telehealth: Payer: Self-pay | Admitting: Gastroenterology

## 2016-08-09 ENCOUNTER — Other Ambulatory Visit: Payer: Self-pay | Admitting: Endocrinology

## 2016-08-09 NOTE — Telephone Encounter (Signed)
Discussed various things to element from her diet to see if it helps. She is an insulin dependent diabetic with good glycemic control. She has also been on Metformin for years. No antibiotics in the past several months. No sick contacts. Offered a sooner appointmet but she declines.

## 2016-08-21 ENCOUNTER — Ambulatory Visit (INDEPENDENT_AMBULATORY_CARE_PROVIDER_SITE_OTHER): Payer: BLUE CROSS/BLUE SHIELD | Admitting: Psychology

## 2016-08-21 DIAGNOSIS — F331 Major depressive disorder, recurrent, moderate: Secondary | ICD-10-CM | POA: Diagnosis not present

## 2016-08-28 ENCOUNTER — Ambulatory Visit: Payer: Self-pay | Admitting: Physician Assistant

## 2016-09-04 ENCOUNTER — Ambulatory Visit (INDEPENDENT_AMBULATORY_CARE_PROVIDER_SITE_OTHER): Payer: BLUE CROSS/BLUE SHIELD | Admitting: Psychology

## 2016-09-04 DIAGNOSIS — F331 Major depressive disorder, recurrent, moderate: Secondary | ICD-10-CM | POA: Diagnosis not present

## 2016-09-06 ENCOUNTER — Other Ambulatory Visit: Payer: Self-pay | Admitting: Family

## 2016-09-06 NOTE — Telephone Encounter (Signed)
Last refill was 05/05/16

## 2016-09-07 NOTE — Telephone Encounter (Signed)
rx faxed

## 2016-09-19 ENCOUNTER — Ambulatory Visit (INDEPENDENT_AMBULATORY_CARE_PROVIDER_SITE_OTHER): Payer: BLUE CROSS/BLUE SHIELD | Admitting: Psychology

## 2016-09-19 DIAGNOSIS — F331 Major depressive disorder, recurrent, moderate: Secondary | ICD-10-CM | POA: Diagnosis not present

## 2016-10-02 ENCOUNTER — Other Ambulatory Visit (INDEPENDENT_AMBULATORY_CARE_PROVIDER_SITE_OTHER): Payer: BLUE CROSS/BLUE SHIELD

## 2016-10-02 DIAGNOSIS — E1165 Type 2 diabetes mellitus with hyperglycemia: Secondary | ICD-10-CM

## 2016-10-02 DIAGNOSIS — R5383 Other fatigue: Secondary | ICD-10-CM | POA: Diagnosis not present

## 2016-10-02 DIAGNOSIS — Z794 Long term (current) use of insulin: Secondary | ICD-10-CM

## 2016-10-02 LAB — COMPREHENSIVE METABOLIC PANEL
ALBUMIN: 4.1 g/dL (ref 3.5–5.2)
ALK PHOS: 81 U/L (ref 39–117)
ALT: 26 U/L (ref 0–35)
AST: 19 U/L (ref 0–37)
BUN: 14 mg/dL (ref 6–23)
CALCIUM: 9.7 mg/dL (ref 8.4–10.5)
CO2: 31 mEq/L (ref 19–32)
CREATININE: 0.51 mg/dL (ref 0.40–1.20)
Chloride: 100 mEq/L (ref 96–112)
GFR: 129.99 mL/min (ref >60.00)
Glucose, Bld: 107 mg/dL — ABNORMAL HIGH (ref 70–99)
Potassium: 3.7 mEq/L (ref 3.5–5.1)
SODIUM: 138 meq/L (ref 135–145)
TOTAL PROTEIN: 7.5 g/dL (ref 6.0–8.3)
Total Bilirubin: 0.4 mg/dL (ref 0.2–1.2)

## 2016-10-02 LAB — HEMOGLOBIN A1C: HEMOGLOBIN A1C: 6.4 % (ref 4.6–6.5)

## 2016-10-02 LAB — TSH: TSH: 1.75 u[IU]/mL (ref 0.35–4.50)

## 2016-10-02 LAB — HM MAMMOGRAPHY

## 2016-10-05 ENCOUNTER — Other Ambulatory Visit: Payer: Self-pay

## 2016-10-05 ENCOUNTER — Encounter: Payer: Self-pay | Admitting: Family

## 2016-10-05 ENCOUNTER — Ambulatory Visit (INDEPENDENT_AMBULATORY_CARE_PROVIDER_SITE_OTHER): Payer: BLUE CROSS/BLUE SHIELD | Admitting: Endocrinology

## 2016-10-05 ENCOUNTER — Other Ambulatory Visit: Payer: Self-pay | Admitting: Endocrinology

## 2016-10-05 ENCOUNTER — Encounter: Payer: Self-pay | Admitting: Endocrinology

## 2016-10-05 VITALS — BP 128/82 | HR 87 | Wt 250.0 lb

## 2016-10-05 DIAGNOSIS — Z23 Encounter for immunization: Secondary | ICD-10-CM

## 2016-10-05 DIAGNOSIS — Z794 Long term (current) use of insulin: Secondary | ICD-10-CM

## 2016-10-05 DIAGNOSIS — E1165 Type 2 diabetes mellitus with hyperglycemia: Secondary | ICD-10-CM

## 2016-10-05 MED ORDER — GLUCOSE BLOOD VI STRP: ORAL_STRIP | 1 refills | Status: DC

## 2016-10-05 NOTE — Progress Notes (Addendum)
Patient ID: Destiny Tucker, female   DOB: Apr 14, 1956, 60 y.o.   MRN: [id number]   Reason for Appointment: Diabetes follow-up   History of Present Illness   Diagnosis: Type 2 DIABETES MELITUS, date of diagnosis:  08/2010     Previous history: She has been on various regimens for her diabetes including [place] in the past. Because of progressively higher fasting readings she was started on Levemir insulin in 2013 She usually tends to have relatively high fasting readings  Recent history:   Insulin regimen:  Humulin R U-500, 45 units for  brunch and 60 units at supper Oral hypoglycemic drugs: Metformin 1g hs, Invokana 100 mg daily    Because of poor control with Levemir and Humalog she was started on Humulin R U-500 in 1/17 She was also started on Invokana 100 mg daily at that time  With this her blood sugars have improved significantly and now her A1c is consistently below 6.5  Current blood sugar patterns and problems:  She is having somewhat higher readings mornings although not consistently  Blood sugars later in the day and evening are fairly good but has only sporadic readings  She says she is using her husband's test strips and does not check often  She did have an episode of hypoglycemia in the afternoon when she had less carbohydrate and did not eat on time, she treated this with a candy bar  She is gaining weight slowly  She is not exercising because of knee pain      Side effects from medications: None       Monitors blood glucose:  0-1 times a day.    Glucometer: One Touch 2         Blood Glucose readings from meter, has only 8 readings in the last month:  Mean values apply above for all meters except median for One Touch  PRE-MEAL Fasting 3 PM  Dinner Bedtime Overall  Glucose range: 141-199  120  124, 143  139    Mean/median: 171    142    Breakfast 11 AM, dinner 7-8 pm; snacks at times        Physical activiity: limited by knee pain             Dietician visit: Most recent: At diagnosis and with nurse educator periodically      Weight control:  Wt Readings from Last 3 Encounters:  10/05/16 250 lb (113.4 kg)  05/22/16 248 lb (112.5 kg)  05/09/16 246 lb (111.6 kg)         Diabetes labs:  Lab Results  Component Value Date   HGBA1C 6.4 10/02/2016   HGBA1C 6.3 05/19/2016   HGBA1C 6.9 (H) 01/10/2016   Lab Results  Component Value Date   MICROALBUR 1.9 01/10/2016   LDLCALC 59 01/10/2016   CREATININE 0.51 10/02/2016      Medication List       Accurate as of 10/05/16  3:12 PM. Always use your most recent med list.          ASPIR-LOW 81 MG EC tablet Generic drug:  aspirin TAKE 1 TABLET BY MOUTH ONCE DAILY   B-D SINGLE USE SWABS REGULAR Pads USE AS DIRECTED TWICE DAILY   clonazePAM 1 MG tablet Commonly known as:  KLONOPIN TAKE ONE TABLET BY MOUTH TWICE DAILY AS NEEDED FOR ANXIETY   Diclofenac Sodium 2 % Soln Commonly known as:  PENNSAID Place 1 application onto the skin 2 (two) times daily as  needed.   diphenhydrAMINE 25 MG tablet Commonly known as:  BENADRYL Take 25 mg by mouth every 6 (six) hours as needed.   fluconazole 150 MG tablet Commonly known as:  DIFLUCAN TAKE ONE TABLET BY MOUTH AS A ONE-TIME DOSE   glucose blood test strip Use to test up to 3 times daily   hydrochlorothiazide 12.5 MG tablet Commonly known as:  HYDRODIURIL Take 1 tablet (12.5 mg total) by mouth daily.   Ibuprofen-Famotidine 800-26.6 MG Tabs Take 1 tablet by mouth 3 (three) times daily as needed.   insulin regular human CONCENTRATED 500 UNIT/ML kwikpen Commonly known as:  HUMULIN R 45 units every morning and 55 units every evening   Insulin Syringe-Needle U-100 31G X 5/16" 0.3 ML Misc Commonly known as:  B-D INSULIN SYRINGE Use 2 syringes daily with Levemir Insulin   INVOKANA 100 MG Tabs tablet Generic drug:  canagliflozin TAKE ONE TABLET BY MOUTH ONCE DAILY BEFORE  BREAKFAST   lovastatin 20 MG tablet Commonly  known as:  MEVACOR TAKE ONE TABLET BY MOUTH AT BEDTIME   metFORMIN 1000 MG tablet Commonly known as:  GLUCOPHAGE TAKE ONE TABLET BY MOUTH TWICE DAILY WITH FOOD   montelukast 10 MG tablet Commonly known as:  SINGULAIR Take 1 tablet (10 mg total) by mouth at bedtime.   multivitamin with minerals tablet Take 1 tablet by mouth daily.   NOVOFINE 32G X 6 MM Misc Generic drug:  Insulin Pen Needle USE 3 TIMES A DAY   omega-3 fish oil 1000 MG Caps capsule Commonly known as:  MAXEPA Take by mouth.   ONETOUCH DELICA LANCETS 99991111 Misc USE TO TEST UP TO 3 TIMES DAILY   sertraline 100 MG tablet Commonly known as:  ZOLOFT Take 1.5 tablets (150 mg total) by mouth daily.   TURMERIC PO Take by mouth.       Allergies:  Allergies  Allergen Reactions  . Codeine     Past Medical History:  Diagnosis Date  . ALLERGIC RHINITIS   . ANXIETY   . DEPRESSION   . Diabetes mellitus ([place])   . HYPERLIPIDEMIA   . HYPERTENSION   . Internal hemorrhoids without mention of complication [id number]   Colonoscopy--Dr. Fuller Plan , pts. states hemorrhoids are uncomfortable  . OSTEOPENIA   . TOBACCO USE, QUIT     Past Surgical History:  Procedure Laterality Date  . ANUS SURGERY    . MOHS SURGERY     Precancerous skin lesion  . Removal of cyst & other ovary  09/2004  . Removal of fallopion tube & ovary  1986  . Unilateral Salpingo-Oophorectomy      Family History  Problem Relation Age of Onset  . Hypertension Mother   . Hyperlipidemia Mother   . Nephrolithiasis Mother   . Hypertension Father   . Hyperlipidemia Father   . Nephrolithiasis Father   . Diabetes Maternal Uncle   . Diabetes Mother     Social History:  reports that she quit smoking about 6 years ago. She does not have any smokeless tobacco history on file. She reports that she drinks alcohol. She reports that she does not use drugs.  Review of Systems:  Hypertension:  Now treated with HCTZ only because of tendency to  edema Also on Invokana She thinks her swelling is usually better  Lipids: She is  on 20 mg  mg of lovastatin with good control of LDL   Lab Results  Component Value Date   CHOL 121 01/10/2016   HDL  32.30 (L) 01/10/2016   LDLCALC 59 01/10/2016   LDLDIRECT 68.7 10/01/2013   TRIG 148.0 01/10/2016   CHOLHDL 4 01/10/2016       LABS:  Lab on 10/02/2016  Component Date Value Ref Range Status  . Sodium 10/02/2016 138  135 - 145 mEq/L Final  . Potassium 10/02/2016 3.7  3.5 - 5.1 mEq/L Final  . Chloride 10/02/2016 100  96 - 112 mEq/L Final  . CO2 10/02/2016 31  19 - 32 mEq/L Final  . Glucose, Bld 10/02/2016 107* 70 - 99 mg/dL Final  . BUN 10/02/2016 14  6 - 23 mg/dL Final  . Creatinine, Ser 10/02/2016 0.51  0.40 - 1.20 mg/dL Final  . Total Bilirubin 10/02/2016 0.4  0.2 - 1.2 mg/dL Final  . Alkaline Phosphatase 10/02/2016 81  39 - 117 U/L Final  . AST 10/02/2016 19  0 - 37 U/L Final  . ALT 10/02/2016 26  0 - 35 U/L Final  . Total Protein 10/02/2016 7.5  6.0 - 8.3 g/dL Final  . Albumin 10/02/2016 4.1  3.5 - 5.2 g/dL Final  . Calcium 10/02/2016 9.7  8.4 - 10.5 mg/dL Final  . GFR 10/02/2016 129.99  >60.00 mL/min Final  . Hgb A1c MFr Bld 10/02/2016 6.4  4.6 - 6.5 % Final  . TSH 10/02/2016 1.75  0.35 - 4.50 uIU/mL Final     Examination:   BP 128/82   Pulse 87   Wt 250 lb (113.4 kg)   SpO2 97%   BMI 42.91 kg/m   Body mass index is 42.91 kg/m.   1+ left lower leg edema present, none on the right  ASSESSMENT/ PLAN:   Diabetes type 2 , BMI 43    See history of present illness for detailed discussion of current management, blood sugar patterns and problems identified She has had excellent control with A1c now 6.4  Morning sugars are inconsistent now and a couple of times in the 190s This is despite not having any high readings later in the day, however is checking blood sugar very sporadically and this was discussed Since she may have a possibility of getting low sugars with  increasing supper diabetes: We can try increasing her metformin by at least 500 mg at night Discussed that she is gaining weight and she needs to get her arthritis in the knees treated so she can exercise better She will continue same insulin doses and Invokana Encouraged her to check readings at bedtime consistently She was also tried to get her own test strips   HYPERTENSION: Blood pressure better with HCTZ and potassium is okay    There are no Patient Instructions on file for this visit.   Kymani Shimabukuro 10/05/2016, 3:12 PM

## 2016-10-05 NOTE — Addendum Note (Signed)
Addended by: Nile Riggs on: 10/05/2016 04:19 PM   Modules accepted: Orders

## 2016-10-05 NOTE — Patient Instructions (Signed)
Check blood sugars on waking up  3x weekly  Also check blood sugars about 2 hours after a meal and do this after different meals by rotation  Recommended blood sugar levels on waking up is 90-130 and about 2 hours after meal is 130-160  Please bring your blood sugar monitor to each visit, thank you  Take 1 1/2 Metformin at [facility]

## 2016-10-05 NOTE — Addendum Note (Signed)
Addended by: Elayne Snare on: 10/05/2016 04:28 PM   Modules accepted: Orders

## 2016-10-06 ENCOUNTER — Telehealth: Payer: Self-pay | Admitting: Endocrinology

## 2016-10-06 NOTE — Telephone Encounter (Signed)
Pt called and needs her needles and test strips sent to the [facility] on [address].

## 2016-10-09 ENCOUNTER — Ambulatory Visit: Payer: BLUE CROSS/BLUE SHIELD | Admitting: Family

## 2016-10-09 MED ORDER — INSULIN PEN NEEDLE 32G X 6 MM MISC: 2 refills | Status: DC

## 2016-10-09 NOTE — Telephone Encounter (Signed)
Refill submitted per patient's request.  

## 2016-10-10 NOTE — Telephone Encounter (Signed)
This rx was sent to the wrong pharmacy originally, please send the test strips and needles to [facility]

## 2016-10-11 LAB — HM DIABETES EYE EXAM

## 2016-10-16 ENCOUNTER — Telehealth: Payer: Self-pay | Admitting: Endocrinology

## 2016-10-16 NOTE — Telephone Encounter (Signed)
error 

## 2016-10-16 NOTE — Telephone Encounter (Signed)
And glucose blood test strip  Send to [place], [place] [phone] (Phone) [phone] (Fax)

## 2016-10-16 NOTE — Telephone Encounter (Signed)
Pt needs the strips and needles to the [facility] on [address] please

## 2016-10-16 NOTE — Telephone Encounter (Signed)
Refill of  Insulin Syringe-Needle U-100 (B-D INSULIN SYRINGE) 31G X 5/16" 0.3 ML [place], [place] - [place] [phone] (Phone) [phone] (Fax)

## 2016-10-17 ENCOUNTER — Ambulatory Visit: Payer: BLUE CROSS/BLUE SHIELD | Admitting: Family

## 2016-10-17 ENCOUNTER — Other Ambulatory Visit: Payer: Self-pay

## 2016-10-17 MED ORDER — "INSULIN SYRINGE-NEEDLE U-100 31G X 5/16"" 0.3 ML MISC": 3 refills | Status: DC

## 2016-10-17 MED ORDER — GLUCOSE BLOOD VI STRP: ORAL_STRIP | 1 refills | Status: DC

## 2016-10-17 MED ORDER — ONETOUCH DELICA LANCETS 33G MISC: 1 refills | Status: DC

## 2016-10-17 NOTE — Telephone Encounter (Signed)
Ordered 10/17/16

## 2016-10-18 ENCOUNTER — Other Ambulatory Visit: Payer: Self-pay

## 2016-10-18 MED ORDER — INSULIN PEN NEEDLE 32G X 6 MM MISC: 2 refills | Status: AC

## 2016-10-20 ENCOUNTER — Ambulatory Visit (INDEPENDENT_AMBULATORY_CARE_PROVIDER_SITE_OTHER): Payer: BLUE CROSS/BLUE SHIELD | Admitting: Family

## 2016-10-20 ENCOUNTER — Encounter: Payer: Self-pay | Admitting: Family

## 2016-10-20 VITALS — BP 126/84 | HR 94 | Temp 97.8°F | Ht 64.0 in | Wt 249.6 lb

## 2016-10-20 DIAGNOSIS — G8929 Other chronic pain: Secondary | ICD-10-CM | POA: Diagnosis not present

## 2016-10-20 DIAGNOSIS — M25561 Pain in right knee: Secondary | ICD-10-CM | POA: Diagnosis not present

## 2016-10-20 NOTE — Assessment & Plan Note (Signed)
Right knee pain concerning for worsening osteoarthritis and tricompartmental degenerative changes. Previous cortisone injection with minimal improvements. Discussed additional treatments including possible cartilage supplementation and physical therapy. Referral placed for physical therapy and patient will check on price of Synvisc or Euflexxa. Continue conservative treatment pending physical therapy.

## 2016-10-20 NOTE — Progress Notes (Signed)
Subjective:    Patient ID: Destiny Tucker, female    DOB: Jun 14, 1956, 60 y.o.   MRN: [id number]  Chief Complaint  Patient presents with  . Knee Pain    R knee pain increasing    HPI:  Destiny Tucker is a 60 y.o. female who  has a past medical history of ALLERGIC RHINITIS; ANXIETY; DEPRESSION; Diabetes mellitus ([place]); HYPERLIPIDEMIA; HYPERTENSION; Internal hemorrhoids without mention of complication (123456); OSTEOPENIA; and TOBACCO USE, QUIT. and presents today for a follow up office visit.   Previously diagnosed with right knee pain with concern for osteoarthritis with x-ray confirmation showing tricompartmental degenerative changes. Previously completed a cortisone injection approximately one year ago which lasted approximately one week. Currently working on conservative treatment with home exercise therapy, ice, compression, and elevation. Patient reports compliant with treatment regimen. Her weight has remained stable since previous office visit. She continues to experience associated symptom of pain located in her right knee that is described as sharp at times and generally as dull and achy. Denies any new trauma or injury.    Allergies  Allergen Reactions  . Codeine     Current Outpatient Prescriptions  Medication Sig Dispense Refill  . Alcohol Swabs (B-D SINGLE USE SWABS REGULAR) PADS USE AS DIRECTED TWICE DAILY 100 each 9  . clonazePAM (KLONOPIN) 1 MG tablet TAKE ONE TABLET BY MOUTH TWICE DAILY AS NEEDED FOR ANXIETY 180 tablet 0  . diphenhydrAMINE (BENADRYL) 25 MG tablet Take 25 mg by mouth every 6 (six) hours as needed.    [name] glucose blood test strip Use to test up to 3 times daily 300 each 1  . hydrochlorothiazide (HYDRODIURIL) 12.5 MG tablet Take 1 tablet (12.5 mg total) by mouth daily. 30 tablet 5  . Ibuprofen-Famotidine 800-26.6 MG TABS Take 1 tablet by mouth 3 (three) times daily as needed. 90 tablet 1  . Insulin Pen Needle (NOVOFINE) 32G X 6 MM MISC Use to inject 2  times per day 300 each 2  . insulin regular human CONCENTRATED (HUMULIN R) 500 UNIT/ML kwikpen 45 units every morning and 55 units every evening (Patient taking differently: 45 units every morning and 60 units every evening) 2 pen 3  . Insulin Syringe-Needle U-100 (B-D INSULIN SYRINGE) 31G X 5/16" 0.3 ML MISC Use 2 syringes daily with Levemir Insulin 100 each 3  . INVOKANA 100 MG TABS tablet TAKE ONE TABLET BY MOUTH ONCE DAILY BEFORE  BREAKFAST 30 tablet 1  . lovastatin (MEVACOR) 20 MG tablet TAKE ONE TABLET BY MOUTH AT BEDTIME 90 tablet 1  . metFORMIN (GLUCOPHAGE) 1000 MG tablet TAKE ONE TABLET BY MOUTH TWICE DAILY WITH FOOD 180 tablet 0  . montelukast (SINGULAIR) 10 MG tablet Take 1 tablet (10 mg total) by mouth at bedtime. 30 tablet 3  . Multiple Vitamins-Minerals (MULTIVITAMIN WITH MINERALS) tablet Take 1 tablet by mouth daily.    [name] omega-3 fish oil (MAXEPA) 1000 MG CAPS capsule Take by mouth.    [name] DELICA LANCETS 99991111 MISC USE TO TEST UP TO 3 TIMES DAILY 300 each 1  . sertraline (ZOLOFT) 100 MG tablet Take 1.5 tablets (150 mg total) by mouth daily. 45 tablet 1  . TURMERIC PO Take by mouth.    . ASPIR-LOW 81 MG EC tablet TAKE 1 TABLET BY MOUTH ONCE DAILY (Patient not taking: Reported on 10/20/2016) 90 tablet 3  . Diclofenac Sodium (PENNSAID) 2 % SOLN Place 1 application onto the skin 2 (two) times daily as needed. (Patient not  taking: Reported on 10/20/2016) 112 g 1  . fluconazole (DIFLUCAN) 150 MG tablet TAKE ONE TABLET BY MOUTH AS A ONE-TIME DOSE (Patient not taking: Reported on 10/20/2016) 1 tablet 0   No current facility-administered medications for this visit.     Review of Systems  Constitutional: Negative for chills and fever.  Neurological: Negative for seizures and weakness.      Objective:    BP 126/84 (BP Location: Left Arm, Patient Position: Sitting, Cuff Size: Large)   Pulse 94   Temp 97.8 F (36.6 C) (Oral)   Ht 5\' 4"  (1.626 m)   Wt 249 lb 9.6 oz (113.2 kg)    SpO2 96%   BMI 42.84 kg/m  Nursing note and vital signs reviewed.  Physical Exam  Constitutional: She is oriented to person, place, and time. She appears well-developed and well-nourished. No distress.  Cardiovascular: Normal rate, regular rhythm, normal heart sounds and intact distal pulses.   Pulmonary/Chest: Effort normal and breath sounds normal.  Musculoskeletal:  Right knee -no obvious deformity or discoloration with mild/moderate edema. There is medial/lateral joint line pain and some tenderness remains in the popliteal fossa. Range of motion is within normal limits with some discomfort with active range of motion. Ligamentous and meniscal testing are negative. Distal pulses and sensation are intact and appropriate.  Neurological: She is alert and oriented to person, place, and time.  Skin: Skin is warm and dry.  Psychiatric: She has a normal mood and affect. Her behavior is normal. Judgment and thought content normal.       Assessment & Plan:   Problem List Items Addressed This Visit      Other   Right knee pain - Primary    Right knee pain concerning for worsening osteoarthritis and tricompartmental degenerative changes. Previous cortisone injection with minimal improvements. Discussed additional treatments including possible cartilage supplementation and physical therapy. Referral placed for physical therapy and patient will check on price of Synvisc or Euflexxa. Continue conservative treatment pending physical therapy.      Relevant Orders   Ambulatory referral to Physical Therapy      I am having Ms. Darling maintain her ASPIR-LOW, B-D SINGLE USE SWABS REGULAR, multivitamin with minerals, TURMERIC PO, omega-3 fish oil, diphenhydrAMINE, insulin regular human CONCENTRATED, montelukast, Diclofenac Sodium, Ibuprofen-Famotidine, lovastatin, metFORMIN, hydrochlorothiazide, sertraline, fluconazole, clonazePAM, INVOKANA, glucose blood, Insulin Syringe-Needle 0000000, ONETOUCH DELICA  LANCETS [id number], and Insulin Pen Needle.   Follow-up: Return if symptoms worsen or fail to improve.  Mauricio Po, FNP

## 2016-10-20 NOTE — Patient Instructions (Addendum)
Thank you for choosing [facility].  SUMMARY AND INSTRUCTIONS:  Please check the price of Synvisc and Euflexxa.   They will call to schedule your appointment with physical   Medication:  Your prescription(s) have been submitted to your pharmacy or been printed and provided for you. Please take as directed and contact our office if you believe you are having problem(s) with the medication(s) or have any questions.  Follow up:  If your symptoms worsen or fail to improve, please contact our office for further instruction, or in case of emergency go directly to the emergency room at the closest medical facility.     Osteoarthritis Osteoarthritis is a type of arthritis that affects tissue that covers the ends of bones in joints (cartilage). Cartilage acts as a cushion between the bones and helps them move smoothly. Osteoarthritis results when cartilage in the joints gets worn down. Osteoarthritis is sometimes called "wear and tear" arthritis. Osteoarthritis is the most common form of arthritis. It often occurs in older people. It is a condition that gets worse over time (a progressive condition). Joints that are most often affected by this condition are in:  Fingers.  Toes.  Hips.  Knees.  Spine, including neck and lower back. What are the causes? This condition is caused by age-related wearing down of cartilage that covers the ends of bones. What increases the risk? The following factors may make you more likely to develop this condition:  Older age.  Being overweight or obese.  Overuse of joints, such as in athletes.  Past injury of a joint.  Past surgery on a joint.  Family history of osteoarthritis. What are the signs or symptoms? The main symptoms of this condition are pain, swelling, and stiffness in the joint. The joint may lose its shape over time. Small pieces of bone or cartilage may break off and float inside of the joint, which may cause more pain and  damage to the joint. Small deposits of bone (osteophytes) may grow on the edges of the joint. Other symptoms may include:  A grating or scraping feeling inside the joint when you move it.  Popping or creaking sounds when you move. Symptoms may affect one or more joints. Osteoarthritis in a major joint, such as your knee or hip, can make it painful to walk or exercise. If you have osteoarthritis in your hands, you might not be able to grip items, twist your hand, or control small movements of your hands and fingers (fine motor skills). How is this diagnosed? This condition may be diagnosed based on:  Your medical history.  A physical exam.  Your symptoms.  X-rays of the affected joint(s).  Blood tests to rule out other types of arthritis. How is this treated? There is no cure for this condition, but treatment can help to control pain and improve joint function. Treatment plans may include:  A prescribed exercise program that allows for rest and joint relief. You may work with a physical therapist.  A weight control plan.  Pain relief techniques, such as:  Applying heat and cold to the joint.  Electric pulses delivered to nerve endings under the skin (transcutaneous electrical nerve stimulation, or TENS).  Massage.  Certain nutritional supplements.  NSAIDs or prescription medicines to help relieve pain.  Medicine to help relieve pain and inflammation (corticosteroids). This can be given by mouth (orally) or as an injection.  Assistive devices, such as a brace, wrap, splint, specialized glove, or cane.  Surgery, such as:  An osteotomy. This is done to reposition the bones and relieve pain or to remove loose pieces of bone and cartilage.  Joint replacement surgery. You may need this surgery if you have very bad (advanced) osteoarthritis. Follow these instructions at home: Activity  Rest your affected joints as directed by your health care provider.  Do not drive or use  heavy machinery while taking prescription pain medicine.  Exercise as directed. Your health care provider or physical therapist may recommend specific types of exercise, such as:  Strengthening exercises. These are done to strengthen the muscles that support joints that are affected by arthritis. They can be performed with weights or with exercise bands to add resistance.  Aerobic activities. These are exercises, such as brisk walking or water aerobics, that get your heart pumping.  Range-of-motion activities. These keep your joints easy to move.  Balance and agility exercises. Managing pain, stiffness, and swelling  If directed, apply heat to the affected area as often as told by your health care provider. Use the heat source that your health care provider recommends, such as a moist heat pack or a heating pad.  If you have a removable assistive device, remove it as told by your health care provider.  Place a towel between your skin and the heat source. If your health care provider tells you to keep the assistive device on while you apply heat, place a towel between the assistive device and the heat source.  Leave the heat on for 20-30 minutes.  Remove the heat if your skin turns bright red. This is especially important if you are unable to feel pain, heat, or cold. You may have a greater risk of getting burned.  If directed, put ice on the affected joint:  If you have a removable assistive device, remove it as told by your health care provider.  Put ice in a plastic bag.  Place a towel between your skin and the bag. If your health care provider tells you to keep the assistive device on during icing, place a towel between the assistive device and the bag.  Leave the ice on for 20 minutes, 2-3 times a day. General instructions  Take over-the-counter and prescription medicines only as told by your health care provider.  Maintain a healthy weight. Follow instructions from your  health care provider for weight control. These may include dietary restrictions.  Do not use any products that contain nicotine or tobacco, such as cigarettes and e-cigarettes. These can delay bone healing. If you need help quitting, ask your health care provider.  Use assistive devices as directed by your health care provider.  Keep all follow-up visits as told by your health care provider. This is important. Where to find more information:  [employer] of Arthritis and Musculoskeletal and Skin Diseases: www.niams.SouthExposed.es  [employer] on Aging: http://kim-miller.com/  American College of Rheumatology: www.rheumatology.org Contact a health care provider if:  Your skin turns red.  You develop a rash.  You have pain that gets worse.  You have a fever along with joint or muscle aches. Get help right away if:  You lose a lot of weight.  You suddenly lose your appetite.  You have night sweats. Summary  Osteoarthritis is a type of arthritis that affects tissue covering the ends of bones in joints (cartilage).  This condition is caused by age-related wearing down of cartilage that covers the ends of bones.  The main symptom of this condition is pain, swelling, and stiffness in  the joint.  There is no cure for this condition, but treatment can help to control pain and improve joint function. This information is not intended to replace advice given to you by your health care provider. Make sure you discuss any questions you have with your health care provider. Document Released: 11/06/2005 Document Revised: 07/10/2016 Document Reviewed: 07/10/2016 Elsevier Interactive Patient Education  2017 [employer].

## 2016-10-21 ENCOUNTER — Other Ambulatory Visit: Payer: Self-pay | Admitting: Endocrinology

## 2016-10-21 ENCOUNTER — Other Ambulatory Visit: Payer: Self-pay | Admitting: Family

## 2016-10-21 DIAGNOSIS — F32A Depression, unspecified: Secondary | ICD-10-CM

## 2016-10-21 DIAGNOSIS — F329 Major depressive disorder, single episode, unspecified: Secondary | ICD-10-CM

## 2016-10-24 ENCOUNTER — Telehealth: Payer: Self-pay | Admitting: Family

## 2016-10-24 DIAGNOSIS — M1711 Unilateral primary osteoarthritis, right knee: Secondary | ICD-10-CM

## 2016-10-24 NOTE — Telephone Encounter (Signed)
Patient called in stating that [name] wanted her to get in touch with her insurance to check on an injection coverage.  Patient states she can not remember name of injection.  Patient states she did call her insurance company and [employer] states that our office needs to call them b/c they are needing codes.  Patient states she is trying to get this done before the end of year.  Please follow up in regard.

## 2016-10-25 DIAGNOSIS — M1710 Unilateral primary osteoarthritis, unspecified knee: Secondary | ICD-10-CM | POA: Insufficient documentation

## 2016-10-25 DIAGNOSIS — M179 Osteoarthritis of knee, unspecified: Secondary | ICD-10-CM | POA: Insufficient documentation

## 2016-10-25 MED ORDER — SODIUM HYALURONATE (VISCOSUP) 20 MG/2ML IX SOSY: 20.0000 mg | PREFILLED_SYRINGE | INTRA_ARTICULAR | 0 refills | Status: DC

## 2016-10-25 NOTE — Telephone Encounter (Signed)
Returned pts call. No answer. LVM for pt to call back.  

## 2016-10-25 NOTE — Telephone Encounter (Signed)
Please call patient back.  Patient states she thought this was an injection she was to come into the office for.  Patient states that her pharmacy has already called stating that she might have to go through a special pharmacy in [place] or that [employer] might want her to go through a different pharmacy.

## 2016-10-25 NOTE — Telephone Encounter (Signed)
Medication order sent and will await prior-authorization if necessary.

## 2016-10-25 NOTE — Telephone Encounter (Signed)
Patient returned phone call. Please call tomorrow after 10am.

## 2016-10-26 NOTE — Telephone Encounter (Signed)
Called pt back. She is checking with insurance about synvisc injection and calling back to let us know if its covered with her insurance

## 2016-10-26 NOTE — Telephone Encounter (Signed)
Call patient back once you get a chance.

## 2016-10-30 ENCOUNTER — Ambulatory Visit (INDEPENDENT_AMBULATORY_CARE_PROVIDER_SITE_OTHER): Payer: BLUE CROSS/BLUE SHIELD | Admitting: Family

## 2016-10-30 ENCOUNTER — Telehealth: Payer: Self-pay | Admitting: Endocrinology

## 2016-10-30 ENCOUNTER — Encounter: Payer: Self-pay | Admitting: Endocrinology

## 2016-10-30 ENCOUNTER — Encounter: Payer: Self-pay | Admitting: Family

## 2016-10-30 ENCOUNTER — Other Ambulatory Visit: Payer: Self-pay

## 2016-10-30 VITALS — BP 124/84 | HR 93 | Temp 97.9°F | Resp 16 | Ht 64.0 in | Wt 252.0 lb

## 2016-10-30 DIAGNOSIS — M1711 Unilateral primary osteoarthritis, right knee: Secondary | ICD-10-CM

## 2016-10-30 MED ORDER — GLUCOSE BLOOD VI STRP: ORAL_STRIP | 5 refills | Status: DC

## 2016-10-30 MED ORDER — BAYER CONTOUR NEXT MONITOR W/DEVICE KIT: PACK | 0 refills | Status: DC

## 2016-10-30 NOTE — Telephone Encounter (Signed)
What prescription?

## 2016-10-30 NOTE — Progress Notes (Signed)
Subjective:    Patient ID: Destiny Tucker, female    DOB: April 23, 1956, 59 y.o.   MRN: [id number]  Chief Complaint  Patient presents with  . knee injection    HPI:  Destiny Tucker is a 60 y.o. female who  has a past medical history of ALLERGIC RHINITIS; ANXIETY; DEPRESSION; Diabetes mellitus ([place]); HYPERLIPIDEMIA; HYPERTENSION; Internal hemorrhoids without mention of complication ([id number]); OSTEOPENIA; and TOBACCO USE, QUIT. and presents today for a follow up office visit.   Recently evaluated in the office for tricompartmental degenerative changes on x-ray and osteoarthritis of the right knee that has failed conservative treatment to this point and continues to worsen. The pain has inhibited her ability to exercise and lose weight. Continues to experience generalized knee pain and wishes to continue with more aggressive treatment at this time.   Allergies  Allergen Reactions  . Codeine       Outpatient Medications Prior to Visit  Medication Sig Dispense Refill  . Alcohol Swabs (B-D SINGLE USE SWABS REGULAR) PADS USE AS DIRECTED TWICE DAILY 100 each 9  . Blood Glucose Monitoring Suppl (BAYER CONTOUR NEXT MONITOR) w/Device KIT Use to test blood sugar 1 kit 0  . clonazePAM (KLONOPIN) 1 MG tablet TAKE ONE TABLET BY MOUTH TWICE DAILY AS NEEDED FOR ANXIETY 180 tablet 0  . Diclofenac Sodium (PENNSAID) 2 % SOLN Place 1 application onto the skin 2 (two) times daily as needed. 112 g 1  . diphenhydrAMINE (BENADRYL) 25 MG tablet Take 25 mg by mouth every 6 (six) hours as needed.    . fluconazole (DIFLUCAN) 150 MG tablet TAKE ONE TABLET BY MOUTH AS A ONE-TIME DOSE 1 tablet 0  . glucose blood (BAYER CONTOUR NEXT TEST) test strip Use to test blood sugar three times daily- Dx code E11.65 100 each 5  . HUMULIN R U-500 KWIKPEN 500 UNIT/ML kwikpen INJECT 45 UNITS INTO THE SKIN IN THE MORNING AND 55 UNITS IN THE EVENING 6 mL 7  . hydrochlorothiazide (HYDRODIURIL) 12.5 MG tablet Take 1 tablet (12.5  mg total) by mouth daily. 30 tablet 5  . Ibuprofen-Famotidine 800-26.6 MG TABS Take 1 tablet by mouth 3 (three) times daily as needed. 90 tablet 1  . Insulin Pen Needle (NOVOFINE) 32G X 6 MM MISC Use to inject 2 times per day 300 each 2  . Insulin Syringe-Needle U-100 (B-D INSULIN SYRINGE) 31G X 5/16" 0.3 ML MISC Use 2 syringes daily with Levemir Insulin 100 each 3  . INVOKANA 100 MG TABS tablet TAKE ONE TABLET BY MOUTH ONCE DAILY BEFORE  BREAKFAST 30 tablet 1  . lovastatin (MEVACOR) 20 MG tablet TAKE ONE TABLET BY MOUTH AT BEDTIME 90 tablet 1  . metFORMIN (GLUCOPHAGE) 1000 MG tablet TAKE ONE TABLET BY MOUTH TWICE DAILY WITH FOOD 180 tablet 0  . omega-3 fish oil (MAXEPA) 1000 MG CAPS capsule Take by mouth.    [name] DELICA LANCETS 53M MISC USE TO TEST UP TO 3 TIMES DAILY 300 each 1  . sertraline (ZOLOFT) 100 MG tablet TAKE ONE & ONE-HALF TABLETS BY MOUTH ONCE DAILY 45 tablet 1  . TURMERIC PO Take by mouth.    . Sodium Hyaluronate (EUFLEXXA) 20 MG/2ML SOSY Inject 20 mg into the articular space once a week. 6 mL 0  . ASPIR-LOW 81 MG EC tablet TAKE 1 TABLET BY MOUTH ONCE DAILY (Patient not taking: Reported on 10/20/2016) 90 tablet 3  . montelukast (SINGULAIR) 10 MG tablet Take 1 tablet (10 mg total) by  mouth at bedtime. 30 tablet 3  . Multiple Vitamins-Minerals (MULTIVITAMIN WITH MINERALS) tablet Take 1 tablet by mouth daily.     No facility-administered medications prior to visit.       Review of Systems  Constitutional: Negative for chills and fever.  Musculoskeletal:       Positive for right knee pain.  Neurological: Negative for weakness and numbness.      Objective:    BP 124/84 (BP Location: Left Arm, Patient Position: Sitting, Cuff Size: Large)   Pulse 93   Temp 97.9 F (36.6 C) (Oral)   Resp 16   Ht '5\' 4"'$  (1.626 m)   Wt 252 lb (114.3 kg)   SpO2 97%   BMI 43.26 kg/m  Nursing note and vital signs reviewed.  Physical Exam  Constitutional: She is oriented to person,  place, and time. She appears well-developed and well-nourished. No distress.  Cardiovascular: Normal rate, regular rhythm, normal heart sounds and intact distal pulses.   Pulmonary/Chest: Effort normal and breath sounds normal.  Musculoskeletal:  No changes since previous exam on 12/1.   Neurological: She is alert and oriented to person, place, and time.  Skin: Skin is warm and dry.  Psychiatric: She has a normal mood and affect. Her behavior is normal. Judgment and thought content normal.    Procedure: Corticosteroid injection of the right knee  Description: Informed consent was obtained with discussion including risks and benefits of the procedure. Patient verbally wished to continued. Time out was performed. The site was marked and identified. It was cleansed with betadine using a concentric circular pattern. Skin anesthesia was applied using cold spray applied for 10 seconds. Injection of 1 ml :4 ml of Kenalog (40 mg.ml) to Sensoricaine was injected following aspiration with no return noted. Following the injection there was symptom improvement confirming appropriate placement. A bandage was applied to the area and post care instructions were provided. The procedure was tolerated well with no complications.       Assessment & Plan:   Problem List Items Addressed This Visit      Musculoskeletal and Integument   Osteoarthritis of right knee - Primary    Worsening osteoarthritis of the right knee with previously failed cortisone injection 1 year ago. Cortisone injection provided today in secondary attempt to improve pain. Most likely will last less than one week similar to previous. Consider Synvisc if symptoms do improve with this injection. Will most likely ultimately require knee replacement.           I have discontinued Ms. Simien's ASPIR-LOW, multivitamin with minerals, montelukast, and Sodium Hyaluronate. I am also having her maintain her B-D SINGLE USE SWABS REGULAR, TURMERIC  PO, omega-3 fish oil, diphenhydrAMINE, Diclofenac Sodium, Ibuprofen-Famotidine, metFORMIN, hydrochlorothiazide, fluconazole, clonazePAM, INVOKANA, Insulin Syringe-Needle A-355, ONETOUCH DELICA LANCETS 73U, Insulin Pen Needle, HUMULIN R U-500 KWIKPEN, sertraline, lovastatin, BAYER CONTOUR NEXT MONITOR, and glucose blood.   Follow-up: Return if symptoms worsen or fail to improve.  Mauricio Po, FNP

## 2016-10-30 NOTE — Assessment & Plan Note (Signed)
Worsening osteoarthritis of the right knee with previously failed cortisone injection 1 year ago. Cortisone injection provided today in secondary attempt to improve pain. Most likely will last less than one week similar to previous. Consider Synvisc if symptoms do improve with this injection. Will most likely ultimately require knee replacement.

## 2016-10-30 NOTE — Patient Instructions (Signed)
Thank you for choosing [facility].  SUMMARY AND INSTRUCTIONS:  Ice x 20 minutes every 2 hours as needed.   Schedule an appointment for Friday.  If your symptoms do not improve we will consider the Synvisc.   Follow up:  If your symptoms worsen or fail to improve, please contact our office for further instruction, or in case of emergency go directly to the emergency room at the closest medical facility.   Knee Injection, Care After Refer to this sheet in the next few weeks. These instructions provide you with information about caring for yourself after your procedure. Your health care provider may also give you more specific instructions. Your treatment has been planned according to current medical practices, but problems sometimes occur. Call your health care provider if you have any problems or questions after your procedure. What can I expect after the procedure? After the procedure, it is common to have:  Soreness.  Warmth.  Swelling. You may have more pain, swelling, and warmth than you did before the injection. This reaction may last for about one day. Follow these instructions at home: Bathing  If you were given a bandage (dressing), keep it dry until your health care provider says it can be removed. Ask your health care provider when you can start showering or taking a bath. Managing pain, stiffness, and swelling  If directed, apply ice to the injection area:  Put ice in a plastic bag.  Place a towel between your skin and the bag.  Leave the ice on for 20 minutes, 2-3 times per day.  Do not apply heat to your knee.  Raise the injection area above the level of your heart while you are sitting or lying down. Activity  Avoid strenuous activities for as long as directed by your health care provider. Ask your health care provider when you can return to your normal activities. General instructions  Take medicines only as directed by your health care  provider.  Do not take aspirin or other over-the-counter medicines unless your health care provider says you can.  Check your injection site every day for signs of infection. Watch for:  Redness, swelling, or pain.  Fluid, blood, or pus.  Follow your health care provider's instructions about dressing changes and removal. Contact a health care provider if:  You have symptoms at your injection site that last longer than two days after your procedure.  You have redness, swelling, or pain in your injection area.  You have fluid, blood, or pus coming from your injection site.  You have warmth in your injection area.  You have a fever.  Your pain is not controlled with medicine. Get help right away if:  Your knee turns very red.  Your knee becomes very swollen.  Your knee pain is severe. This information is not intended to replace advice given to you by your health care provider. Make sure you discuss any questions you have with your health care provider. Document Released: 11/27/2014 Document Revised: 07/12/2016 Document Reviewed: 09/16/2014 Elsevier Interactive Patient Education  2017 [employer].

## 2016-10-30 NOTE — Telephone Encounter (Signed)
Destiny Tucker from [facility] stated medication sent to the wrong  [facility]   please send prescription to. [id number] - [place], [place] [phone] (Phone) [phone] (Fax)

## 2016-10-31 ENCOUNTER — Other Ambulatory Visit: Payer: Self-pay | Admitting: Endocrinology

## 2016-10-31 MED ORDER — CANAGLIFLOZIN 100 MG PO TABS: ORAL_TABLET | ORAL | 1 refills | Status: DC

## 2016-10-31 MED ORDER — HYDROCHLOROTHIAZIDE 12.5 MG PO TABS: 12.5000 mg | ORAL_TABLET | Freq: Every day | ORAL | 5 refills | Status: DC

## 2016-10-31 NOTE — Telephone Encounter (Signed)
You sent medication to this pharmacy, and patient is really upset, please call her.

## 2016-10-31 NOTE — Telephone Encounter (Signed)
Problem has been resolved patient understands that the pharmacy made the mix-up all prescriptions sent in by me were sent to [facility] on [address]

## 2016-11-02 ENCOUNTER — Telehealth: Payer: Self-pay | Admitting: Family

## 2016-11-02 NOTE — Telephone Encounter (Signed)
Patient is calling about the injection she is having in her knee tomorrow. She wanted to take to the nurse and make sure there is no side effects she should have to worry about because she has something to do on Saturday and does not want that to affect it. Please follow up with patient. Thank you.

## 2016-11-03 ENCOUNTER — Encounter: Payer: Self-pay | Admitting: Family

## 2016-11-03 ENCOUNTER — Ambulatory Visit (INDEPENDENT_AMBULATORY_CARE_PROVIDER_SITE_OTHER): Payer: BLUE CROSS/BLUE SHIELD | Admitting: Family

## 2016-11-03 VITALS — BP 128/82 | HR 86 | Temp 97.7°F | Resp 16 | Ht 64.0 in

## 2016-11-03 DIAGNOSIS — M1711 Unilateral primary osteoarthritis, right knee: Secondary | ICD-10-CM

## 2016-11-03 NOTE — Assessment & Plan Note (Signed)
Osteoarthritis of right knee that has been refractory to multiple cortisone injections lasting less than 1 week. First injection of Synvisc provided without complication. Follow up in 1 week for next injection. Continue conservative treatment and icing regimen.

## 2016-11-03 NOTE — Patient Instructions (Signed)
Thank you for choosing [facility].  SUMMARY AND INSTRUCTIONS:  Ice x 20 minutes and every 2 hours as needed.  Take it easy for the next 24 hours.  Follow up in 1 week for next injection.    Follow up:  If your symptoms worsen or fail to improve, please contact our office for further instruction, or in case of emergency go directly to the emergency room at the closest medical facility.

## 2016-11-03 NOTE — Progress Notes (Signed)
Subjective:    Patient ID: Destiny Tucker, female    DOB: July 23, 1956, 60 y.o.   MRN: [id number]  Chief Complaint  Patient presents with  . Osteoarthritis    HPI:  SARIKA HARGETT is a 60 y.o. female who  has a past medical history of ALLERGIC RHINITIS; ANXIETY; DEPRESSION; Diabetes mellitus ([place]); HYPERLIPIDEMIA; HYPERTENSION; Internal hemorrhoids without mention of complication (123456); OSTEOPENIA; and TOBACCO USE, QUIT. and presents today for a follow up office visit.  Recently seen in the office with continued knee pain from right knee osteoarthritis and provided a cortisone injection. Reports initial improvement over the first 2-3 days and has not experienced gradual worsening of symptoms and would like to pursue the next step as this is the second cortisone injection that has lasted less than 1 week.    Allergies  Allergen Reactions  . Codeine       Outpatient Medications Prior to Visit  Medication Sig Dispense Refill  . Alcohol Swabs (B-D SINGLE USE SWABS REGULAR) PADS USE AS DIRECTED TWICE DAILY 100 each 9  . canagliflozin (INVOKANA) 100 MG TABS tablet TAKE ONE TABLET BY MOUTH ONCE DAILY BEFORE  BREAKFAST 90 tablet 1  . clonazePAM (KLONOPIN) 1 MG tablet TAKE ONE TABLET BY MOUTH TWICE DAILY AS NEEDED FOR ANXIETY 180 tablet 0  . Diclofenac Sodium (PENNSAID) 2 % SOLN Place 1 application onto the skin 2 (two) times daily as needed. 112 g 1  . diphenhydrAMINE (BENADRYL) 25 MG tablet Take 25 mg by mouth every 6 (six) hours as needed.    . fluconazole (DIFLUCAN) 150 MG tablet TAKE ONE TABLET BY MOUTH AS A ONE-TIME DOSE 1 tablet 0  . HUMULIN R U-500 KWIKPEN 500 UNIT/ML kwikpen INJECT 45 UNITS INTO THE SKIN IN THE MORNING AND 55 UNITS IN THE EVENING 6 mL 7  . hydrochlorothiazide (HYDRODIURIL) 12.5 MG tablet Take 1 tablet (12.5 mg total) by mouth daily. 90 tablet 5  . Ibuprofen-Famotidine 800-26.6 MG TABS Take 1 tablet by mouth 3 (three) times daily as needed. 90 tablet 1  .  Insulin Pen Needle (NOVOFINE) 32G X 6 MM MISC Use to inject 2 times per day 300 each 2  . Insulin Syringe-Needle U-100 (B-D INSULIN SYRINGE) 31G X 5/16" 0.3 ML MISC Use 2 syringes daily with Levemir Insulin 100 each 3  . lovastatin (MEVACOR) 20 MG tablet TAKE ONE TABLET BY MOUTH AT BEDTIME 90 tablet 1  . metFORMIN (GLUCOPHAGE) 1000 MG tablet TAKE ONE TABLET BY MOUTH TWICE DAILY WITH FOOD 180 tablet 2  . omega-3 fish oil (MAXEPA) 1000 MG CAPS capsule Take by mouth.    . sertraline (ZOLOFT) 100 MG tablet TAKE ONE & ONE-HALF TABLETS BY MOUTH ONCE DAILY 45 tablet 1  . TURMERIC PO Take by mouth.     No facility-administered medications prior to visit.      Review of Systems  Constitutional: Negative for chills and fever.  Musculoskeletal:       Positive for knee pain.  Neurological: Negative for weakness and numbness.      Objective:    BP 128/82 (BP Location: Left Arm, Patient Position: Sitting, Cuff Size: Large)   Pulse 86   Temp 97.7 F (36.5 C) (Oral)   Resp 16   Ht 5\' 4"  (1.626 m)   SpO2 98%  Nursing note and vital signs reviewed.  Physical Exam  Constitutional: She is oriented to person, place, and time. She appears well-developed and well-nourished. No distress.  Cardiovascular: Normal rate,  regular rhythm, normal heart sounds and intact distal pulses.   Pulmonary/Chest: Effort normal and breath sounds normal.  Musculoskeletal:  Right knee - mild edema with no deformity or discoloration. Tenderness around bilateral joint lines and anterior knee. Range of motion is within normal limits. Does ambulate with a mild antalgic gait. Ligamentous and meniscal testing are negative.   Neurological: She is alert and oriented to person, place, and time.  Skin: Skin is warm and dry.  Psychiatric: She has a normal mood and affect. Her behavior is normal. Judgment and thought content normal.   Procedure: Synvisc injection of right knee  Risks and benefits of the procedure including  alternatives were discussed in detail with verbal consent being obtain. The patient was seated on the exam table and the right anterior approach point was marked and cleansed using betadine. Time out was performed. Cold spray for superficial analgesia was applied and using the anteriolateral approach, the right knee was injected with 16 mg/2.5 ml of Synvisc (sodium hyaluronate) in a prefilled syringe. The medication using a 25 gauge syringe and was injected without complication. The procedure was tolerated well.      Assessment & Plan:   Problem List Items Addressed This Visit      Musculoskeletal and Integument   Osteoarthritis of right knee - Primary    Osteoarthritis of right knee that has been refractory to multiple cortisone injections lasting less than 1 week. First injection of Synvisc provided without complication. Follow up in 1 week for next injection. Continue conservative treatment and icing regimen.           I am having Ms. Lawry maintain her B-D SINGLE USE SWABS REGULAR, TURMERIC PO, omega-3 fish oil, diphenhydrAMINE, Diclofenac Sodium, Ibuprofen-Famotidine, fluconazole, clonazePAM, Insulin Syringe-Needle U-100, Insulin Pen Needle, HUMULIN R U-500 KWIKPEN, sertraline, lovastatin, metFORMIN, canagliflozin, and hydrochlorothiazide.   Follow-up: Return in about 1 week (around 11/10/2016).  Mauricio Po, FNP

## 2016-11-03 NOTE — Telephone Encounter (Signed)
Discussed with patient in the office prior to injection.

## 2016-11-07 ENCOUNTER — Ambulatory Visit: Payer: BLUE CROSS/BLUE SHIELD | Admitting: Psychology

## 2016-11-10 ENCOUNTER — Ambulatory Visit (INDEPENDENT_AMBULATORY_CARE_PROVIDER_SITE_OTHER): Payer: BLUE CROSS/BLUE SHIELD | Admitting: Family

## 2016-11-10 ENCOUNTER — Encounter: Payer: Self-pay | Admitting: Family

## 2016-11-10 ENCOUNTER — Telehealth: Payer: Self-pay | Admitting: Family

## 2016-11-10 DIAGNOSIS — M1711 Unilateral primary osteoarthritis, right knee: Secondary | ICD-10-CM | POA: Diagnosis not present

## 2016-11-10 NOTE — Telephone Encounter (Signed)
Patient is due for her last knee injection. Destiny Tucker is on [facility], and the patient has an appt across the street already on 11/16/2016 at 4. She is asking if you are able to work her in at around 330 on 11/16/16  Patient advised greg would speak to you about fitting her in

## 2016-11-10 NOTE — Patient Instructions (Signed)
Thank you for choosing [facility].  SUMMARY AND INSTRUCTIONS:  Please continue to ice and perform exercises.  Schedule an appointment for 1 week for last injection!  Happy Holidays!   Follow up:  If your symptoms worsen or fail to improve, please contact our office for further instruction, or in case of emergency go directly to the emergency room at the closest medical facility.

## 2016-11-10 NOTE — Progress Notes (Signed)
Subjective:    Patient ID: Destiny Tucker, female    DOB: [birth year], 60 y.o.   MRN: [id number]  Chief Complaint  Patient presents with  . Injections    HPI:  Destiny Tucker is a 60 y.o. female who  has a past medical history of ALLERGIC RHINITIS; ANXIETY; DEPRESSION; Diabetes mellitus ([place]); HYPERLIPIDEMIA; HYPERTENSION; Internal hemorrhoids without mention of complication (123456); OSTEOPENIA; and TOBACCO USE, QUIT. and presents today for follow up.   Recently unsuccessful attempt of treatment with cortisone injection for right knee osteoarthritis and completed 1 injection of Synvisc thus far. Reports no adverse side effects since previous injection and some improvement in symptoms.. She is here for the second injection.    Allergies  Allergen Reactions  . Codeine    Current Outpatient Prescriptions on File Prior to Visit  Medication Sig Dispense Refill  . Alcohol Swabs (B-D SINGLE USE SWABS REGULAR) PADS USE AS DIRECTED TWICE DAILY 100 each 9  . canagliflozin (INVOKANA) 100 MG TABS tablet TAKE ONE TABLET BY MOUTH ONCE DAILY BEFORE  BREAKFAST 90 tablet 1  . clonazePAM (KLONOPIN) 1 MG tablet TAKE ONE TABLET BY MOUTH TWICE DAILY AS NEEDED FOR ANXIETY 180 tablet 0  . Diclofenac Sodium (PENNSAID) 2 % SOLN Place 1 application onto the skin 2 (two) times daily as needed. 112 g 1  . diphenhydrAMINE (BENADRYL) 25 MG tablet Take 25 mg by mouth every 6 (six) hours as needed.    . fluconazole (DIFLUCAN) 150 MG tablet TAKE ONE TABLET BY MOUTH AS A ONE-TIME DOSE 1 tablet 0  . HUMULIN R U-500 KWIKPEN 500 UNIT/ML kwikpen INJECT 45 UNITS INTO THE SKIN IN THE MORNING AND 55 UNITS IN THE EVENING 6 mL 7  . hydrochlorothiazide (HYDRODIURIL) 12.5 MG tablet Take 1 tablet (12.5 mg total) by mouth daily. 90 tablet 5  . Ibuprofen-Famotidine 800-26.6 MG TABS Take 1 tablet by mouth 3 (three) times daily as needed. 90 tablet 1  . Insulin Pen Needle (NOVOFINE) 32G X 6 MM MISC Use to inject 2 times per  day 300 each 2  . Insulin Syringe-Needle U-100 (B-D INSULIN SYRINGE) 31G X 5/16" 0.3 ML MISC Use 2 syringes daily with Levemir Insulin 100 each 3  . lovastatin (MEVACOR) 20 MG tablet TAKE ONE TABLET BY MOUTH AT BEDTIME 90 tablet 1  . metFORMIN (GLUCOPHAGE) 1000 MG tablet TAKE ONE TABLET BY MOUTH TWICE DAILY WITH FOOD 180 tablet 2  . omega-3 fish oil (MAXEPA) 1000 MG CAPS capsule Take by mouth.    . sertraline (ZOLOFT) 100 MG tablet TAKE ONE & ONE-HALF TABLETS BY MOUTH ONCE DAILY 45 tablet 1  . TURMERIC PO Take by mouth.     No current facility-administered medications on file prior to visit.      Review of Systems  Constitutional: Negative for chills and fever.  Musculoskeletal:       Positive for right knee pain  Neurological: Negative for weakness.      Objective:    BP 132/80 (BP Location: Left Arm, Patient Position: Sitting, Cuff Size: Large)   Pulse 87   Temp 98 F (36.7 C) (Oral)   Resp 18   Ht 5\' 4"  (1.626 m)   Wt 248 lb (112.5 kg)   SpO2 98%   BMI 42.57 kg/m  Nursing note and vital signs reviewed.  Physical Exam  Procedure: Synvisc injection of right knee.  Description: Informed consent was obtained with discussion including risks and benefits of the procedure. Patient verbally wished  to continued. Time out was performed. The site was marked and identified. It was cleansed with betadine using a concentric circular pattern. Skin anesthesia was applied using cold spray applied for 10 seconds. 3 ml of sensoricaine was administered follow by Synvisc injection with aspiration negative prior to administration.There was improvement of symptoms following injection.. A bandage was applied to the area and post care instructions were provided. The procedure was tolerated well with no complications.      Assessment & Plan:   Problem List Items Addressed This Visit      Musculoskeletal and Integument   Osteoarthritis of right knee    Osteoarthritis with some improvement with  first Synvisc injection. Second injection completed today and tolerated well. Follow up in 1 week for 3rd injection.          I am having Ms. Saline maintain her B-D SINGLE USE SWABS REGULAR, TURMERIC PO, omega-3 fish oil, diphenhydrAMINE, Diclofenac Sodium, Ibuprofen-Famotidine, fluconazole, clonazePAM, Insulin Syringe-Needle U-100, Insulin Pen Needle, HUMULIN R U-500 KWIKPEN, sertraline, lovastatin, metFORMIN, canagliflozin, and hydrochlorothiazide.   Follow-up: Return in about 1 week (around 11/17/2016).  Mauricio Po, FNP

## 2016-11-10 NOTE — Assessment & Plan Note (Signed)
Osteoarthritis with some improvement with first Synvisc injection. Second injection completed today and tolerated well. Follow up in 1 week for 3rd injection.

## 2016-11-12 IMAGING — DX DG HIP (WITH OR WITHOUT PELVIS) 2-3V*L*
2 series · 2 of 2 positions shown · non-contrast
Comparison: None.

CLINICAL DATA: Left hip pain

EXAM:
DG HIP (WITH OR WITHOUT PELVIS) 2-3V LEFT

[hip ap]
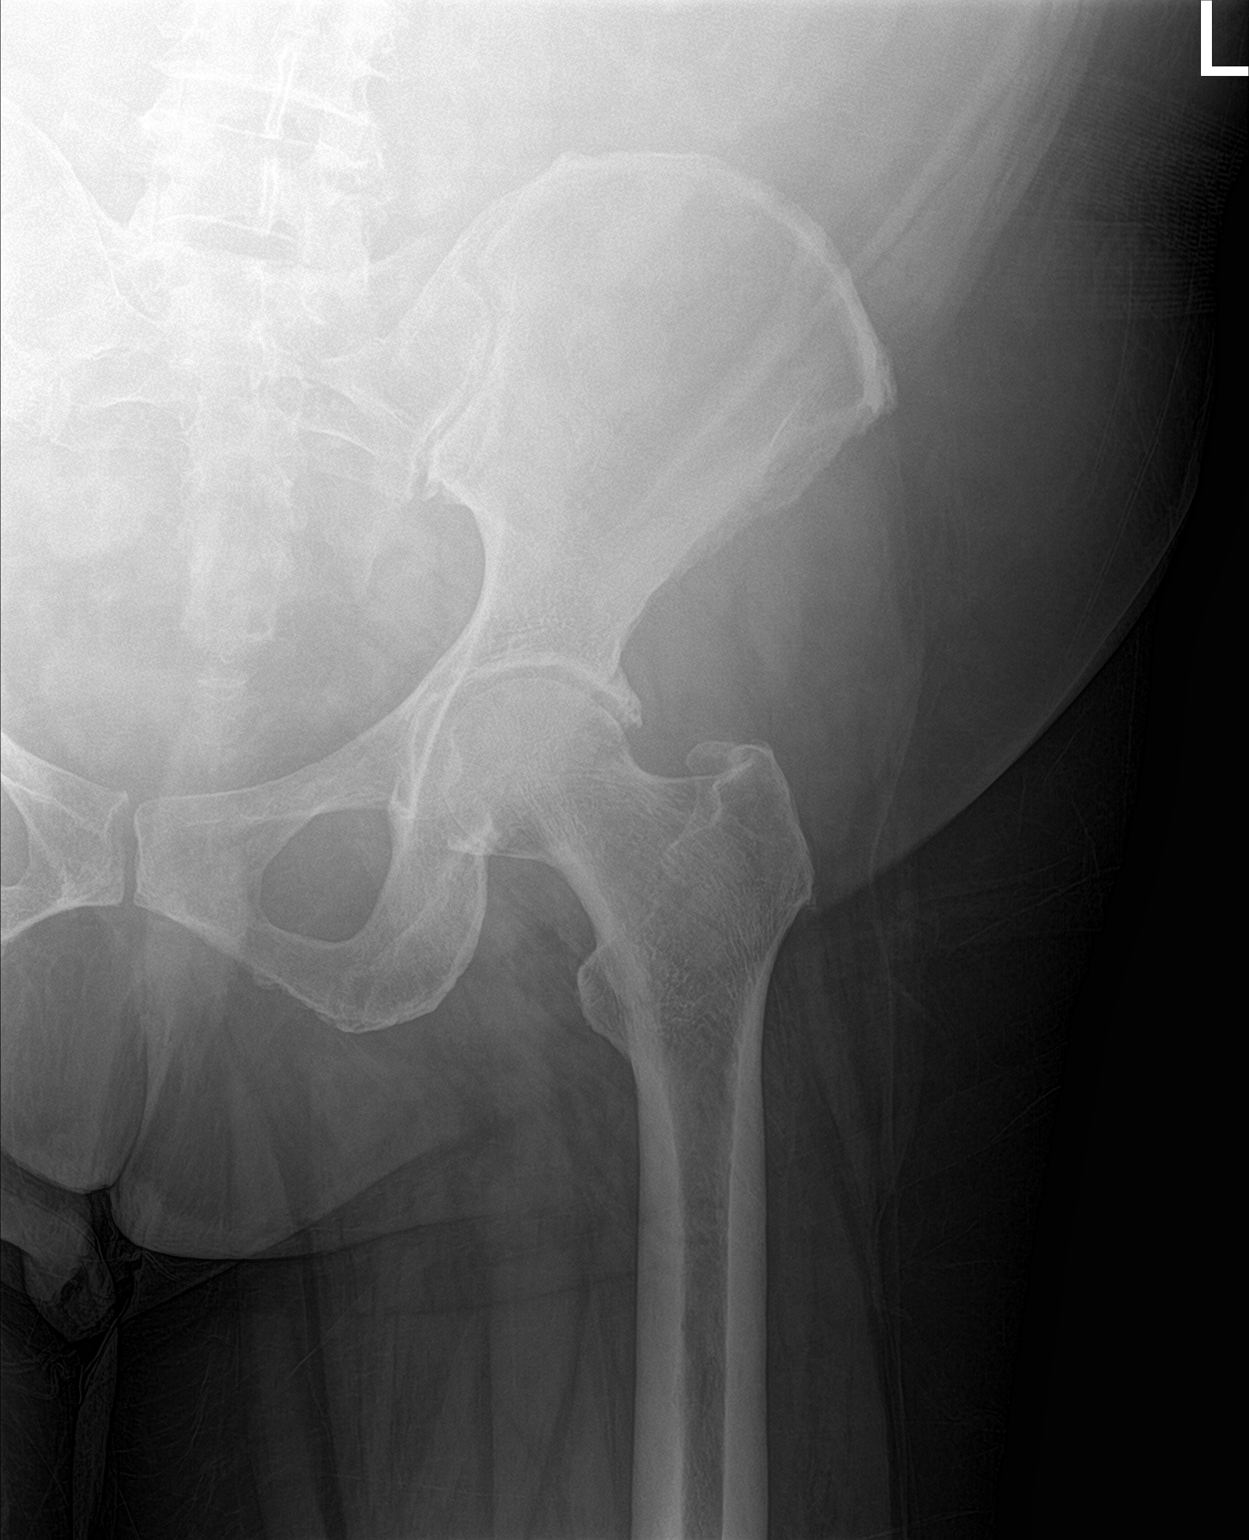

[hip lat]
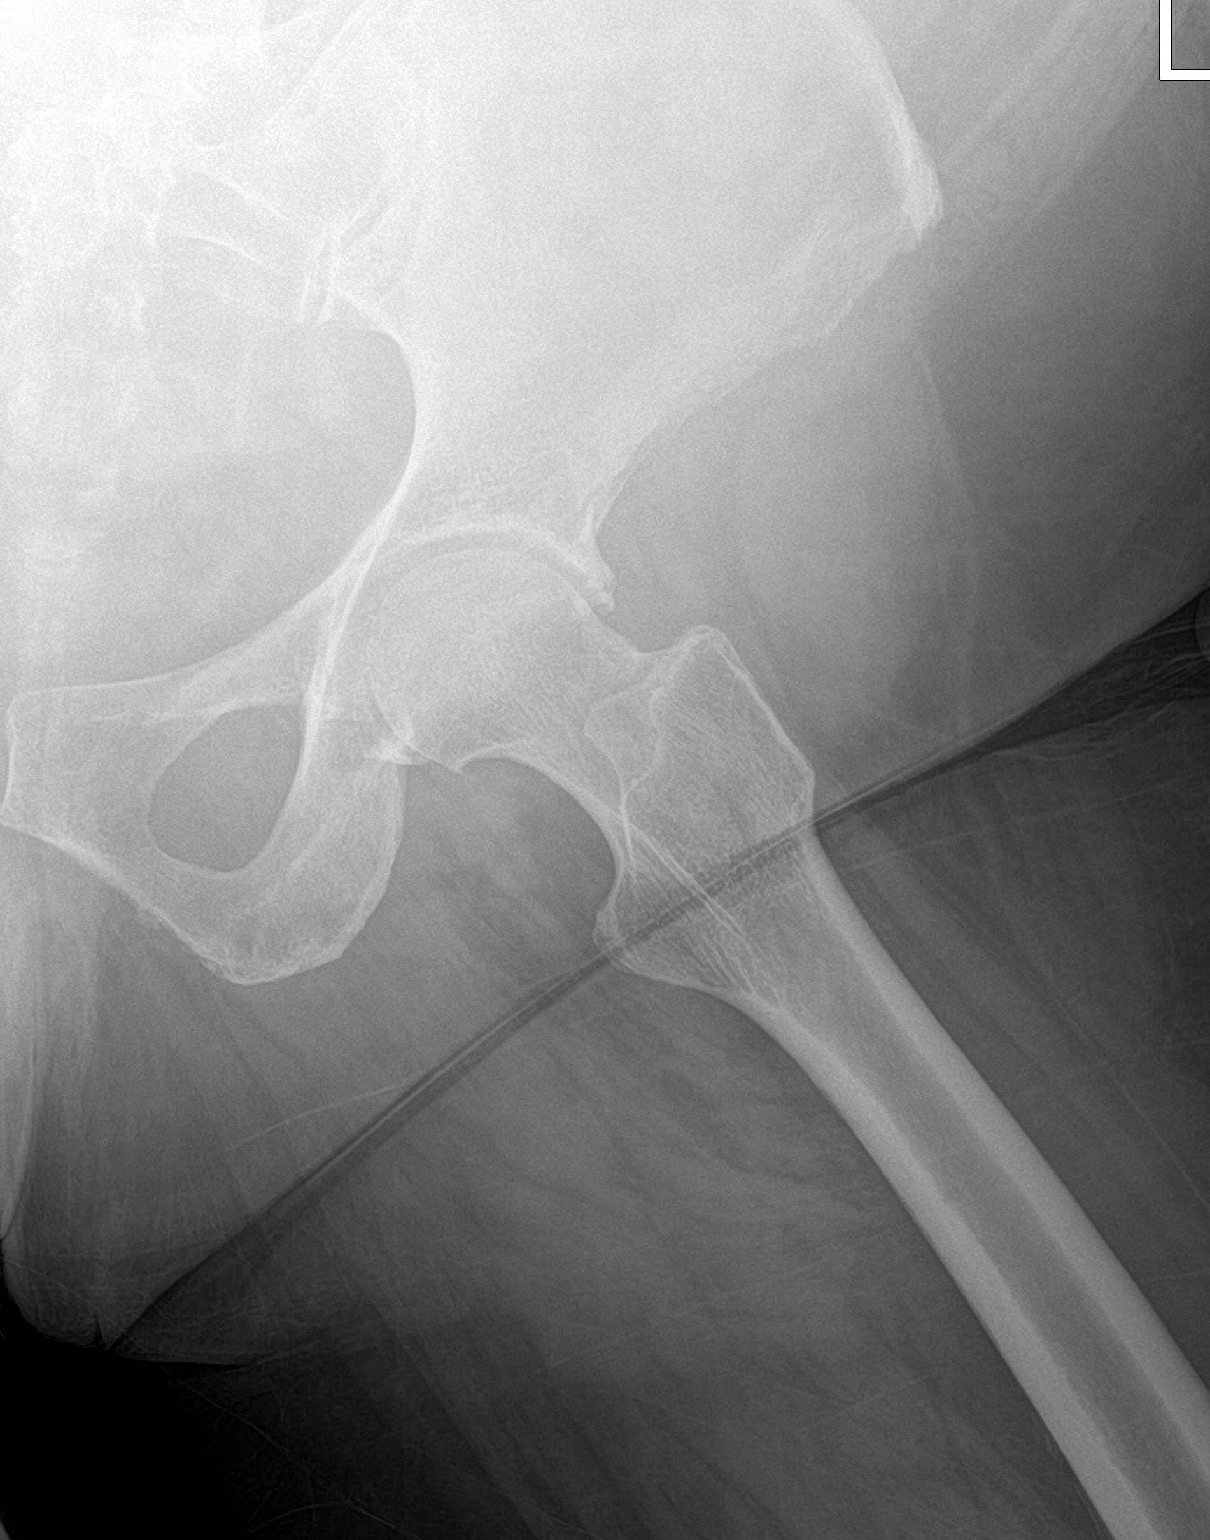

[2 of 2 positions shown; findings below may reference images not displayed]

FINDINGS: No acute fracture. No dislocation. Unremarkable soft tissues.
Moderate degenerative change of the hip joint.
IMPRESSION: No acute bony pathology.

## 2016-11-14 NOTE — Telephone Encounter (Signed)
I guess so Would be better if greg but understand with the end of the year.  OK to double book if needed.

## 2016-11-14 NOTE — Telephone Encounter (Signed)
lmovm for pt to return call.  

## 2016-11-14 NOTE — Telephone Encounter (Signed)
Pt scheduled 12.28.17 @245pm .

## 2016-11-16 ENCOUNTER — Ambulatory Visit (INDEPENDENT_AMBULATORY_CARE_PROVIDER_SITE_OTHER): Payer: BLUE CROSS/BLUE SHIELD | Admitting: Family Medicine

## 2016-11-16 ENCOUNTER — Encounter: Payer: Self-pay | Admitting: Family Medicine

## 2016-11-16 DIAGNOSIS — M1711 Unilateral primary osteoarthritis, right knee: Secondary | ICD-10-CM

## 2016-11-16 NOTE — Patient Instructions (Signed)
Good to see you  You are done! Ice is your friend.  Can take up to 1 month to get full affect  Likely follow up with [name] again in 4-6 weeks if not doing much better Happy New Year!

## 2016-11-16 NOTE — Progress Notes (Signed)
[facility] [address], [place] [address] Phone: [phone] Subjective:     CC: Right knee pain  [id number]  Destiny Tucker is a 60 y.o. female coming in with complaint of right knee pain. Patient has not are still arthritic changes of the knee. Patient has failed all conservative therapy. He is here for third injection in a series of 3 injections for viscous supplementation of this knee. Has noticed very mild improvement with the first 2.     Past Medical History:  Diagnosis Date  . ALLERGIC RHINITIS   . ANXIETY   . DEPRESSION   . Diabetes mellitus ([place])   . HYPERLIPIDEMIA   . HYPERTENSION   . Internal hemorrhoids without mention of complication [id number]   Colonoscopy--Dr. Fuller Plan , pts. states hemorrhoids are uncomfortable  . OSTEOPENIA   . TOBACCO USE, QUIT    Past Surgical History:  Procedure Laterality Date  . ANUS SURGERY    . MOHS SURGERY     Precancerous skin lesion  . Removal of cyst & other ovary  09/2004  . Removal of fallopion tube & ovary  1986  . Unilateral Salpingo-Oophorectomy     Social History   Social History  . Marital status: Married    Spouse name: N/A  . Number of children: N/A  . Years of education: N/A   Occupational History  . [employer]   Social History Main Topics  . Smoking status: Former Smoker    Quit date: 02/18/2010  . Smokeless tobacco: None     Comment: Married, lives with spouse. employed by [employer]  . Alcohol use Yes     Comment: Occassional  . Drug use: No  . Sexual activity: Not Asked   Other Topics Concern  . None   Social History Narrative  . None   Allergies  Allergen Reactions  . Codeine    Family History  Problem Relation Age of Onset  . Hypertension Mother   . Hyperlipidemia Mother   . Nephrolithiasis Mother   . Hypertension Father   . Hyperlipidemia Father   . Nephrolithiasis Father   .  Diabetes Maternal Uncle   . Diabetes Mother     Past medical history, social, surgical and family history all reviewed in electronic medical record.  No pertanent information unless stated regarding to the chief complaint.   Review of Systems:Review of systems updated and as accurate as of 11/16/16  No headache, visual changes, nausea, vomiting, diarrhea, constipation, dizziness, abdominal pain, skin rash, fevers, chills, night sweats, weight loss, swollen lymph nodes,  chest pain, shortness of breath, mood changes.   Objective  Blood pressure 138/84, pulse 83, height 5\' 4"  (1.626 m), weight 248 lb (112.5 kg), SpO2 97 %. Systems examined below as of 11/16/16   General: No apparent distress alert and oriented x3 mood and affect normal, dressed appropriately.  HEENT: Pupils equal, extraocular movements intact  Respiratory: Patient's speak in full sentences and does not appear short of breath  Cardiovascular: No lower extremity edema, non tender, no erythema  Skin: Warm dry intact with no signs of infection or rash on extremities or on axial skeleton.  Abdomen: Soft nontender  Neuro: Cranial nerves II through XII are intact, neurovascularly intact in all extremities with 2+ DTRs and 2+ pulses.  Lymph: No lymphadenopathy of posterior or anterior cervical chain or axillae bilaterally.  Gait normal with good balance and coordination.  MSK:  Non tender  with full range of motion and good stability and symmetric strength and tone of shoulders, elbows, wrist, hip and ankles bilaterally.  Knee: Normal to inspection with no erythema or effusion or obvious bony abnormalities. Palpation normal with no warmth, joint line tenderness, patellar tenderness, or condyle tenderness. ROM full in flexion and extension and lower leg rotation. Ligaments with solid consistent endpoints including ACL, PCL, LCL, MCL. Negative Mcmurray's, Apley's, and Thessalonian tests. Non painful patellar compression. Patellar  glide without crepitus. Patellar and quadriceps tendons unremarkable. Hamstring and quadriceps strength is normal.    After informed written and verbal consent, patient was seated on exam table. Left knee was prepped with alcohol swab and utilizing anterolateral approach, patient's left knee space was injected with 16 mg/2.5 mL of Synvisc (sodium hyaluronate) in a prefilled syringe was injected easily into the knee through a 22-gauge needle.. Patient tolerated the procedure well without immediate complications.    Impression and Recommendations:     This case required medical decision making of moderate complexity.      Note: This dictation was prepared with Dragon dictation along with smaller phrase technology. Any transcriptional errors that result from this process are unintentional.

## 2016-11-16 NOTE — Assessment & Plan Note (Signed)
Current vital injection given. Encourage her to continue conservative therapy. We discussed icing regimen and home exercises. We discussed which activities would be beneficial. Topical anti-inflammatories also good. Follow-up with primary care provider in the next 4-6 weeks.

## 2016-12-06 ENCOUNTER — Other Ambulatory Visit: Payer: Self-pay | Admitting: Family

## 2016-12-08 NOTE — Telephone Encounter (Signed)
Faxed

## 2016-12-08 NOTE — Telephone Encounter (Signed)
Last refill was 09/06/16

## 2016-12-11 ENCOUNTER — Encounter: Payer: Self-pay | Admitting: Student

## 2016-12-14 ENCOUNTER — Telehealth: Payer: Self-pay | Admitting: Endocrinology

## 2016-12-14 NOTE — Telephone Encounter (Signed)
Patient ask you to give her a call, she said it was quite a detailed message.

## 2016-12-15 ENCOUNTER — Telehealth: Payer: Self-pay | Admitting: Family

## 2016-12-15 NOTE — Telephone Encounter (Signed)
Patient was wanted to see if we had any samples of: HUMULIN R U-500 KWIKPEN 500 UNIT/ML kwikpen In the office that she can have or is there any discount cards or is there any other pens that are cheaper that her insurance will cover.

## 2016-12-15 NOTE — Telephone Encounter (Signed)
Pt called asking what [name] thinks about Contrive? And wondering he can prescribe it for to help her loss weight.  Pt also wants to know if we can find out which is the alternative for med that she inject in her knee( pt tried to call but she can not understand them) please help

## 2016-12-18 NOTE — Telephone Encounter (Signed)
Please have patient make office visit to discuss weight loss medications. As for the medication we injected Synvisc into her knee. The other that we changed from was Euflexxa.

## 2016-12-18 NOTE — Telephone Encounter (Signed)
Left vm for pt to call back, need to offer an appt to see [name] to discuss all this.

## 2016-12-19 NOTE — Telephone Encounter (Signed)
Pt has appt with [name] 12/22/16.

## 2016-12-20 ENCOUNTER — Telehealth: Payer: Self-pay

## 2016-12-20 NOTE — Telephone Encounter (Signed)
We do not keep samples.  She can find out if Toujeo or Tyler Aas is covered along with Humalog or NovoLog

## 2016-12-20 NOTE — Telephone Encounter (Signed)
Called patient and left message advising patient to contact her insurance company to find out if the medications Dr.Kumar is asking of will be covered and will be cheaper. Gave call back number if any questions.

## 2016-12-22 ENCOUNTER — Ambulatory Visit (INDEPENDENT_AMBULATORY_CARE_PROVIDER_SITE_OTHER): Payer: BLUE CROSS/BLUE SHIELD | Admitting: Family

## 2016-12-22 DIAGNOSIS — E669 Obesity, unspecified: Secondary | ICD-10-CM | POA: Insufficient documentation

## 2016-12-22 DIAGNOSIS — E6609 Other obesity due to excess calories: Secondary | ICD-10-CM | POA: Diagnosis not present

## 2016-12-22 DIAGNOSIS — M1712 Unilateral primary osteoarthritis, left knee: Secondary | ICD-10-CM | POA: Diagnosis not present

## 2016-12-22 DIAGNOSIS — Z6841 Body Mass Index (BMI) 40.0 and over, adult: Secondary | ICD-10-CM | POA: Diagnosis not present

## 2016-12-22 DIAGNOSIS — IMO0001 Reserved for inherently not codable concepts without codable children: Secondary | ICD-10-CM

## 2016-12-22 NOTE — Patient Instructions (Addendum)
Thank you for choosing [facility].  SUMMARY AND INSTRUCTIONS:  For your knee: Othrovisc, Synvisc, Euflexxa  For weight loss: Contrave, Belviq, Saxenda   Ice 20 minutes every 2 hours and as needed after activity.  Take it easy on your left knee for the next 24 hours.   Medication:  Your prescription(s) have been submitted to your pharmacy or been printed and provided for you. Please take as directed and contact our office if you believe you are having problem(s) with the medication(s) or have any questions.  Follow up:  If your symptoms worsen or fail to improve, please contact our office for further instruction, or in case of emergency go directly to the emergency room at the closest medical facility.    Calorie Counting for Weight Loss Calories are energy you get from the things you eat and drink. Your body uses this energy to keep you going throughout the day. The number of calories you eat affects your weight. When you eat more calories than your body needs, your body stores the extra calories as fat. When you eat fewer calories than your body needs, your body burns fat to get the energy it needs. Calorie counting means keeping track of how many calories you eat and drink each day. If you make sure to eat fewer calories than your body needs, you should lose weight. In order for calorie counting to work, you will need to eat the number of calories that are right for you in a day to lose a healthy amount of weight per week. A healthy amount of weight to lose per week is usually 1-2 lb (0.5-0.9 kg). A dietitian can determine how many calories you need in a day and give you suggestions on how to reach your calorie goal.  WHAT IS MY MY PLAN? My goal is to have __________ calories per day.  If I have this many calories per day, I should lose around __________ pounds per week. WHAT DO I NEED TO KNOW ABOUT CALORIE COUNTING? In order to meet your daily calorie goal, you will need  to:  Find out how many calories are in each food you would like to eat. Try to do this before you eat.  Decide how much of the food you can eat.  Write down what you ate and how many calories it had. Doing this is called keeping a food log. WHERE DO I FIND CALORIE INFORMATION? The number of calories in a food can be found on a Nutrition Facts label. Note that all the information on a label is based on a specific serving of the food. If a food does not have a Nutrition Facts label, try to look up the calories online or ask your dietitian for help. HOW DO I DECIDE HOW MUCH TO EAT? To decide how much of the food you can eat, you will need to consider both the number of calories in one serving and the size of one serving. This information can be found on the Nutrition Facts label. If a food does not have a Nutrition Facts label, look up the information online or ask your dietitian for help. Remember that calories are listed per serving. If you choose to have more than one serving of a food, you will have to multiply the calories per serving by the amount of servings you plan to eat. For example, the label on a package of bread might say that a serving size is 1 slice and that there are 90  calories in a serving. If you eat 1 slice, you will have eaten 90 calories. If you eat 2 slices, you will have eaten 180 calories. HOW DO I KEEP A FOOD LOG? After each meal, record the following information in your food log:  What you ate.  How much of it you ate.  How many calories it had.  Then, add up your calories. Keep your food log near you, such as in a small notebook in your pocket. Another option is to use a mobile app or website. Some programs will calculate calories for you and show you how many calories you have left each time you add an item to the log. WHAT ARE SOME CALORIE COUNTING TIPS?  Use your calories on foods and drinks that will fill you up and not leave you hungry. Some examples of this  include foods like nuts and nut butters, vegetables, lean proteins, and high-fiber foods (more than 5 g fiber per serving).  Eat nutritious foods and avoid empty calories. Empty calories are calories you get from foods or beverages that do not have many nutrients, such as candy and soda. It is better to have a nutritious high-calorie food (such as an avocado) than a food with few nutrients (such as a bag of chips).  Know how many calories are in the foods you eat most often. This way, you do not have to look up how many calories they have each time you eat them.  Look out for foods that may seem like low-calorie foods but are really high-calorie foods, such as baked goods, soda, and fat-free candy.  Pay attention to calories in drinks. Drinks such as sodas, specialty coffee drinks, alcohol, and juices have a lot of calories yet do not fill you up. Choose low-calorie drinks like water and diet drinks.  Focus your calorie counting efforts on higher calorie items. Logging the calories in a garden salad that contains only vegetables is less important than calculating the calories in a milk shake.  Find a way of tracking calories that works for you. Get creative. Most people who are successful find ways to keep track of how much they eat in a day, even if they do not count every calorie. WHAT ARE SOME PORTION CONTROL TIPS?  Know how many calories are in a serving. This will help you know how many servings of a certain food you can have.  Use a measuring cup to measure serving sizes. This is helpful when you start out. With time, you will be able to estimate serving sizes for some foods.  Take some time to put servings of different foods on your favorite plates, bowls, and cups so you know what a serving looks like.  Try not to eat straight from a bag or box. Doing this can lead to overeating. Put the amount you would like to eat in a cup or on a plate to make sure you are eating the right  portion.  Use smaller plates, glasses, and bowls to prevent overeating. This is a quick and easy way to practice portion control. If your plate is smaller, less food can fit on it.  Try not to multitask while eating, such as watching TV or using your computer. If it is time to eat, sit down at a table and enjoy your food. Doing this will help you to start recognizing when you are full. It will also make you more aware of what and how much you are eating. HOW CAN  I CALORIE COUNT WHEN EATING OUT?  Ask for smaller portion sizes or child-sized portions.  Consider sharing an entree and sides instead of getting your own entree.  If you get your own entree, eat only half. Ask for a box at the beginning of your meal and put the rest of your entree in it so you are not tempted to eat it.  Look for the calories on the menu. If calories are listed, choose the lower calorie options.  Choose dishes that include vegetables, fruits, whole grains, low-fat dairy products, and lean protein. Focusing on smart food choices from each of the 5 food groups can help you stay on track at restaurants.  Choose items that are boiled, broiled, grilled, or steamed.  Choose water, milk, unsweetened iced tea, or other drinks without added sugars. If you want an alcoholic beverage, choose a lower calorie option. For example, a regular margarita can have up to 700 calories and a glass of wine has around 150.  Stay away from items that are buttered, battered, fried, or served with cream sauce. Items labeled "crispy" are usually fried, unless stated otherwise.  Ask for dressings, sauces, and syrups on the side. These are usually very high in calories, so do not eat much of them.  Watch out for salads. Many people think salads are a healthy option, but this is often not the case. Many salads come with bacon, fried chicken, lots of cheese, fried chips, and dressing. All of these items have a lot of calories. If you want a salad,  choose a garden salad and ask for grilled meats or steak. Ask for the dressing on the side, or ask for olive oil and vinegar or lemon to use as dressing.  Estimate how many servings of a food you are given. For example, a serving of cooked rice is  cup or about the size of half a tennis ball or one cupcake wrapper. Knowing serving sizes will help you be aware of how much food you are eating at restaurants. The list below tells you how big or small some common portion sizes are based on everyday objects.  1 oz-4 stacked dice.  3 oz-1 deck of cards.  1 tsp-1 dice.  1 Tbsp- a Ping-Pong ball.  2 Tbsp-1 Ping-Pong ball.   cup-1 tennis ball or 1 cupcake wrapper.  1 cup-1 baseball. This information is not intended to replace advice given to you by your health care provider. Make sure you discuss any questions you have with your health care provider. Document Released: 11/06/2005 Document Revised: 11/27/2014 Document Reviewed: 09/11/2013 Elsevier Interactive Patient Education  2017 [employer].   Exercising to [employer] Introduction Exercising can help you to lose weight. In order to lose weight through exercise, you need to do vigorous-intensity exercise. You can tell that you are exercising with vigorous intensity if you are breathing very hard and fast and cannot hold a conversation while exercising. Moderate-intensity exercise helps to maintain your current weight. You can tell that you are exercising at a moderate level if you have a higher heart rate and faster breathing, but you are still able to hold a conversation. How often should I exercise? Choose an activity that you enjoy and set realistic goals. Your health care provider can help you to make an activity plan that works for you. Exercise regularly as directed by your health care provider. This may include:  Doing resistance training twice each week, such as:  Push-ups.  Sit-ups.  Lifting  weights.  Using resistance  bands.  Doing a given intensity of exercise for a given amount of time. Choose from these options:  150 minutes of moderate-intensity exercise every week.  75 minutes of vigorous-intensity exercise every week.  A mix of moderate-intensity and vigorous-intensity exercise every week. Children, pregnant women, people who are out of shape, people who are overweight, and older adults may need to consult a health care provider for individual recommendations. If you have any sort of medical condition, be sure to consult your health care provider before starting a new exercise program. What are some activities that can help me to lose weight?  Walking at a rate of at least 4.5 miles an hour.  Jogging or running at a rate of 5 miles per hour.  Biking at a rate of at least 10 miles per hour.  Lap swimming.  Roller-skating or in-line skating.  Cross-country skiing.  Vigorous competitive sports, such as football, basketball, and soccer.  Jumping rope.  Aerobic dancing. How can I be more active in my day-to-day activities?  Use the stairs instead of the elevator.  Take a walk during your lunch break.  If you drive, park your car farther away from work or school.  If you take public transportation, get off one stop early and walk the rest of the way.  Make all of your phone calls while standing up and walking around.  Get up, stretch, and walk around every 30 minutes throughout the day. What guidelines should I follow while exercising?  Do not exercise so much that you hurt yourself, feel dizzy, or get very short of breath.  Consult your health care provider prior to starting a new exercise program.  Wear comfortable clothes and shoes with good support.  Drink plenty of water while you exercise to prevent dehydration or heat stroke. Body water is lost during exercise and must be replaced.  Work out until you breathe faster and your heart beats faster. This information is not  intended to replace advice given to you by your health care provider. Make sure you discuss any questions you have with your health care provider. Document Released: 12/09/2010 Document Revised: 04/13/2016 Document Reviewed: 04/09/2014  2017 Elsevier

## 2016-12-22 NOTE — Assessment & Plan Note (Signed)
BMI of 42 with recommended weight loss of 5-10% of current body weight through nutrition and physical activity. Discussed importance of nutrition choices that her varied, moderate, and balanced. Gradually increase physical activity as tolerated. Recommended aquatic therapy to start to help with weight loss and for ease of pressure on her knees. May also consider cycling. Medication as an option with choices provided including Contrave, Belviq and Saxenda. Continue to monitor.

## 2016-12-22 NOTE — Assessment & Plan Note (Signed)
Symptoms and exam remain consistent with osteoarthritis of the left knee that has been refractory to conservative treatments of ice, compression and home exercise therapy. Corticosteroid injection provided without complication. Continue with icing regimen. Consider hyaluronic acid injection if symptoms do not improve with injection. Recommend weight loss as this is a significant contributing factor. Continue to monitor.

## 2016-12-22 NOTE — Progress Notes (Signed)
Subjective:    Patient ID: Destiny Tucker, female    DOB: July 31, 1956, 61 y.o.   MRN: [id number]  Chief Complaint  Patient presents with  . med consult    wants to talk about contrave for weight loss    HPI:  Destiny Tucker is a 61 y.o. female who  has a past medical history of ALLERGIC RHINITIS; ANXIETY; DEPRESSION; Diabetes mellitus ([place]); HYPERLIPIDEMIA; HYPERTENSION; Internal hemorrhoids without mention of complication (123456); OSTEOPENIA; and TOBACCO USE, QUIT. and presents today for an office visit.  1.) Weight management -  Continues to experience the associated symptoms of difficulty with losing weight. She has been working on weight loss through nutrition and physical activity. Nutritionally she is cutting back on calories and has lost about 3 pounds since she started. She is considering starting Weight Watchers and has questions regarding weight loss medication.   Wt Readings from Last 3 Encounters:  12/22/16 245 lb (111.1 kg)  11/16/16 248 lb (112.5 kg)  11/10/16 248 lb (112.5 kg)   2.) Primary osteoarthritis of the left knee - Continues to experience the associated symptom of pain described as achy and stinging has been going on for a couple of weeks and is located in her left knee. Previous x-rays have revealed tricompartment degenerative changes. Modifying factors include a compression sleeve, ice, and elevation. She has also been attempted to lose wieght.    Allergies  Allergen Reactions  . Codeine       Outpatient Medications Prior to Visit  Medication Sig Dispense Refill  . Alcohol Swabs (B-D SINGLE USE SWABS REGULAR) PADS USE AS DIRECTED TWICE DAILY 100 each 9  . canagliflozin (INVOKANA) 100 MG TABS tablet TAKE ONE TABLET BY MOUTH ONCE DAILY BEFORE  BREAKFAST 90 tablet 1  . clonazePAM (KLONOPIN) 1 MG tablet TAKE ONE TABLET BY MOUTH TWICE DAILY AS NEEDED FOR  ANXIETY 180 tablet 0  . Diclofenac Sodium (PENNSAID) 2 % SOLN Place 1 application onto the skin 2  (two) times daily as needed. 112 g 1  . diphenhydrAMINE (BENADRYL) 25 MG tablet Take 25 mg by mouth every 6 (six) hours as needed.    . fluconazole (DIFLUCAN) 150 MG tablet TAKE ONE TABLET BY MOUTH AS A ONE-TIME DOSE 1 tablet 0  . HUMULIN R U-500 KWIKPEN 500 UNIT/ML kwikpen INJECT 45 UNITS INTO THE SKIN IN THE MORNING AND 55 UNITS IN THE EVENING 6 mL 7  . hydrochlorothiazide (HYDRODIURIL) 12.5 MG tablet Take 1 tablet (12.5 mg total) by mouth daily. 90 tablet 5  . Ibuprofen-Famotidine 800-26.6 MG TABS Take 1 tablet by mouth 3 (three) times daily as needed. 90 tablet 1  . Insulin Pen Needle (NOVOFINE) 32G X 6 MM MISC Use to inject 2 times per day 300 each 2  . Insulin Syringe-Needle U-100 (B-D INSULIN SYRINGE) 31G X 5/16" 0.3 ML MISC Use 2 syringes daily with Levemir Insulin 100 each 3  . lovastatin (MEVACOR) 20 MG tablet TAKE ONE TABLET BY MOUTH AT BEDTIME 90 tablet 1  . metFORMIN (GLUCOPHAGE) 1000 MG tablet TAKE ONE TABLET BY MOUTH TWICE DAILY WITH FOOD 180 tablet 2  . omega-3 fish oil (MAXEPA) 1000 MG CAPS capsule Take by mouth.    . sertraline (ZOLOFT) 100 MG tablet TAKE ONE & ONE-HALF TABLETS BY MOUTH ONCE DAILY 45 tablet 1  . TURMERIC PO Take by mouth.     No facility-administered medications prior to visit.       Past Surgical History:  Procedure Laterality  Date  . ANUS SURGERY    . MOHS SURGERY     Precancerous skin lesion  . Removal of cyst & other ovary  09/2004  . Removal of fallopion tube & ovary  1986  . Unilateral Salpingo-Oophorectomy       Past Medical History:  Diagnosis Date  . ALLERGIC RHINITIS   . ANXIETY   . DEPRESSION   . Diabetes mellitus ([place])   . HYPERLIPIDEMIA   . HYPERTENSION   . Internal hemorrhoids without mention of complication [id number]   Colonoscopy--Dr. Fuller Plan , pts. states hemorrhoids are uncomfortable  . OSTEOPENIA   . TOBACCO USE, QUIT      Review of Systems  Constitutional: Negative for chills and fever.  Respiratory: Negative for  chest tightness and shortness of breath.   Cardiovascular: Negative for chest pain, palpitations and leg swelling.      Objective:    BP 128/82 (BP Location: Left Arm, Patient Position: Sitting, Cuff Size: Large)   Pulse 98   Temp 98.6 F (37 C) (Oral)   Resp 16   Ht 5\' 4"  (1.626 m)   Wt 245 lb (111.1 kg)   SpO2 97%   BMI 42.05 kg/m  Nursing note and vital signs reviewed.  Physical Exam  Constitutional: She is oriented to person, place, and time. She appears well-developed and well-nourished. No distress.  Cardiovascular: Normal rate, regular rhythm, normal heart sounds and intact distal pulses.   Pulmonary/Chest: Effort normal and breath sounds normal.  Musculoskeletal:  Left knee - no obvious deformity or discoloration with mild/moderate edema. There is generalized tenderness along the anterior medial/lateral joint lines. Range of motion within normal limits. Distal pulses and sensation are intact and appropriate. Ligamentous and meniscal testing are negative.   Neurological: She is alert and oriented to person, place, and time.  Skin: Skin is warm and dry.  Psychiatric: She has a normal mood and affect. Her behavior is normal. Judgment and thought content normal.   Procedure: Corticosteroid injection of the left knee  Description: Informed consent was obtained with discussion including risks and benefits of the procedure. Patient verbally wished to continued. Time out was performed. The site was marked and identified. It was cleansed with betadine using a concentric circular pattern. Skin anesthesia was applied using cold spray applied for 10 seconds. Injection of 81ml :4 ml of Kenalog (40 mg / ml) to Sensoricaine was injected following aspiration with no return noted. Following the injection there was instant relief confirming appropriate placement. A bandage was applied to the area and post care instructions were provided. The procedure was tolerated well with no complications.        Assessment & Plan:   Problem List Items Addressed This Visit      Musculoskeletal and Integument   Degenerative arthritis of left knee    Symptoms and exam remain consistent with osteoarthritis of the left knee that has been refractory to conservative treatments of ice, compression and home exercise therapy. Corticosteroid injection provided without complication. Continue with icing regimen. Consider hyaluronic acid injection if symptoms do not improve with injection. Recommend weight loss as this is a significant contributing factor. Continue to monitor.         Other   Obesity    BMI of 42 with recommended weight loss of 5-10% of current body weight through nutrition and physical activity. Discussed importance of nutrition choices that her varied, moderate, and balanced. Gradually increase physical activity as tolerated. Recommended aquatic therapy to start to help  with weight loss and for ease of pressure on her knees. May also consider cycling. Medication as an option with choices provided including Contrave, Belviq and Saxenda. Continue to monitor.           I am having Ms. Traber maintain her B-D SINGLE USE SWABS REGULAR, TURMERIC PO, omega-3 fish oil, diphenhydrAMINE, Diclofenac Sodium, Ibuprofen-Famotidine, fluconazole, Insulin Syringe-Needle U-100, Insulin Pen Needle, HUMULIN R U-500 KWIKPEN, sertraline, lovastatin, metFORMIN, canagliflozin, hydrochlorothiazide, and clonazePAM.   A total of 25 minutes were spent face-to-face with the patient during this encounter and over half of that time was spent on counseling and coordination of care.  We discussed in depth the importance of weight loss, weight loss medications, nutrition and exercise. Also discussed plan of care for the left knee if symptoms worsen or do not improve.    Follow-up: Return in about 1 month (around 01/19/2017), or if symptoms worsen or fail to improve.  Mauricio Po, FNP

## 2016-12-26 ENCOUNTER — Ambulatory Visit (INDEPENDENT_AMBULATORY_CARE_PROVIDER_SITE_OTHER): Payer: BLUE CROSS/BLUE SHIELD | Admitting: Psychology

## 2016-12-26 DIAGNOSIS — F331 Major depressive disorder, recurrent, moderate: Secondary | ICD-10-CM

## 2017-01-12 ENCOUNTER — Ambulatory Visit: Payer: BLUE CROSS/BLUE SHIELD | Admitting: Psychology

## 2017-01-18 ENCOUNTER — Other Ambulatory Visit: Payer: Self-pay | Admitting: Family

## 2017-01-18 DIAGNOSIS — F329 Major depressive disorder, single episode, unspecified: Secondary | ICD-10-CM

## 2017-01-18 DIAGNOSIS — F32A Depression, unspecified: Secondary | ICD-10-CM

## 2017-02-06 ENCOUNTER — Telehealth: Payer: Self-pay | Admitting: Endocrinology

## 2017-02-06 NOTE — Telephone Encounter (Signed)
Pt called in about her Invokana, she stated that this year it is too expensive monthly and last year she got it with no charge because she had a discount card.  She was wondering if this is possible this year.  She is out as of today and needs to hear something back as soon as possible. Please advise and call patient.

## 2017-02-06 NOTE — Telephone Encounter (Signed)
Spoke with patient and she is aware and will being [employer]

## 2017-02-06 NOTE — Telephone Encounter (Signed)
Is not clear what she is asking.  She needs to find out from the pharmacist if the co-pay card is still working.  Otherwise she can check on the insurance coverage for Jardiance, Farxiga, we have co-pay cards for these

## 2017-02-21 ENCOUNTER — Other Ambulatory Visit: Payer: BLUE CROSS/BLUE SHIELD

## 2017-02-27 ENCOUNTER — Ambulatory Visit: Payer: Self-pay | Admitting: Endocrinology

## 2017-02-28 ENCOUNTER — Telehealth: Payer: Self-pay | Admitting: Endocrinology

## 2017-02-28 NOTE — Telephone Encounter (Signed)
Same dose 

## 2017-02-28 NOTE — Telephone Encounter (Signed)
Patient asked what should shedo if her b/s is125 or lower what dose should she be taking, please advise

## 2017-02-28 NOTE — Telephone Encounter (Signed)
Spoke with patient and she stated an understanding

## 2017-03-14 ENCOUNTER — Other Ambulatory Visit (INDEPENDENT_AMBULATORY_CARE_PROVIDER_SITE_OTHER): Payer: BLUE CROSS/BLUE SHIELD

## 2017-03-14 DIAGNOSIS — Z794 Long term (current) use of insulin: Secondary | ICD-10-CM | POA: Diagnosis not present

## 2017-03-14 DIAGNOSIS — E1165 Type 2 diabetes mellitus with hyperglycemia: Secondary | ICD-10-CM | POA: Diagnosis not present

## 2017-03-14 LAB — COMPREHENSIVE METABOLIC PANEL
ALBUMIN: 4.1 g/dL (ref 3.5–5.2)
ALT: 19 U/L (ref 0–35)
AST: 13 U/L (ref 0–37)
Alkaline Phosphatase: 70 U/L (ref 39–117)
BUN: 19 mg/dL (ref 6–23)
CALCIUM: 9.6 mg/dL (ref 8.4–10.5)
CHLORIDE: 101 meq/L (ref 96–112)
CO2: 30 meq/L (ref 19–32)
Creatinine, Ser: 0.77 mg/dL (ref 0.40–1.20)
GFR: 80.69 mL/min (ref >60.00)
Glucose, Bld: 112 mg/dL — ABNORMAL HIGH (ref 70–99)
Potassium: 3.6 mEq/L (ref 3.5–5.1)
Sodium: 137 mEq/L (ref 135–145)
Total Bilirubin: 0.5 mg/dL (ref 0.2–1.2)
Total Protein: 7.6 g/dL (ref 6.0–8.3)

## 2017-03-14 LAB — LDL CHOLESTEROL, DIRECT: LDL DIRECT: 83 mg/dL

## 2017-03-14 LAB — LIPID PANEL
CHOLESTEROL: 146 mg/dL (ref 0–200)
HDL: 39 mg/dL — AB (ref >39.00)
NONHDL: 106.97
TRIGLYCERIDES: 205 mg/dL — AB (ref 0.0–149.0)
Total CHOL/HDL Ratio: 4
VLDL: 41 mg/dL — ABNORMAL HIGH (ref 0.0–40.0)

## 2017-03-14 LAB — HEMOGLOBIN A1C: Hgb A1c MFr Bld: 6 % (ref 4.6–6.5)

## 2017-03-14 LAB — MICROALBUMIN / CREATININE URINE RATIO
Creatinine,U: 51.1 mg/dL
Microalb Creat Ratio: 3.7 mg/g (ref 0.0–30.0)
Microalb, Ur: 1.9 mg/dL (ref 0.0–1.9)

## 2017-03-15 ENCOUNTER — Other Ambulatory Visit: Payer: Self-pay | Admitting: Family

## 2017-03-19 ENCOUNTER — Ambulatory Visit: Payer: Self-pay | Admitting: Endocrinology

## 2017-03-19 ENCOUNTER — Other Ambulatory Visit: Payer: Self-pay | Admitting: Family

## 2017-03-19 NOTE — Telephone Encounter (Signed)
Pt called again about her medication.

## 2017-03-20 NOTE — Telephone Encounter (Signed)
Medication has been faxed

## 2017-03-20 NOTE — Telephone Encounter (Signed)
See message I cannot see where this test has been checked recently.

## 2017-03-20 NOTE — Telephone Encounter (Signed)
Patient is calling for the result of her urine test, she think she has a yeast infection.

## 2017-03-21 NOTE — Telephone Encounter (Signed)
We only did the microalbumin, she will have to get a urinalysis from PCP if she is concerned about UTI.  If she having whitish discharge and itching will need Diflucan 150 mg tablet

## 2017-03-22 NOTE — Telephone Encounter (Signed)
I contacted the patient and advised of message via voicemail. Requested a call back if the patient would like to discuss further.  

## 2017-03-26 ENCOUNTER — Other Ambulatory Visit: Payer: Self-pay | Admitting: Family

## 2017-03-27 ENCOUNTER — Encounter: Payer: Self-pay | Admitting: Endocrinology

## 2017-03-27 ENCOUNTER — Ambulatory Visit (INDEPENDENT_AMBULATORY_CARE_PROVIDER_SITE_OTHER): Payer: BLUE CROSS/BLUE SHIELD | Admitting: Endocrinology

## 2017-03-27 VITALS — BP 130/86 | HR 83 | Ht 64.0 in | Wt 236.8 lb

## 2017-03-27 DIAGNOSIS — E782 Mixed hyperlipidemia: Secondary | ICD-10-CM

## 2017-03-27 DIAGNOSIS — E1165 Type 2 diabetes mellitus with hyperglycemia: Secondary | ICD-10-CM | POA: Diagnosis not present

## 2017-03-27 DIAGNOSIS — I10 Essential (primary) hypertension: Secondary | ICD-10-CM | POA: Diagnosis not present

## 2017-03-27 DIAGNOSIS — Z794 Long term (current) use of insulin: Secondary | ICD-10-CM

## 2017-03-27 NOTE — Patient Instructions (Addendum)
Check blood sugars on waking up 3-4x daily   Also check blood sugars about 2 hours after a meal and do this after different meals by rotation  Recommended blood sugar levels on waking up is 90-130 and about 2 hours after meal is 130-160  Please bring your blood sugar monitor to each visit, thank you  Stay on same doses

## 2017-03-27 NOTE — Progress Notes (Signed)
Patient ID: Destiny Tucker, female   DOB: 11-03-1956, 61 y.o.   MRN: [id number]   Reason for Appointment: Diabetes follow-up   History of Present Illness   Diagnosis: Type 2 DIABETES MELITUS, date of diagnosis:  08/2010     Previous history: She has been on various regimens for her diabetes including [place] in the past. Because of progressively higher fasting readings she was started on Levemir insulin in 2013 She usually tends to have relatively high fasting readings  Recent history:   Insulin regimen:  Humulin R U-500, 45 units for  brunch and 50 units at supper Oral hypoglycemic drugs: Metformin 1g hs, Invokana 100 mg daily    Because of poor control with Levemir and Humalog she was started on Humulin R U-500 in 1/17 She was also started on Invokana 100 mg daily at that time With this her blood sugars have improved consistently However she has not been seen in follow-up since 11/17  A1c is 6%, previously 6.4  Current management, blood sugar patterns and problems:  The last few weeks she says she has gained her diet significantly with cutting back significantly on carbohydrates, high-fat foods, less snacks and has lost significant amount of weight  However she is checking blood sugars only in the mornings lately; these are significantly better and previously were averaging 170  FASTING readings are now averaging about 120  She has only one reading at suppertime 95  She forgets to check her readings after supper although was doing better last time  She is asking about whether to take insulin when her blood sugars are in the normal range  She is taking somewhat less insulin than before, was on 60 units at supper on her last visit  No recent hypoglycemia  She is trying to get more active with yard work or little home exercise regimen  No persistent issues with candidiasis and has taken Diflucan only as needed at times  Side effects from medications: None   Monitors blood glucose:  0-1 times a day.    Glucometer: One Touch 2         Blood Glucose readings from meter  Mean values apply above for all meters except median for One Touch  PRE-MEAL Fasting Lunch Dinner Bedtime Overall  Glucose range: 78-153      Mean/median: 120     117    Breakfast 11 AM, dinner 7-8 pm; snacks at times        Physical activiity: yardwork, limited by knee pain           Dietician visit: Most recent: At diagnosis and with nurse educator periodically      Weight control:  Wt Readings from Last 3 Encounters:  03/27/17 236 lb 12.8 oz (107.4 kg)  12/22/16 245 lb (111.1 kg)  11/16/16 248 lb (112.5 kg)         Diabetes labs:  Lab Results  Component Value Date   HGBA1C 6.0 03/14/2017   HGBA1C 6.4 10/02/2016   HGBA1C 6.3 05/19/2016   Lab Results  Component Value Date   MICROALBUR 1.9 03/14/2017   LDLCALC 59 01/10/2016   CREATININE 0.77 03/14/2017    Allergies as of 03/27/2017      Reactions   Codeine       Medication List       Accurate as of 03/27/17  4:42 PM. Always use your most recent med list.          B-D SINGLE USE  SWABS REGULAR Pads USE AS DIRECTED TWICE DAILY   canagliflozin 100 MG Tabs tablet Commonly known as:  INVOKANA TAKE ONE TABLET BY MOUTH ONCE DAILY BEFORE  BREAKFAST   clonazePAM 1 MG tablet Commonly known as:  KLONOPIN TAKE ONE TABLET BY MOUTH TWICE DAILY AS NEEDED FOR ANXIETY   Diclofenac Sodium 2 % Soln Commonly known as:  PENNSAID Place 1 application onto the skin 2 (two) times daily as needed.   diphenhydrAMINE 25 MG tablet Commonly known as:  BENADRYL Take 25 mg by mouth every 6 (six) hours as needed.   fluconazole 150 MG tablet Commonly known as:  DIFLUCAN TAKE ONE TABLET BY MOUTH AS A ONE-TIME DOSE   HUMULIN R U-500 KWIKPEN 500 UNIT/ML kwikpen Generic drug:  insulin regular human CONCENTRATED INJECT 45 UNITS INTO THE SKIN IN THE MORNING AND 55 UNITS IN THE EVENING   hydrochlorothiazide 12.5 MG  tablet Commonly known as:  HYDRODIURIL Take 1 tablet (12.5 mg total) by mouth daily.   Ibuprofen-Famotidine 800-26.6 MG Tabs Take 1 tablet by mouth 3 (three) times daily as needed.   Insulin Pen Needle 32G X 6 MM Misc Commonly known as:  NOVOFINE Use to inject 2 times per day   Insulin Syringe-Needle U-100 31G X 5/16" 0.3 ML Misc Commonly known as:  B-D INSULIN SYRINGE Use 2 syringes daily with Levemir Insulin   lovastatin 20 MG tablet Commonly known as:  MEVACOR TAKE ONE TABLET BY MOUTH ONCE DAILY AT BEDTIME   metFORMIN 1000 MG tablet Commonly known as:  GLUCOPHAGE TAKE ONE TABLET BY MOUTH TWICE DAILY WITH FOOD   omega-3 fish oil 1000 MG Caps capsule Commonly known as:  MAXEPA Take by mouth.   sertraline 100 MG tablet Commonly known as:  ZOLOFT TAKE ONE AND ONE-HALF TABLETS BY MOUTH ONCE DAILY   TURMERIC PO Take by mouth.       Allergies:  Allergies  Allergen Reactions  . Codeine     Past Medical History:  Diagnosis Date  . ALLERGIC RHINITIS   . ANXIETY   . DEPRESSION   . Diabetes mellitus ([place])   . HYPERLIPIDEMIA   . HYPERTENSION   . Internal hemorrhoids without mention of complication 09/16/2535   Colonoscopy--Dr. Fuller Plan , pts. states hemorrhoids are uncomfortable  . OSTEOPENIA   . TOBACCO USE, QUIT     Past Surgical History:  Procedure Laterality Date  . ANUS SURGERY    . MOHS SURGERY     Precancerous skin lesion  . Removal of cyst & other ovary  09/2004  . Removal of fallopion tube & ovary  1986  . Unilateral Salpingo-Oophorectomy      Family History  Problem Relation Age of Onset  . Hypertension Mother   . Hyperlipidemia Mother   . Nephrolithiasis Mother   . Diabetes Mother   . Hypertension Father   . Hyperlipidemia Father   . Nephrolithiasis Father   . Diabetes Maternal Uncle     Social History:  reports that she quit smoking about 7 years ago. She has quit using smokeless tobacco. She reports that she drinks alcohol. She reports  that she does not use drugs.  Review of Systems:  Hypertension:  Has been treated with HCTZ only because of tendency to edema Also on Invokana Blood pressure is relatively higher today, she is anxious  BP Readings from Last 3 Encounters:  03/27/17 130/86  12/22/16 128/82  11/16/16 138/84    Edema: Resolved  Lipids: She is  on 20 mg  mg of lovastatin with good control of LDL HDL is below 40  Lab Results  Component Value Date   CHOL 146 03/14/2017   HDL 39.00 (L) 03/14/2017   LDLCALC 59 01/10/2016   LDLDIRECT 83.0 03/14/2017   TRIG 205.0 (H) 03/14/2017   CHOLHDL 4 03/14/2017       LABS:  No visits with results within 1 Week(s) from this visit.  Latest known visit with results is:  Lab on 03/14/2017  Component Date Value Ref Range Status  . Hgb A1c MFr Bld 03/14/2017 6.0  4.6 - 6.5 % Final  . Sodium 03/14/2017 137  135 - 145 mEq/L Final  . Potassium 03/14/2017 3.6  3.5 - 5.1 mEq/L Final  . Chloride 03/14/2017 101  96 - 112 mEq/L Final  . CO2 03/14/2017 30  19 - 32 mEq/L Final  . Glucose, Bld 03/14/2017 112* 70 - 99 mg/dL Final  . BUN 03/14/2017 19  6 - 23 mg/dL Final  . Creatinine, Ser 03/14/2017 0.77  0.40 - 1.20 mg/dL Final  . Total Bilirubin 03/14/2017 0.5  0.2 - 1.2 mg/dL Final  . Alkaline Phosphatase 03/14/2017 70  39 - 117 U/L Final  . AST 03/14/2017 13  0 - 37 U/L Final  . ALT 03/14/2017 19  0 - 35 U/L Final  . Total Protein 03/14/2017 7.6  6.0 - 8.3 g/dL Final  . Albumin 03/14/2017 4.1  3.5 - 5.2 g/dL Final  . Calcium 03/14/2017 9.6  8.4 - 10.5 mg/dL Final  . GFR 03/14/2017 80.69  >60.00 mL/min Final  . Cholesterol 03/14/2017 146  0 - 200 mg/dL Final  . Triglycerides 03/14/2017 205.0* 0.0 - 149.0 mg/dL Final  . HDL 03/14/2017 39.00* >39.00 mg/dL Final  . VLDL 03/14/2017 41.0* 0.0 - 40.0 mg/dL Final  . Total CHOL/HDL Ratio 03/14/2017 4   Final  . NonHDL 03/14/2017 106.97   Final  . Microalb, Ur 03/14/2017 1.9  0.0 - 1.9 mg/dL Final  . Creatinine,U  03/14/2017 51.1  mg/dL Final  . Microalb Creat Ratio 03/14/2017 3.7  0.0 - 30.0 mg/g Final  . Direct LDL 03/14/2017 83.0  mg/dL Final     Examination:   BP 130/86 (Cuff Size: Large)   Pulse 83   Ht 5\' 4"  (1.626 m)   Wt 236 lb 12.8 oz (107.4 kg)   SpO2 98%   BMI 40.65 kg/m   Body mass index is 40.65 kg/m.   Diabetic Foot Exam - Simple   Simple Foot Form Diabetic Foot exam was performed with the following findings:  Yes 03/27/2017  4:34 PM  Visual Inspection No deformities, no ulcerations, no other skin breakdown bilaterally:  Yes Sensation Testing Intact to touch and monofilament testing bilaterally:  Yes Pulse Check Posterior Tibialis and Dorsalis pulse intact bilaterally:  Yes Comments      ASSESSMENT/ PLAN:   Diabetes type 2 , BMI 43    See history of present illness for detailed discussion of current management, blood sugar patterns and problems identified  She has had excellent control with A1c now 6%, previously 6.4  She appears to have lost significant amount of weight although she reports some regain This is with significantly improving her diet overall Starting to be a little more active also Requiring slightly less insulin However she is not monitoring blood sugars after meals, discussed postprandial targets  She needs to continue taking insulin on the mornings when her blood sugars are near normal, may at the most reduce the dose by  5 units if below 90 For now she will continue her regimen unchanged Encouraged her to check readings after meals, discussed postprandial targets Encourage her to have protein with every meal Needs to increase exercise also  Foot exam does not appear to show neuropathy  LIPIDS: Good control of LDL  HYPERTENSION: Blood pressure is relatively high today, continue to monitor and also follow-up with PCP    Patient Instructions  Check blood sugars on waking up 3-4x daily   Also check blood sugars about 2 hours after a meal and  do this after different meals by rotation  Recommended blood sugar levels on waking up is 90-130 and about 2 hours after meal is 130-160  Please bring your blood sugar monitor to each visit, thank you  Stay on same doses  Counseling time on subjects discussed above is over 50% of today's 25 minute visit   [place] 03/27/2017, 4:42 PM

## 2017-04-09 ENCOUNTER — Ambulatory Visit: Payer: Self-pay

## 2017-04-09 ENCOUNTER — Ambulatory Visit: Payer: BLUE CROSS/BLUE SHIELD

## 2017-04-10 ENCOUNTER — Telehealth: Payer: Self-pay | Admitting: Endocrinology

## 2017-04-10 ENCOUNTER — Telehealth: Payer: Self-pay | Admitting: Family

## 2017-04-10 ENCOUNTER — Other Ambulatory Visit: Payer: Self-pay

## 2017-04-10 MED ORDER — FLUCONAZOLE 150 MG PO TABS: ORAL_TABLET | ORAL | 1 refills | Status: DC

## 2017-04-10 NOTE — Telephone Encounter (Signed)
Med has been sent

## 2017-04-10 NOTE — Telephone Encounter (Signed)
Pt called stating that she was diagnosed with a yeast infection by Dr Kinnie Scales. She believes it could be coming from [place]. She left a message with her endocrinologist but has not heard back. Please advise.

## 2017-04-10 NOTE — Telephone Encounter (Signed)
Patient stated she had a urine test done at Dr Kinnie Scales office and she has a bad yeast infections, she is thinking it is from the [place]. Please advise

## 2017-04-11 NOTE — Telephone Encounter (Signed)
What symptoms is she having?

## 2017-04-11 NOTE — Telephone Encounter (Signed)
Please advise 

## 2017-04-11 NOTE — Telephone Encounter (Signed)
Called patient and she stated that she was having clinical trials at [facility] by Dr. Kinnie Scales and they check her urine and [place] treated her for her symptoms this time. She asked if there is anything better to take other than the Invokana? She read that this is prone to give yeast infections. She is worried that she will continue to have yeast and may turn into a UTI possibly. Please advise.

## 2017-04-11 NOTE — Telephone Encounter (Signed)
If she is not having any symptoms of the yeast infection we do not need to treat this. I will need to see the actual urine report If she is going to stop Invokana she will have weight gain and higher blood sugars and so far it has done very well.  I do not want to really stop it and there is no alternative

## 2017-04-12 NOTE — Telephone Encounter (Signed)
Called patient and left a voice message to call back to go over note from Dr. Dwyane Dee.

## 2017-04-12 NOTE — Telephone Encounter (Signed)
Patient returning call from [place].   Thank you,  -LL

## 2017-04-12 NOTE — Telephone Encounter (Signed)
Called patient to advise her of Dr. Ronnie Derby message. She is concerned about the long term effects of taking the Invokana. Patient states that she has only really had some itching from the yeast infections. Patient also states that she will continue taking the [place].

## 2017-04-18 ENCOUNTER — Other Ambulatory Visit: Payer: Self-pay | Admitting: Family

## 2017-04-18 DIAGNOSIS — F32A Depression, unspecified: Secondary | ICD-10-CM

## 2017-04-18 DIAGNOSIS — F329 Major depressive disorder, single episode, unspecified: Secondary | ICD-10-CM

## 2017-05-09 ENCOUNTER — Other Ambulatory Visit: Payer: Self-pay | Admitting: Endocrinology

## 2017-05-18 ENCOUNTER — Other Ambulatory Visit: Payer: Self-pay | Admitting: Endocrinology

## 2017-05-21 ENCOUNTER — Encounter: Payer: Self-pay | Admitting: Family

## 2017-05-21 ENCOUNTER — Ambulatory Visit (INDEPENDENT_AMBULATORY_CARE_PROVIDER_SITE_OTHER): Payer: BLUE CROSS/BLUE SHIELD | Admitting: Family

## 2017-05-21 VITALS — BP 132/86 | HR 83 | Temp 97.9°F | Resp 16 | Ht 64.0 in | Wt 241.0 lb

## 2017-05-21 DIAGNOSIS — Z7289 Other problems related to lifestyle: Secondary | ICD-10-CM | POA: Diagnosis not present

## 2017-05-21 DIAGNOSIS — Z6841 Body Mass Index (BMI) 40.0 and over, adult: Secondary | ICD-10-CM

## 2017-05-21 DIAGNOSIS — Z0000 Encounter for general adult medical examination without abnormal findings: Secondary | ICD-10-CM | POA: Diagnosis not present

## 2017-05-21 DIAGNOSIS — Z23 Encounter for immunization: Secondary | ICD-10-CM

## 2017-05-21 NOTE — Assessment & Plan Note (Signed)
1) Anticipatory Guidance: Discussed importance of wearing a seatbelt while driving and not texting while driving; changing batteries in smoke detector at least once annually; wearing suntan lotion when outside; eating a balanced and moderate diet; getting physical activity at least 30 minutes per day.  2) Immunizations / Screenings / Labs:  Tetanus updated today. All other immunizations are up-to-date per recommendations. Due for a dental exam encouraged to be completed independently. Due for cervical cancer screening with recommended follow-up with gynecology. Obtain hepatitis C antibody for hepatitis C screening. Breast and colon cancer screenings are up-to-date. All other screenings are up-to-date per recommendations. Obtain CBC, CMET, and lipid profile.    Overall well exam with risk factors for cardiovascular disease including diabetes, hypertension, hyperlipidemia, and obesity. Recommend aggressive weight loss of 5-10% of current body weight through nutrition and physical activity. Consume a nutritional intake that is moderate, balance, and varied. Continue other healthy lifestyle behaviors and choices. Follow-up prevention exam in 1 year. Follow-up office visit pending blood work and for chronic conditions.

## 2017-05-21 NOTE — Assessment & Plan Note (Signed)
Recommend aggressive weight loss resulting in multiple comorbid conditions. Previously was able to lose approximately 7 pounds but has since regained the weight. Encouraged a nutritional intake that is moderate, balance, and varied. Aim for 1200-1500 cal daily higher in protein and low in saturated/processed sugary foods and saturated fats. Consider Weight Watchers, [employer] Patience, or Nutrisystem. If unsuccessful may require bariatric intervention.[name]

## 2017-05-21 NOTE — Patient Instructions (Signed)
Thank you for choosing [facility].  SUMMARY AND INSTRUCTIONS:  Please continue to take your medications as prescribed.  Please complete your blood work when fasting.   Recommend a nutritional intake that is moderate, balanced and varied.  Aim for 1200-1500 calories daily that emphasizes lean meats and protein similar to a Mediterranian Diet (information below)  Please follow up with Gynecology for a Pap Smear   Labs:  Please stop by the lab on the lower level of the building for your blood work. Your results will be released to [place] (or called to you) after review, usually within 72 hours after test completion. If any changes need to be made, you will be notified at that same time.  1.) The lab is open from 7:30am to 5:30 pm Monday-Friday 2.) No appointment is necessary 3.) Fasting (if needed) is 6-8 hours after food and drink; black coffee and water are okay   Follow up:  If your symptoms worsen or fail to improve, please contact our office for further instruction, or in case of emergency go directly to the emergency room at the closest medical facility.   Health Maintenance, Female Adopting a healthy lifestyle and getting preventive care can go a long way to promote health and wellness. Talk with your health care provider about what schedule of regular examinations is right for you. This is a good chance for you to check in with your provider about disease prevention and staying healthy. In between checkups, there are plenty of things you can do on your own. Experts have done a lot of research about which lifestyle changes and preventive measures are most likely to keep you healthy. Ask your health care provider for more information. Weight and diet Eat a healthy diet  Be sure to include plenty of vegetables, fruits, low-fat dairy products, and lean protein.  Do not eat a lot of foods high in solid fats, added sugars, or salt.  Get regular exercise. This is one of the  most important things you can do for your health. ? Most adults should exercise for at least 150 minutes each week. The exercise should increase your heart rate and make you sweat (moderate-intensity exercise). ? Most adults should also do strengthening exercises at least twice a week. This is in addition to the moderate-intensity exercise.  Maintain a healthy weight  Body mass index (BMI) is a measurement that can be used to identify possible weight problems. It estimates body fat based on height and weight. Your health care provider can help determine your BMI and help you achieve or maintain a healthy weight.  For females 74 years of age and older: ? A BMI below 18.5 is considered underweight. ? A BMI of 18.5 to 24.9 is normal. ? A BMI of 25 to 29.9 is considered overweight. ? A BMI of 30 and above is considered obese.  Watch levels of cholesterol and blood lipids  You should start having your blood tested for lipids and cholesterol at 62 years of age, then have this test every 5 years.  You may need to have your cholesterol levels checked more often if: ? Your lipid or cholesterol levels are high. ? You are older than 62 years of age. ? You are at high risk for heart disease.  Cancer screening Lung Cancer  Lung cancer screening is recommended for adults 57-70 years old who are at high risk for lung cancer because of a history of smoking.  A yearly low-dose CT scan of  the lungs is recommended for people who: ? Currently smoke. ? Have quit within the past 15 years. ? Have at least a 30-pack-year history of smoking. A pack year is smoking an average of one pack of cigarettes a day for 1 year.  Yearly screening should continue until it has been 15 years since you quit.  Yearly screening should stop if you develop a health problem that would prevent you from having lung cancer treatment.  Breast Cancer  Practice breast self-awareness. This means understanding how your breasts  normally appear and feel.  It also means doing regular breast self-exams. Let your health care provider know about any changes, no matter how small.  If you are in your 20s or 30s, you should have a clinical breast exam (CBE) by a health care provider every 1-3 years as part of a regular health exam.  If you are 47 or older, have a CBE every year. Also consider having a breast X-ray (mammogram) every year.  If you have a family history of breast cancer, talk to your health care provider about genetic screening.  If you are at high risk for breast cancer, talk to your health care provider about having an MRI and a mammogram every year.  Breast cancer gene (BRCA) assessment is recommended for women who have family members with BRCA-related cancers. BRCA-related cancers include: ? Breast. ? Ovarian. ? Tubal. ? Peritoneal cancers.  Results of the assessment will determine the need for genetic counseling and BRCA1 and BRCA2 testing.  Cervical Cancer Your health care provider may recommend that you be screened regularly for cancer of the pelvic organs (ovaries, uterus, and vagina). This screening involves a pelvic examination, including checking for microscopic changes to the surface of your cervix (Pap test). You may be encouraged to have this screening done every 3 years, beginning at age 51.  For women ages 43-65, health care providers may recommend pelvic exams and Pap testing every 3 years, or they may recommend the Pap and pelvic exam, combined with testing for human papilloma virus (HPV), every 5 years. Some types of HPV increase your risk of cervical cancer. Testing for HPV may also be done on women of any age with unclear Pap test results.  Other health care providers may not recommend any screening for nonpregnant women who are considered low risk for pelvic cancer and who do not have symptoms. Ask your health care provider if a screening pelvic exam is right for you.  If you have had  past treatment for cervical cancer or a condition that could lead to cancer, you need Pap tests and screening for cancer for at least 20 years after your treatment. If Pap tests have been discontinued, your risk factors (such as having a new sexual partner) need to be reassessed to determine if screening should resume. Some women have medical problems that increase the chance of getting cervical cancer. In these cases, your health care provider may recommend more frequent screening and Pap tests.  Colorectal Cancer  This type of cancer can be detected and often prevented.  Routine colorectal cancer screening usually begins at 62 years of age and continues through 62 years of age.  Your health care provider may recommend screening at an earlier age if you have risk factors for colon cancer.  Your health care provider may also recommend using home test kits to check for hidden blood in the stool.  A small camera at the end of a tube can be used  to examine your colon directly (sigmoidoscopy or colonoscopy). This is done to check for the earliest forms of colorectal cancer.  Routine screening usually begins at age 67.  Direct examination of the colon should be repeated every 5-10 years through 62 years of age. However, you may need to be screened more often if early forms of precancerous polyps or small growths are found.  Skin Cancer  Check your skin from head to toe regularly.  Tell your health care provider about any new moles or changes in moles, especially if there is a change in a mole's shape or color.  Also tell your health care provider if you have a mole that is larger than the size of a pencil eraser.  Always use sunscreen. Apply sunscreen liberally and repeatedly throughout the day.  Protect yourself by wearing long sleeves, pants, a wide-brimmed hat, and sunglasses whenever you are outside.  Heart disease, diabetes, and high blood pressure  High blood pressure causes heart  disease and increases the risk of stroke. High blood pressure is more likely to develop in: ? People who have blood pressure in the high end of the normal range (130-139/85-89 mm Hg). ? People who are overweight or obese. ? People who are African American.  If you are 89-35 years of age, have your blood pressure checked every 3-5 years. If you are 42 years of age or older, have your blood pressure checked every year. You should have your blood pressure measured twice-once when you are at a hospital or clinic, and once when you are not at a hospital or clinic. Record the average of the two measurements. To check your blood pressure when you are not at a hospital or clinic, you can use: ? An automated blood pressure machine at a pharmacy. ? A home blood pressure monitor.  If you are between 43 years and 22 years old, ask your health care provider if you should take aspirin to prevent strokes.  Have regular diabetes screenings. This involves taking a blood sample to check your fasting blood sugar level. ? If you are at a normal weight and have a low risk for diabetes, have this test once every three years after 62 years of age. ? If you are overweight and have a high risk for diabetes, consider being tested at a younger age or more often. Preventing infection Hepatitis B  If you have a higher risk for hepatitis B, you should be screened for this virus. You are considered at high risk for hepatitis B if: ? You were born in a country where hepatitis B is common. Ask your health care provider which countries are considered high risk. ? Your parents were born in a high-risk country, and you have not been immunized against hepatitis B (hepatitis B vaccine). ? You have HIV or AIDS. ? You use needles to inject street drugs. ? You live with someone who has hepatitis B. ? You have had sex with someone who has hepatitis B. ? You get hemodialysis treatment. ? You take certain medicines for conditions,  including cancer, organ transplantation, and autoimmune conditions.  Hepatitis C  Blood testing is recommended for: ? Everyone born from [birth year] through 1966. ? Anyone with known risk factors for hepatitis C.  Sexually transmitted infections (STIs)  You should be screened for sexually transmitted infections (STIs) including gonorrhea and chlamydia if: ? You are sexually active and are younger than 63 years of age. ? You are older than 62 years of age  and your health care provider tells you that you are at risk for this type of infection. ? Your sexual activity has changed since you were last screened and you are at an increased risk for chlamydia or gonorrhea. Ask your health care provider if you are at risk.  If you do not have HIV, but are at risk, it may be recommended that you take a prescription medicine daily to prevent HIV infection. This is called pre-exposure prophylaxis (PrEP). You are considered at risk if: ? You are sexually active and do not regularly use condoms or know the HIV status of your partner(s). ? You take drugs by injection. ? You are sexually active with a partner who has HIV.  Talk with your health care provider about whether you are at high risk of being infected with HIV. If you choose to begin PrEP, you should first be tested for HIV. You should then be tested every 3 months for as long as you are taking PrEP. Pregnancy  If you are premenopausal and you may become pregnant, ask your health care provider about preconception counseling.  If you may become pregnant, take 400 to 800 micrograms (mcg) of folic acid every day.  If you want to prevent pregnancy, talk to your health care provider about birth control (contraception). Osteoporosis and menopause  Osteoporosis is a disease in which the bones lose minerals and strength with aging. This can result in serious bone fractures. Your risk for osteoporosis can be identified using a bone density scan.  If you are  70 years of age or older, or if you are at risk for osteoporosis and fractures, ask your health care provider if you should be screened.  Ask your health care provider whether you should take a calcium or vitamin D supplement to lower your risk for osteoporosis.  Menopause may have certain physical symptoms and risks.  Hormone replacement therapy may reduce some of these symptoms and risks. Talk to your health care provider about whether hormone replacement therapy is right for you. Follow these instructions at home:  Schedule regular health, dental, and eye exams.  Stay current with your immunizations.  Do not use any tobacco products including cigarettes, chewing tobacco, or electronic cigarettes.  If you are pregnant, do not drink alcohol.  If you are breastfeeding, limit how much and how often you drink alcohol.  Limit alcohol intake to no more than 1 drink per day for nonpregnant women. One drink equals 12 ounces of beer, 5 ounces of wine, or 1 ounces of hard liquor.  Do not use street drugs.  Do not share needles.  Ask your health care provider for help if you need support or information about quitting drugs.  Tell your health care provider if you often feel depressed.  Tell your health care provider if you have ever been abused or do not feel safe at home. This information is not intended to replace advice given to you by your health care provider. Make sure you discuss any questions you have with your health care provider. Document Released: 05/22/2011 Document Revised: 04/13/2016 Document Reviewed: 08/10/2015 Elsevier Interactive Patient Education  [employer].

## 2017-05-21 NOTE — Progress Notes (Signed)
Subjective:    Patient ID: Destiny Tucker, female    DOB: [birth year], 61 y.o.   MRN: [id number]  Chief Complaint  Patient presents with  . CPE    not fasting    HPI:  Destiny Tucker is a 61 y.o. female who presents today for an annual wellness visit.   1) Health Maintenance -   Diet - Averaging about 2-3 meals per day with snacks consisting of a regular diet; Endorses a good amount of carbohydrates; 1-2 cups of caffeine daily.   Exercise - Walking around and yard work.   2) Preventative Exams / Immunizations:  Dental - Due for exam  Vision --  Up to date   Health Maintenance  Topic Date Due  . Hepatitis C Screening  1955-11-21  . HIV Screening  01/10/1970  . PAP SMEAR  01/11/1976  . TETANUS/TDAP  11/20/2016  . INFLUENZA VACCINE  06/20/2017  . HEMOGLOBIN A1C  09/13/2017  . OPHTHALMOLOGY EXAM  10/11/2017  . URINE MICROALBUMIN  03/14/2018  . FOOT EXAM  03/27/2018  . MAMMOGRAM  10/02/2018  . PNEUMOCOCCAL POLYSACCHARIDE VACCINE (2) 10/03/2018  . COLONOSCOPY  10/11/2022    Immunization History  Administered Date(s) Administered  . Influenza Whole 08/20/2009  . Influenza,inj,Quad PF,36+ Mos 09/15/2015, 10/05/2016  . Influenza-Unspecified 09/20/2013  . PPD Test 05/28/2015  . Pneumococcal Polysaccharide-23 10/03/2013  . Td 11/20/2006  . Tdap 05/21/2017     Allergies  Allergen Reactions  . Codeine      Outpatient Medications Prior to Visit  Medication Sig Dispense Refill  . Alcohol Swabs (B-D SINGLE USE SWABS REGULAR) PADS USE AS DIRECTED TWICE DAILY 100 each 9  . clonazePAM (KLONOPIN) 1 MG tablet TAKE ONE TABLET BY MOUTH TWICE DAILY AS NEEDED FOR ANXIETY 180 tablet 0  . Diclofenac Sodium (PENNSAID) 2 % SOLN Place 1 application onto the skin 2 (two) times daily as needed. 112 g 1  . diphenhydrAMINE (BENADRYL) 25 MG tablet Take 25 mg by mouth every 6 (six) hours as needed.    . fluconazole (DIFLUCAN) 150 MG tablet TAKE ONE TABLET BY MOUTH AS A ONE-TIME  DOSE 1 tablet 1  . HUMULIN R U-500 KWIKPEN 500 UNIT/ML kwikpen INJECT 45 UNITS INTO THE SKIN IN THE MORNING AND 55 UNITS IN THE EVENING 6 mL 7  . hydrochlorothiazide (HYDRODIURIL) 12.5 MG tablet Take 1 tablet (12.5 mg total) by mouth daily. 90 tablet 5  . Ibuprofen-Famotidine 800-26.6 MG TABS Take 1 tablet by mouth 3 (three) times daily as needed. 90 tablet 1  . Insulin Pen Needle (NOVOFINE) 32G X 6 MM MISC Use to inject 2 times per day 300 each 2  . Insulin Syringe-Needle U-100 (B-D INSULIN SYRINGE) 31G X 5/16" 0.3 ML MISC Use 2 syringes daily with Levemir Insulin 100 each 3  . INVOKANA 100 MG TABS tablet TAKE ONE TABLET BY MOUTH ONCE DAILY BEFORE BREAKFAST 90 tablet 1  . lovastatin (MEVACOR) 20 MG tablet TAKE ONE TABLET BY MOUTH ONCE DAILY AT BEDTIME 90 tablet 1  . metFORMIN (GLUCOPHAGE) 1000 MG tablet TAKE ONE TABLET BY MOUTH TWICE DAILY WITH FOOD (Patient taking differently: TAKE ONE TABLET BY MOUTH DAILY WITH FOOD) 180 tablet 2  . omega-3 fish oil (MAXEPA) 1000 MG CAPS capsule Take by mouth.    . sertraline (ZOLOFT) 100 MG tablet TAKE ONE AND ONE-HALF TABLETS BY MOUTH ONCE DAILY 45 tablet 1  . sertraline (ZOLOFT) 100 MG tablet TAKE ONE AND ONE-HALF TABLETS BY MOUTH ONCE DAILY  45 tablet 1  . TURMERIC PO Take by mouth.     No facility-administered medications prior to visit.      Past Medical History:  Diagnosis Date  . ALLERGIC RHINITIS   . ANXIETY   . DEPRESSION   . Diabetes mellitus ([place])   . HYPERLIPIDEMIA   . HYPERTENSION   . Internal hemorrhoids without mention of complication 53/66/4403   Colonoscopy--Dr. Fuller Plan , pts. states hemorrhoids are uncomfortable  . OSTEOPENIA   . TOBACCO USE, QUIT      Past Surgical History:  Procedure Laterality Date  . ANUS SURGERY    . MOHS SURGERY     Precancerous skin lesion  . Removal of cyst & other ovary  09/2004  . Removal of fallopion tube & ovary  1986  . Unilateral Salpingo-Oophorectomy       Family History  Problem Relation  Age of Onset  . Hypertension Mother   . Hyperlipidemia Mother   . Nephrolithiasis Mother   . Diabetes Mother   . Hypertension Father   . Hyperlipidemia Father   . Nephrolithiasis Father   . Diabetes Maternal Uncle      Social History   Social History  . Marital status: Married    Spouse name: N/A  . Number of children: N/A  . Years of education: N/A   Occupational History  . Retired [employer]   Social History Main Topics  . Smoking status: Former Smoker    Quit date: 02/18/2010  . Smokeless tobacco: Former Systems developer     Comment: Married, lives with spouse. 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 Alcohol use Yes     Comment: 2x per year  . Drug use: No  . Sexual activity: Not on file   Other Topics Concern  . Not on file   Social History Narrative  . No narrative on file      Review of Systems  Constitutional: Denies fever, chills, fatigue, or significant weight gain/loss. HENT: Head: Denies headache or neck pain Ears: Denies changes in hearing, ringing in ears, earache, drainage Nose: Denies discharge, stuffiness, itching, nosebleed, sinus pain Throat: Denies sore throat, hoarseness, dry mouth, sores, thrush Eyes: Denies loss/changes in vision, pain, redness, blurry/double vision, flashing lights Cardiovascular: Denies chest pain/discomfort, tightness, palpitations, shortness of breath with activity, difficulty lying down, swelling, sudden awakening with shortness of breath Respiratory: Denies shortness of breath, cough, sputum production, wheezing Gastrointestinal: Denies dysphasia, heartburn, change in appetite, nausea, change in bowel habits, rectal bleeding, constipation, diarrhea, yellow skin or eyes Genitourinary: Denies frequency, urgency, burning/pain, blood in urine, incontinence, change in urinary strength. Musculoskeletal: Denies muscle/joint pain, stiffness, back pain, redness or swelling of joints, trauma Skin: Denies rashes,  lumps, itching, dryness, color changes, or hair/nail changes Neurological: Denies dizziness, fainting, seizures, weakness, numbness, tingling, tremor Psychiatric - Denies nervousness, stress, depression or memory loss Endocrine: Denies heat or cold intolerance, sweating, frequent urination, excessive thirst, changes in appetite Hematologic: Denies ease of bruising or bleeding     Objective:     BP 132/86 (BP Location: Left Arm, Patient Position: Sitting, Cuff Size: Large)   Pulse 83   Temp 97.9 F (36.6 C) (Oral)   Resp 16   Ht 5\' 4"  (1.626 m)   Wt 241 lb (109.3 kg)   SpO2 97%   BMI 41.37 kg/m  Nursing note and vital signs reviewed.  Physical Exam  Constitutional: She is oriented to person, place, and time. She appears well-developed and well-nourished.  HENT:  Head: Normocephalic.  Right Ear: Hearing, tympanic membrane, external ear and ear canal normal.  Left Ear: Hearing, tympanic membrane, external ear and ear canal normal.  Nose: Nose normal.  Mouth/Throat: Uvula is midline, oropharynx is clear and moist and mucous membranes are normal.  Eyes: Conjunctivae and EOM are normal. Pupils are equal, round, and reactive to light.  Neck: Neck supple. No JVD present. No tracheal deviation present. No thyromegaly present.  Cardiovascular: Normal rate, regular rhythm, normal heart sounds and intact distal pulses.   Pulmonary/Chest: Effort normal and breath sounds normal.  Abdominal: Soft. Bowel sounds are normal. She exhibits no distension and no mass. There is no tenderness. There is no rebound and no guarding.  Musculoskeletal: Normal range of motion. She exhibits no edema or tenderness.  Lymphadenopathy:    She has no cervical adenopathy.  Neurological: She is alert and oriented to person, place, and time. She has normal reflexes. No cranial nerve deficit. She exhibits normal muscle tone. Coordination normal.  Skin: Skin is warm and dry.  Psychiatric: She has a normal mood and  affect. Her behavior is normal. Judgment and thought content normal.       Assessment & Plan:   Problem List Items Addressed This Visit      Other   Obesity    Recommend aggressive weight loss resulting in multiple comorbid conditions. Previously was able to lose approximately 7 pounds but has since regained the weight. Encouraged a nutritional intake that is moderate, balance, and varied. Aim for 1200-1500 cal daily higher in protein and low in saturated/processed sugary foods and saturated fats. Consider Weight Watchers, Rickard Patience, or Nutrisystem. If unsuccessful may require bariatric intervention..      Routine adult health maintenance - Primary    1) Anticipatory Guidance: Discussed importance of wearing a seatbelt while driving and not texting while driving; changing batteries in smoke detector at least once annually; wearing suntan lotion when outside; eating a balanced and moderate diet; getting physical activity at least 30 minutes per day.  2) Immunizations / Screenings / Labs:  Tetanus updated today. All other immunizations are up-to-date per recommendations. Due for a dental exam encouraged to be completed independently. Due for cervical cancer screening with recommended follow-up with gynecology. Obtain hepatitis C antibody for hepatitis C screening. Breast and colon cancer screenings are up-to-date. All other screenings are up-to-date per recommendations. Obtain CBC, CMET, and lipid profile.    Overall well exam with risk factors for cardiovascular disease including diabetes, hypertension, hyperlipidemia, and obesity. Recommend aggressive weight loss of 5-10% of current body weight through nutrition and physical activity. Consume a nutritional intake that is moderate, balance, and varied. Continue other healthy lifestyle behaviors and choices. Follow-up prevention exam in 1 year. Follow-up office visit pending blood work and for chronic conditions.       Relevant Orders   CBC     Comprehensive metabolic panel   Lipid panel    Other Visit Diagnoses    Other problems related to lifestyle       Relevant Orders   Hepatitis C antibody   Need for diphtheria-tetanus-pertussis (Tdap) vaccine       Relevant Orders   Tdap vaccine greater than or equal to 7yo IM (Completed)       I am having Ms. Kievit maintain her B-D SINGLE USE SWABS REGULAR, TURMERIC PO, omega-3 fish oil, diphenhydrAMINE, Diclofenac Sodium, Ibuprofen-Famotidine, Insulin Syringe-Needle U-100, Insulin Pen Needle, metFORMIN, hydrochlorothiazide, sertraline, clonazePAM, lovastatin, fluconazole, sertraline, INVOKANA, and HUMULIN R U-500  KWIKPEN.   Follow-up: Return in about 3 months (around 08/21/2017), or if symptoms worsen or fail to improve.   Mauricio Po, FNP

## 2017-06-21 ENCOUNTER — Other Ambulatory Visit: Payer: BLUE CROSS/BLUE SHIELD

## 2017-06-21 DIAGNOSIS — Z7289 Other problems related to lifestyle: Secondary | ICD-10-CM

## 2017-06-22 ENCOUNTER — Encounter: Payer: Self-pay | Admitting: Family

## 2017-06-22 LAB — HEPATITIS C ANTIBODY: HCV AB: NONREACTIVE

## 2017-06-26 ENCOUNTER — Other Ambulatory Visit: Payer: Self-pay

## 2017-06-26 ENCOUNTER — Telehealth: Payer: Self-pay | Admitting: Endocrinology

## 2017-06-26 MED ORDER — FLUCONAZOLE 150 MG PO TABS: ORAL_TABLET | ORAL | 1 refills | Status: DC

## 2017-06-26 NOTE — Telephone Encounter (Signed)
This has been ordered 

## 2017-06-26 NOTE — Telephone Encounter (Signed)
**  Remind patient they can make refill requests via MyChart**  Medication refill request (Name & Dosage):  fluconazole (DIFLUCAN) 150 MG tablet  Preferred pharmacy (Name & Address): [facility], [place] [phone] (Phone) [phone] (Fax)       Other comments (if applicable):

## 2017-07-11 ENCOUNTER — Other Ambulatory Visit: Payer: Self-pay | Admitting: Family

## 2017-07-11 NOTE — Telephone Encounter (Signed)
Rx faxed

## 2017-07-11 NOTE — Telephone Encounter (Signed)
Last refill was 03/20/17 per [facility]

## 2017-07-22 ENCOUNTER — Other Ambulatory Visit: Payer: Self-pay | Admitting: Family

## 2017-07-22 DIAGNOSIS — F329 Major depressive disorder, single episode, unspecified: Secondary | ICD-10-CM

## 2017-07-22 DIAGNOSIS — F32A Depression, unspecified: Secondary | ICD-10-CM

## 2017-08-23 ENCOUNTER — Telehealth: Payer: Self-pay

## 2017-08-23 NOTE — Telephone Encounter (Signed)
Patient called and stated that she received another bill for the Hep C antibody.   She was informed that it had been resolved by Caren Griffins in the lab.    Can you help with this please.

## 2017-09-05 ENCOUNTER — Ambulatory Visit (INDEPENDENT_AMBULATORY_CARE_PROVIDER_SITE_OTHER): Payer: BLUE CROSS/BLUE SHIELD | Admitting: Nurse Practitioner

## 2017-09-05 ENCOUNTER — Encounter: Payer: Self-pay | Admitting: Nurse Practitioner

## 2017-09-05 VITALS — BP 126/82 | HR 84 | Temp 98.8°F | Resp 16 | Ht 64.0 in | Wt 240.0 lb

## 2017-09-05 DIAGNOSIS — M26609 Unspecified temporomandibular joint disorder, unspecified side: Secondary | ICD-10-CM

## 2017-09-05 DIAGNOSIS — Z23 Encounter for immunization: Secondary | ICD-10-CM | POA: Diagnosis not present

## 2017-09-05 MED ORDER — CYCLOBENZAPRINE HCL 5 MG PO TABS: 5.0000 mg | ORAL_TABLET | Freq: Every day | ORAL | 0 refills | Status: DC

## 2017-09-05 MED ORDER — NAPROXEN 500 MG PO TABS: 500.0000 mg | ORAL_TABLET | Freq: Two times a day (BID) | ORAL | 0 refills | Status: DC

## 2017-09-05 NOTE — Patient Instructions (Addendum)
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  I've sent you prescription for naproxen, you may take this twice daily, and a prescription for a muscle relaxer, Flexeril, to take at bedtime. The flexeril can cause drowsiness. Don't drink alcohol, or operate machinery when you take this medicine. Don't combine benadryl and flexeril.  Let me know if your symptoms aren't improved next week.  It was nice to meet you. Thanks for letting me take care of you today :)   Temporomandibular Joint Syndrome Temporomandibular joint (TMJ) syndrome is a condition that affects the joints between your jaw and your skull. The TMJs are located near your ears and allow your jaw to open and close. These joints and the nearby muscles are involved in all movements of the jaw. People with TMJ syndrome have pain in the area of these joints and muscles. Chewing, biting, or other movements of the jaw can be difficult or painful. TMJ syndrome can be caused by various things. In many cases, the condition is mild and goes away within a few weeks. For some people, the condition can become a long-term problem. What are the causes? Possible causes of TMJ syndrome include:  Grinding your teeth or clenching your jaw. Some people do this when they are under stress.  Arthritis.  Injury to the jaw.  Head or neck injury.  Teeth or dentures that are not aligned well.  In some cases, the cause of TMJ syndrome may not be known. What are the signs or symptoms? The most common symptom is an aching pain on the side of the head in the area of the TMJ. Other symptoms may include:  Pain when moving your jaw, such as when chewing or biting.  Being unable to open your jaw all the way.  Making a clicking sound when you open your mouth.  Headache.  Earache.  Neck or shoulder pain.  How is this diagnosed? Diagnosis can usually be made based on your symptoms, your medical history, and a physical exam. Your health care  provider may check the range of motion of your jaw. Imaging tests, such as X-rays or an MRI, are sometimes done. You may need to see your dentist to determine if your teeth and jaw are lined up correctly. How is this treated? TMJ syndrome often goes away on its own. If treatment is needed, the options may include:  Eating soft foods and applying ice or heat.  Medicines to relieve pain or inflammation.  Medicines to relax the muscles.  A splint, bite plate, or mouthpiece to prevent teeth grinding or jaw clenching.  Relaxation techniques or counseling to help reduce stress.  Transcutaneous electrical nerve stimulation (TENS). This helps to relieve pain by applying an electrical current through the skin.  Acupuncture. This is sometimes helpful to relieve pain.  Jaw surgery. This is rarely needed.  Follow these instructions at home:  Take medicines only as directed by your health care provider.  Eat a soft diet if you are having trouble chewing.  Apply ice to the painful area. ? Put ice in a plastic bag. ? Place a towel between your skin and the bag. ? Leave the ice on for 20 minutes, 2-3 times a day.  Apply a warm compress to the painful area as directed.  Massage your jaw area and perform any jaw stretching exercises as recommended by your health care provider.  If you were given a mouthpiece or bite plate, wear it as directed.  Avoid foods  that require a lot of chewing. Do not chew gum.  Keep all follow-up visits as directed by your health care provider. This is important. Contact a health care provider if:  You are having trouble eating.  You have new or worsening symptoms. Get help right away if:  Your jaw locks open or closed. This information is not intended to replace advice given to you by your health care provider. Make sure you discuss any questions you have with your health care provider. Document Released: 08/01/2001 Document Revised: 07/06/2016 Document  Reviewed: 06/11/2014 Elsevier Interactive Patient Education  2018 [employer]    Jaw Range of Motion Exercises Jaw range of motion exercises are exercises that help your jaw to move better. These exercises can help to prevent:  Difficulty opening your mouth.  Pain in your jaw while it is both open and closed.  What should I be careful of when doing jaw exercises? Make sure that you only do jaw exercises as directed by your health care provider. You should only move your jaw as far as it can go in each direction, if told to do so by your health care provider. Do not move your jaw into positions that cause you any pain. What exercises should I do?  Stick your jaw forward. Hold this position for 1-2 seconds. Allow your jaw to return to its normal position and rest it there for 1-2 seconds. Do this exercise 8 times.  Stand or sit in front of a mirror. Place your tongue on the roof of your mouth, just behind your top teeth. Slowly open and close your jaw, keeping your tongue on the roof of your mouth. While you open and close your mouth, try to keep your jaw from moving toward one side or the other. Repeat this 8 times.  Move your jaw right. Hold this position for 1-2 seconds. Allow your jaw to return to its normal position, and rest it there for 1-2 seconds. Do this exercise 8 times.  Move your jaw left. Hold this position for 1-2 seconds. Allow your jaw to return to its normal position, and rest it there for 1-2 seconds. Do this exercise 8 times.  Open your mouth as far as it is can comfortably go. Hold this position for 1-2 seconds. Then close your mouth and rest for 1-2 seconds. Do this exercise 8 times.  Move your jaw in a circular motion, starting toward the right (clockwise). Repeat this 8 times.  Move your jaw in a circular motion, starting toward the left (counterclockwise). Repeat this 8 times. Apply moist heat packs or ice packs to your jaw before or after performing your  exercises as directed by your health care provider. What else can I do? Avoid the following, if they cause jaw pain or they increase your jaw pain:  Chewing gum.  Clenching your jaw or teeth or keeping tension in your jaw muscles.  Leaning on your jaw, such as resting your jaw in your hand while leaning on a desk.  This information is not intended to replace advice given to you by your health care provider. Make sure you discuss any questions you have with your health care provider. Document Released: 10/19/2008 Document Revised: 04/13/2016 Document Reviewed: 10/07/2014 Elsevier Interactive Patient Education  [employer]. .

## 2017-09-05 NOTE — Progress Notes (Signed)
Subjective:    Patient ID: Destiny Tucker, female    DOB: 08-21-1956, 61 y.o.   MRN: [id number]  HPI Ms. Destiny Tucker is a 61 yo female who presents today to establish care. She is transferring to me from another provider in the same clinic. She c/o right ear pain. The pain began about 1.5 weeks ago after swimming in a pool. The pain is intermittent throughout the day.The pain sometimes radiates into her right jaw. She's been taking advil once daily with temporary relief of the pain. She also takes a benadryl every night, which has not improved her symptoms. She reports sinus pressure, ear itching, nasal congestion, jaw popping. She denies fevers, ear drainage, sore throat, cough, chest pain, shortness of breath. Shes had tmj in the past.  Review of Systems See HPI  Past Medical History:  Diagnosis Date  . ALLERGIC RHINITIS   . ANXIETY   . DEPRESSION   . Diabetes mellitus ([place])   . HYPERLIPIDEMIA   . HYPERTENSION   . Internal hemorrhoids without mention of complication 87/86/7672   Colonoscopy--Dr. Fuller Plan , pts. states hemorrhoids are uncomfortable  . OSTEOPENIA   . TOBACCO USE, QUIT      Social History   Social History  . Marital status: Married    Spouse name: N/A  . Number of children: N/A  . Years of education: N/A   Occupational History  . Retired [employer]   Social History Main Topics  . Smoking status: Former Smoker    Quit date: 02/18/2010  . Smokeless tobacco: Former Systems developer     Comment: Married, lives with spouse. 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 Alcohol use Yes     Comment: 2x per year  . Drug use: No  . Sexual activity: Not on file   Other Topics Concern  . Not on file   Social History Narrative  . No narrative on file    Past Surgical History:  Procedure Laterality Date  . ANUS SURGERY    . MOHS SURGERY     Precancerous skin lesion  . Removal of cyst & other ovary  09/2004  . Removal of fallopion tube & ovary  1986    . Unilateral Salpingo-Oophorectomy      Family History  Problem Relation Age of Onset  . Hypertension Mother   . Hyperlipidemia Mother   . Nephrolithiasis Mother   . Diabetes Mother   . Hypertension Father   . Hyperlipidemia Father   . Nephrolithiasis Father   . Diabetes Maternal Uncle     Allergies  Allergen Reactions  . Codeine     Current Outpatient Prescriptions on File Prior to Visit  Medication Sig Dispense Refill  . Alcohol Swabs (B-D SINGLE USE SWABS REGULAR) PADS USE AS DIRECTED TWICE DAILY 100 each 9  . clonazePAM (KLONOPIN) 1 MG tablet TAKE 1 TABLET BY MOUTH TWICE DAILY AS NEEDED FOR ANXIETY 180 tablet 0  . diphenhydrAMINE (BENADRYL) 25 MG tablet Take 25 mg by mouth every 6 (six) hours as needed.    . fluconazole (DIFLUCAN) 150 MG tablet TAKE ONE TABLET BY MOUTH AS A ONE-TIME DOSE 1 tablet 1  . HUMULIN R U-500 KWIKPEN 500 UNIT/ML kwikpen INJECT 45 UNITS INTO THE SKIN IN THE MORNING AND 55 UNITS IN THE EVENING 6 mL 7  . hydrochlorothiazide (HYDRODIURIL) 12.5 MG tablet Take 1 tablet (12.5 mg total) by mouth daily. 90 tablet 5  . Ibuprofen-Famotidine 800-26.6 MG TABS Take 1 tablet by mouth  3 (three) times daily as needed. 90 tablet 1  . Insulin Pen Needle (NOVOFINE) 32G X 6 MM MISC Use to inject 2 times per day 300 each 2  . Insulin Syringe-Needle U-100 (B-D INSULIN SYRINGE) 31G X 5/16" 0.3 ML MISC Use 2 syringes daily with Levemir Insulin 100 each 3  . INVOKANA 100 MG TABS tablet TAKE ONE TABLET BY MOUTH ONCE DAILY BEFORE BREAKFAST 90 tablet 1  . lovastatin (MEVACOR) 20 MG tablet TAKE ONE TABLET BY MOUTH ONCE DAILY AT BEDTIME 90 tablet 1  . metFORMIN (GLUCOPHAGE) 1000 MG tablet TAKE ONE TABLET BY MOUTH TWICE DAILY WITH FOOD (Patient taking differently: TAKE ONE TABLET BY MOUTH DAILY WITH FOOD) 180 tablet 2  . sertraline (ZOLOFT) 100 MG tablet TAKE ONE AND ONE-HALF TABLETS BY MOUTH ONCE DAILY 45 tablet 1  . sertraline (ZOLOFT) 100 MG tablet TAKE 1 & 1/2 (ONE & ONE-HALF)  TABLETS BY MOUTH ONCE DAILY 45 tablet 1   No current facility-administered medications on file prior to visit.     BP 126/82 (BP Location: Left Arm, Patient Position: Sitting, Cuff Size: Large)   Pulse 84   Temp 98.8 F (37.1 C) (Oral)   Resp 16   Ht 5\' 4"  (1.626 m)   Wt 240 lb (108.9 kg)   SpO2 97%   BMI 41.20 kg/m      Objective:   Physical Exam  Constitutional: She is oriented to person, place, and time. She appears well-developed and well-nourished. No distress.  HENT:  Head: Normocephalic and atraumatic.  Right Ear: Hearing and ear canal normal. There is tenderness. No drainage. No mastoid tenderness.  Left Ear: Hearing, tympanic membrane, external ear and ear canal normal. No drainage. No mastoid tenderness.  Nose: Nose normal.  Mouth/Throat: Oropharynx is clear and moist and mucous membranes are normal.  Right TM cloudy.  Cardiovascular: Normal rate, regular rhythm, normal heart sounds and intact distal pulses.   Pulmonary/Chest: Effort normal and breath sounds normal.  Neurological: She is alert and oriented to person, place, and time.  Skin: Skin is warm and dry. No rash noted. No erythema.  Psychiatric: She has a normal mood and affect. Judgment and thought content normal.       Assessment & Plan:  TMJ- No fevers, ear drainage or erythema, signs of ear infection. 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 Exercises provided on AVS. Pt advised to discontinue advil while taking naproxen and not to combine flexeril and benadryl. We discussed return precautions.  Flu shot given in clinic today.

## 2017-09-06 DIAGNOSIS — Z23 Encounter for immunization: Secondary | ICD-10-CM | POA: Diagnosis not present

## 2017-09-06 NOTE — Addendum Note (Signed)
Addended by: Delice Bison E on: 09/06/2017 07:59 AM   Modules accepted: Orders

## 2017-09-07 NOTE — Telephone Encounter (Signed)
Confirmed with coding, [facility] to resubmit Hep C with diagnosis code Z11.59.

## 2017-09-07 NOTE — Telephone Encounter (Signed)
Email to coding to confirm diagnosis code.( z72.89 sent, think it should be z11.59 for [employer]). Spoke with patient today, she is aware we are working on it, although [employer] advised her this is non-covered.

## 2017-09-15 ENCOUNTER — Telehealth: Payer: Self-pay | Admitting: Endocrinology

## 2017-09-18 ENCOUNTER — Other Ambulatory Visit: Payer: Self-pay | Admitting: Endocrinology

## 2017-09-18 NOTE — Telephone Encounter (Signed)
Pt called in about this refill, it was sent to Dr Dwyane Dee in [place]

## 2017-09-19 NOTE — Telephone Encounter (Signed)
Can we find out why the patient would like diflucan?

## 2017-09-19 NOTE — Telephone Encounter (Signed)
LVM for pt to call back in regards.  

## 2017-09-21 ENCOUNTER — Other Ambulatory Visit: Payer: Self-pay | Admitting: *Deleted

## 2017-09-21 MED ORDER — LOVASTATIN 20 MG PO TABS: 20.0000 mg | ORAL_TABLET | Freq: Every day | ORAL | 1 refills | Status: DC

## 2017-09-21 NOTE — Telephone Encounter (Signed)
Pt states that invokana causes her to get yeast infections and it has been going on for a while. She does plan on talking with Dwyane Dee about changing medications due to this problem. Also she has for a longer supply of naproxen bc it does help with her knee pain. Please advise

## 2017-09-21 NOTE — Telephone Encounter (Signed)
I have sent a prescription to her pharmacy for the diflucan. I gave her the naproxen prescription for the facial pain that she was having at her last visit. However, if she found the naproxen helped her knee pain she can purchase it over the counter to take as needed for knee pain. She should not take naproxen on a daily basis for longer than 6 months because of risk of stomach ulcers and bleeding. I'll be glad to send her a prescription for the over the counter dosage if she needs.

## 2017-09-23 ENCOUNTER — Other Ambulatory Visit: Payer: Self-pay | Admitting: Nurse Practitioner

## 2017-09-25 MED ORDER — NAPROXEN 500 MG PO TABS: 500.0000 mg | ORAL_TABLET | Freq: Two times a day (BID) | ORAL | 0 refills | Status: DC

## 2017-09-25 NOTE — Telephone Encounter (Signed)
Pt aware. Per pt she would like to have the naproxen as an rx so insurance will cover it. [facility] is ok with this and it has been sent for a 30 day supply.

## 2017-09-25 NOTE — Addendum Note (Signed)
Addended by: Delice Bison E on: 09/25/2017 12:57 PM   Modules accepted: Orders

## 2017-10-14 ENCOUNTER — Other Ambulatory Visit: Payer: Self-pay | Admitting: Family

## 2017-10-14 DIAGNOSIS — F32A Depression, unspecified: Secondary | ICD-10-CM

## 2017-10-14 DIAGNOSIS — F329 Major depressive disorder, single episode, unspecified: Secondary | ICD-10-CM

## 2017-10-15 ENCOUNTER — Telehealth: Payer: Self-pay | Admitting: Nurse Practitioner

## 2017-10-15 DIAGNOSIS — F329 Major depressive disorder, single episode, unspecified: Secondary | ICD-10-CM

## 2017-10-15 DIAGNOSIS — F32A Depression, unspecified: Secondary | ICD-10-CM

## 2017-10-15 MED ORDER — CLONAZEPAM 1 MG PO TABS: 1.0000 mg | ORAL_TABLET | Freq: Two times a day (BID) | ORAL | 0 refills | Status: DC | PRN

## 2017-10-15 MED ORDER — SERTRALINE HCL 100 MG PO TABS: ORAL_TABLET | ORAL | 1 refills | Status: DC

## 2017-10-15 NOTE — Telephone Encounter (Signed)
Are you ok with filling the clonazepam? Pt takes every day. Last refill was 07/11/17 per [facility] of a 90 day supply. Refill for zoloft has been sent.

## 2017-10-15 NOTE — Telephone Encounter (Signed)
Patient is requesting call back in regard to zoloft and clonazepam. Patient would like to know if [facility] is going to not fill these scripts at all or does she need an OV to prescribe?  Patient would like a voice mail left if she does not answer phone.

## 2017-10-15 NOTE — Telephone Encounter (Signed)
Spoke with pt to let her know response below. Pt understood and will call back to schedule a follow up in regards to the clonazepam.

## 2017-10-17 ENCOUNTER — Other Ambulatory Visit: Payer: Self-pay

## 2017-10-31 ENCOUNTER — Other Ambulatory Visit: Payer: Self-pay | Admitting: Endocrinology

## 2017-11-19 ENCOUNTER — Other Ambulatory Visit: Payer: Self-pay | Admitting: Nurse Practitioner

## 2017-11-22 NOTE — Telephone Encounter (Signed)
I wanted her to come in for discussion about klonopin prior to another refill, I asked her to schedule an appointment for this on her last refill. I am worried about the long term risks of this medication for her. I do not see an upcoming appointment for her. Is she planning to schedule this?

## 2017-11-22 NOTE — Telephone Encounter (Signed)
Last refill was 10/15/17 per [facility]. Please advise on refills.

## 2017-11-23 MED ORDER — CLONAZEPAM 1 MG PO TABS: 1.0000 mg | ORAL_TABLET | Freq: Two times a day (BID) | ORAL | 0 refills | Status: DC | PRN

## 2017-11-23 NOTE — Telephone Encounter (Signed)
Patient has an appointment with you the end of the month. States it is very hard to get in with you sooner. Would like to know if you could send in her klonopin to get her to that appointment and that could be something you can discuss at the visit. Please advise. Last refill was 10/15/17 per database. Please advise.

## 2017-11-23 NOTE — Addendum Note (Signed)
Addended by: Lance Sell on: 11/23/2017 03:19 PM   Modules accepted: Orders

## 2017-11-23 NOTE — Telephone Encounter (Signed)
Pt aware.

## 2017-11-23 NOTE — Telephone Encounter (Signed)
1 month refill sent.

## 2017-12-12 ENCOUNTER — Encounter: Payer: Self-pay | Admitting: Nurse Practitioner

## 2017-12-12 ENCOUNTER — Ambulatory Visit: Payer: BLUE CROSS/BLUE SHIELD | Admitting: Nurse Practitioner

## 2017-12-12 ENCOUNTER — Other Ambulatory Visit (INDEPENDENT_AMBULATORY_CARE_PROVIDER_SITE_OTHER): Payer: BLUE CROSS/BLUE SHIELD

## 2017-12-12 VITALS — BP 148/86 | HR 91 | Temp 98.7°F | Resp 16 | Ht 64.0 in | Wt 244.8 lb

## 2017-12-12 DIAGNOSIS — M25561 Pain in right knee: Secondary | ICD-10-CM

## 2017-12-12 DIAGNOSIS — G8929 Other chronic pain: Secondary | ICD-10-CM

## 2017-12-12 DIAGNOSIS — R42 Dizziness and giddiness: Secondary | ICD-10-CM

## 2017-12-12 DIAGNOSIS — M25562 Pain in left knee: Secondary | ICD-10-CM | POA: Diagnosis not present

## 2017-12-12 DIAGNOSIS — I10 Essential (primary) hypertension: Secondary | ICD-10-CM

## 2017-12-12 DIAGNOSIS — R000 Tachycardia, unspecified: Secondary | ICD-10-CM

## 2017-12-12 DIAGNOSIS — F411 Generalized anxiety disorder: Secondary | ICD-10-CM

## 2017-12-12 LAB — VITAMIN B12: Vitamin B-12: 368 pg/mL (ref 211–911)

## 2017-12-12 LAB — TSH: TSH: 1.63 u[IU]/mL (ref 0.35–4.50)

## 2017-12-12 LAB — COMPREHENSIVE METABOLIC PANEL
ALBUMIN: 3.9 g/dL (ref 3.5–5.2)
ALT: 20 U/L (ref 0–35)
AST: 16 U/L (ref 0–37)
Alkaline Phosphatase: 71 U/L (ref 39–117)
BUN: 17 mg/dL (ref 6–23)
CO2: 22 mEq/L (ref 19–32)
CREATININE: 0.51 mg/dL (ref 0.40–1.20)
Calcium: 9.1 mg/dL (ref 8.4–10.5)
Chloride: 105 mEq/L (ref 96–112)
GFR: 129.49 mL/min (ref >60.00)
GLUCOSE: 130 mg/dL — AB (ref 70–99)
Potassium: 3.8 mEq/L (ref 3.5–5.1)
SODIUM: 140 meq/L (ref 135–145)
Total Bilirubin: 0.3 mg/dL (ref 0.2–1.2)
Total Protein: 7.5 g/dL (ref 6.0–8.3)

## 2017-12-12 LAB — CBC WITH DIFFERENTIAL/PLATELET
Basophils Absolute: 0.1 10*3/uL (ref 0.0–0.1)
Basophils Relative: 0.6 % (ref 0.0–3.0)
EOS ABS: 0.3 10*3/uL (ref 0.0–0.7)
EOS PCT: 3.1 % (ref 0.0–5.0)
HCT: 43.8 % (ref 36.0–46.0)
Hemoglobin: 14.6 g/dL (ref 12.0–15.0)
LYMPHS ABS: 2.2 10*3/uL (ref 0.7–4.0)
Lymphocytes Relative: 21.6 % (ref 12.0–46.0)
MCHC: 33.4 g/dL (ref 30.0–36.0)
MCV: 91.7 fl (ref 78.0–100.0)
MONO ABS: 0.6 10*3/uL (ref 0.1–1.0)
Monocytes Relative: 6.4 % (ref 3.0–12.0)
NEUTROS ABS: 6.9 10*3/uL (ref 1.4–7.7)
NEUTROS PCT: 68.3 % (ref 43.0–77.0)
PLATELETS: 315 10*3/uL (ref 150.0–400.0)
RBC: 4.77 Mil/uL (ref 3.87–5.11)
RDW: 14.1 % (ref 11.5–15.5)
WBC: 10.1 10*3/uL (ref 4.0–10.5)

## 2017-12-12 LAB — MAGNESIUM: MAGNESIUM: 1.9 mg/dL (ref 1.5–2.5)

## 2017-12-12 MED ORDER — NAPROXEN 500 MG PO TABS: 500.0000 mg | ORAL_TABLET | Freq: Two times a day (BID) | ORAL | 0 refills | Status: DC

## 2017-12-12 MED ORDER — CLONAZEPAM 1 MG PO TABS: 1.0000 mg | ORAL_TABLET | Freq: Two times a day (BID) | ORAL | 0 refills | Status: DC | PRN

## 2017-12-12 NOTE — Progress Notes (Signed)
Name: Destiny Tucker   MRN: [id number]    DOB: [birth year]   Date:12/12/2017       Progress Note  Subjective  Chief Complaint  Chief Complaint  Patient presents with  . Follow-up    klonpopin and wants to talk about balance    HPI Ms Piechocki presents today to discuss klonopin refill and also complains of feeling off balance.  Anxiety-she has been maintained on zoloft 100 daily and  Klonopin 1 mg BID. She says she actually only takes the klonopin once daily at bedtime to help sleep and occasionally during the day if she is very anxious. She has been on zoloft for many years and is not really sure if it helps her mood anymore. 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 She feels like the klonopin helps her sleep through the night which helps her handle her day better  Disequilibrium- She says this has been occurring for one year She says she "just feels off balance" and is not sure if this is related to her chronic knee pain or if there is something else going on.  She says she does not feel dizzy but just feels that something "is off" while she is walking. She has also noticed that sometimes her heart feels like it is racing when she wakes in the morning. She does not feel weak, she has not fallen. She denies syncope, confusion, headache, vision changes, speech difficulty, chest pain, shortness of breath, nausea, vomiting She has tried to work out and swim to improve her balance. She is currently in clincal trial for diabetes and HLD.  Hypertension -maintained on HCTZ 12.5 daily. Reports daily medication compliance without adverse medication effects. Denies headaches, vision changes, chest pain, shortness of breath, edema.  BP Readings from Last 3 Encounters:  12/12/17 (!) 148/86  09/05/17 126/82  05/21/17 132/86    Patient Active Problem List   Diagnosis Date Noted  . Routine adult  health maintenance 05/21/2017  . Obesity 12/22/2016  . Degenerative arthritis of knee 10/25/2016  . Right knee pain 05/09/2016  . Left hip pain 03/16/2016  . Left knee pain 03/16/2016  . Anterior knee pain 01/28/2016  . Trochanteric bursitis of right hip 01/28/2016  . Acute sinusitis 09/28/2015  . Carpal tunnel syndrome 09/28/2015  . Pre-employment health screening examination 05/26/2015  . Facial swelling 04/28/2015  . Muscle spasms of neck 03/09/2015  . Contusion of right hip 03/09/2015  . Bilateral lower extremity edema 03/09/2015  . Degenerative arthritis of left knee 01/26/2015  . 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 Bladder problem 08/02/2014  . Type II or unspecified type diabetes mellitus without mention of complication, uncontrolled 08/30/2010  . HYPERLIPIDEMIA 05/18/2010  . Depression 05/18/2010  . Essential hypertension 05/18/2010  . Allergic rhinitis 05/18/2010  . TOBACCO USE, QUIT 05/18/2010  . HYSTEROSCOPY, HX OF 05/18/2010  . Anxiety state 10/26/2008  . HEMORRHOIDS, INTERNAL 10/26/2008  . OSTEOPENIA 10/26/2008    Past Surgical History:  Procedure Laterality Date  . ANUS SURGERY    . MOHS SURGERY     Precancerous skin lesion  . Removal of cyst & other ovary  09/2004  . Removal of fallopion tube & ovary  1986  . Unilateral Salpingo-Oophorectomy      Family History  Problem Relation Age of Onset  . Hypertension Mother   . Hyperlipidemia Mother   . Nephrolithiasis Mother   .  Diabetes Mother   . Hypertension Father   . Hyperlipidemia Father   . Nephrolithiasis Father   . Diabetes Maternal Uncle     Social History   Socioeconomic History  . Marital status: Married    Spouse name: Not on file  . Number of children: Not on file  . Years of education: Not on file  . Highest education level: Not on file  Social Needs  . Financial resource strain: Not on file  . Food insecurity - worry: Not on file  . Food insecurity - inability: Not on  file  . Transportation needs - medical: Not on file  . Transportation needs - non-medical: Not on file  Occupational History  . Occupation: Retired    Fish farm manager: Doctor, [facility]  Tobacco Use  . Smoking status: Former Smoker    Last attempt to quit: 02/18/2010    Years since quitting: 7.8  . Smokeless tobacco: Former Systems developer  . Tobacco comment: Married, lives with spouse. 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 Alcohol use: Yes    Comment: 2x per year  . Drug use: No  . Sexual activity: Not on file  Other Topics Concern  . Not on file  Social History Narrative  . Not on file     Current Outpatient Medications:  .  Alcohol Swabs (B-D SINGLE USE SWABS REGULAR) PADS, USE AS DIRECTED TWICE DAILY, Disp: 100 each, Rfl: 9 .  clonazePAM (KLONOPIN) 1 MG tablet, Take 1 tablet (1 mg total) by mouth 2 (two) times daily as needed. for anxiety, Disp: 60 tablet, Rfl: 0 .  cyclobenzaprine (FLEXERIL) 5 MG tablet, Take 1 tablet (5 mg total) by mouth at bedtime., Disp: 14 tablet, Rfl: 0 .  diphenhydrAMINE (BENADRYL) 25 MG tablet, Take 25 mg by mouth every 6 (six) hours as needed., Disp: , Rfl:  .  fluconazole (DIFLUCAN) 150 MG tablet, TAKE ONE TABLET BY MOUTH AS A ONE-TIME DOSE, Disp: 1 tablet, Rfl: 1 .  fluconazole (DIFLUCAN) 150 MG tablet, TAKE ONE TABLET BY MOUTH AS A ONE-TIME DOSE, Disp: 1 tablet, Rfl: 0 .  HUMULIN R U-500 KWIKPEN 500 UNIT/ML kwikpen, INJECT 45 UNITS INTO THE SKIN IN THE MORNING AND 55 UNITS IN THE EVENING, Disp: 6 mL, Rfl: 7 .  hydrochlorothiazide (HYDRODIURIL) 12.5 MG tablet, Take 1 tablet (12.5 mg total) by mouth daily., Disp: 90 tablet, Rfl: 5 .  Ibuprofen-Famotidine 800-26.6 MG TABS, Take 1 tablet by mouth 3 (three) times daily as needed., Disp: 90 tablet, Rfl: 1 .  Insulin Pen Needle (NOVOFINE) 32G X 6 MM MISC, Use to inject 2 times per day, Disp: 300 each, Rfl: 2 .  Insulin Pen Needle 31G X 8 MM MISC, by Does not apply  route., Disp: , Rfl:  .  Insulin Syringe-Needle U-100 (B-D INSULIN SYRINGE) 31G X 5/16" 0.3 ML MISC, Use 2 syringes daily with Levemir Insulin, Disp: 100 each, Rfl: 3 .  INVOKANA 100 MG TABS tablet, TAKE 1 TABLET BY MOUTH ONCE DAILY BEFORE BREAKFAST, Disp: 90 tablet, Rfl: 1 .  lovastatin (MEVACOR) 20 MG tablet, Take 1 tablet (20 mg total) by mouth daily with breakfast., Disp: 90 tablet, Rfl: 1 .  metFORMIN (GLUCOPHAGE) 1000 MG tablet, TAKE ONE TABLET BY MOUTH TWICE DAILY WITH FOOD (Patient taking differently: TAKE ONE TABLET BY MOUTH DAILY WITH FOOD), Disp: 180 tablet, Rfl: 2 .  naproxen (NAPROSYN) 500 MG tablet, Take 1 tablet (500 mg total) by mouth 2 (two) times  daily with a meal., Disp: 60 tablet, Rfl: 0 .  sertraline (ZOLOFT) 100 MG tablet, TAKE 1 & 1/2 (ONE & ONE-HALF) TABLETS BY MOUTH ONCE DAILY, Disp: 45 tablet, Rfl: 1  Allergies  Allergen Reactions  . Codeine      ROS See HPI  Objective  Vitals:   12/12/17 1110 12/12/17 2029  BP: (!) 150/94 (!) 148/86  Pulse: 91   Resp: 16   Temp: 98.7 F (37.1 C)   TempSrc: Oral   SpO2: 97%   Weight: 244 lb 12.8 oz (111 kg)   Height: 5\' 4"  (1.626 m)     Body mass index is 42.02 kg/m.  Physical Exam Constitutional: Patient appears well-developed and well-nourished. No distress.  HENT: Head: Normocephalic and atraumatic. Ears: Bilateral TMs without erythema or effusion; Nose: Nose normal. Mouth/Throat: Oropharynx is clear and moist. No oropharyngeal exudate.  Eyes: Conjunctivae and EOM are normal. Pupils are equal, round, and reactive to light. No scleral icterus.  Neck: Normal range of motion. Neck supple.  Cardiovascular: Normal rate, regular rhythm and normal heart sounds. No BLE edema. Distal pulses intact. Pulmonary/Chest: Effort normal and breath sounds normal. No respiratory distress. Musculoskeletal: Normal range of motion. No gross deformities Neurological: She is alert and oriented to person, place, and time. No cranial  nerve deficit. Coordination, balance, strength, speech and gait are normal.  Skin: Skin is warm and dry. No rash noted.  Psychiatric: Patient has a normal mood and affect. behavior is normal. Judgment and thought content normal.  PHQ2/9: Depression screen [facility] 2/9 09/06/2017 05/21/2017  Decreased Interest 1 0  Down, Depressed, Hopeless 1 0  PHQ - 2 Score 2 0  Altered sleeping 0 -  Tired, decreased energy 1 -  Change in appetite 0 -  Feeling bad or failure about yourself  2 -  Trouble concentrating 0 -  Moving slowly or fidgety/restless 2 -  Suicidal thoughts 0 -  PHQ-9 Score 7 -    Fall Risk: Fall Risk  05/21/2017  Falls in the past year? No   Assessment & Plan RTC in about 2 weeks for BP recheck and to follow up on disequilibrium, racing heart and diagnostic testing ordered today  Racing heart beat - EKG 12-Lead- I reviewed the patients EKG today-sinus rhythym without acute abnormalities - CBC with Differential/Platelet; Future - Comprehensive metabolic panel; Future - TSH; Future -RTC in about 2 weeks- we may need to consider cardiology referral if racing heart continues  Disequilibrium - CBC with Differential/Platelet; Future - Comprehensive metabolic panel; Future - TSH; Future - B12; Future - Magnesium; Future - CT Head Wo Contrast; Future -EKG 12-Lead- I reviewed the patients EKG today-sinus rhythym without acute abnormalities We discussed stroke precaution and instructions were given to call 911 right away for any signs of a stroke-See AVS for education provided to patient Will consider referrals to neurology, ENT pending lab work. She was instructed to inform clinical trial staff of her symptoms.  Chronic pain of both knees - naproxen (NAPROSYN) 500 MG tablet; Take 1 tablet (500 mg total) by mouth 2 (two) times daily with a meal.  Dispense: 60 tablet; Refill: 0

## 2017-12-12 NOTE — Patient Instructions (Addendum)
Please head downstairs for lab work.  I have placed an order for a CT scan. Our office will call you to schedule this appointment.  Please try to check your blood pressure once daily or at least a few times a week, at the same time each day, and keep a log .Please return in about 2 weeks, we can go through your test results, blood pressure log and decide what needs to be done next.  We are going to keep your klonopin and zoloft as is for now.  PLEASE INFORM CLINICAL TRIAL STAFF ABOUT FEELING OFF BALANCE  It was good to see you. Thanks for letting me take care of you today :)   Stroke Prevention Some health problems and behaviors may make it more likely for you to have a stroke. Below are ways to lessen your risk of having a stroke.  Be active for at least 30 minutes on most or all days.  Do not smoke. Try not to be around others who smoke.  Do not drink too much alcohol. ? Do not have more than 2 drinks a day if you are a man. ? Do not have more than 1 drink a day if you are a woman and are not pregnant.  Eat healthy foods, such as fruits and vegetables. If you were put on a specific diet, follow the diet as told.  Keep your cholesterol levels under control through diet and medicines. Look for foods that are low in saturated fat, trans fat, cholesterol, and are high in fiber.  If you have diabetes, follow all diet plans and take your medicine as told.  Ask your doctor if you need treatment to lower your blood pressure. If you have high blood pressure (hypertension), follow all diet plans and take your medicine as told by your doctor.  If you are 33-73 years old, have your blood pressure checked every 3-5 years. If you are age 29 or older, have your blood pressure checked every year.  Keep a healthy weight. Eat foods that are low in calories, salt, saturated fat, trans fat, and cholesterol.  Do not take drugs.  Avoid birth control pills, if this applies. Talk to your doctor about  the risks of taking birth control pills.  Talk to your doctor if you have sleep problems (sleep apnea).  Take all medicine as told by your doctor. ? You may be told to take aspirin or blood thinner medicine. Take this medicine as told by your doctor. ? Understand your medicine instructions.  Make sure any other conditions you have are being taken care of.  Get help right away if:  You suddenly lose feeling (you feel numb) or have weakness in your face, arm, or leg.  Your face or eyelid hangs down to one side.  You suddenly feel confused.  You have trouble talking (aphasia) or understanding what people are saying.  You suddenly have trouble seeing in one or both eyes.  You suddenly have trouble walking.  You are dizzy.  You lose your balance or your movements are clumsy (uncoordinated).  You suddenly have a very bad headache and you do not know the cause.  You have new chest pain.  Your heart feels like it is fluttering or skipping a beat (irregular heartbeat). Do not wait to see if the symptoms above go away. Get help right away. Call your local emergency services (911 in [place] Do not drive yourself to the hospital. This information is not intended to replace  advice given to you by your health care provider. Make sure you discuss any questions you have with your health care provider. Document Released: 05/07/2012 Document Revised: 04/13/2016 Document Reviewed: 05/09/2013 Elsevier Interactive Patient Education  2018 [place] After being diagnosed with an anxiety disorder, you may be relieved to know why you have felt or behaved a certain way. It is natural to also feel overwhelmed about the treatment ahead and what it will mean for your life. With care and support, you can manage this condition and recover from it. How to cope with anxiety Dealing with stress Stress is your body's reaction to life changes and events, both good and bad. Stress can  last just a few hours or it can be ongoing. Stress can play a major role in anxiety, so it is important to learn both how to cope with stress and how to think about it differently. Talk with your health care provider or a counselor to learn more about stress reduction. He or she may suggest some stress reduction techniques, such as:  Music therapy. This can include creating or listening to music that you enjoy and that inspires you.  Mindfulness-based meditation. This involves being aware of your normal breaths, rather than trying to control your breathing. It can be done while sitting or walking.  Centering prayer. This is a kind of meditation that involves focusing on a word, phrase, or sacred image that is meaningful to you and that brings you peace.  Deep breathing. To do this, expand your stomach and inhale slowly through your nose. Hold your breath for 3-5 seconds. Then exhale slowly, allowing your stomach muscles to relax.  Self-talk. This is a skill where you identify thought patterns that lead to anxiety reactions and correct those thoughts.  Muscle relaxation. This involves tensing muscles then relaxing them.  Choose a stress reduction technique that fits your lifestyle and personality. Stress reduction techniques take time and practice. Set aside 5-15 minutes a day to do them. Therapists can offer training in these techniques. The training may be covered by some insurance plans. Other things you can do to manage stress include:  Keeping a stress diary. This can help you learn what triggers your stress and ways to control your response.  Thinking about how you respond to certain situations. You may not be able to control everything, but you can control your reaction.  Making time for activities that help you relax, and not feeling guilty about spending your time in this way.  Therapy combined with coping and stress-reduction skills provides the best chance for successful  treatment. Medicines Medicines can help ease symptoms. Medicines for anxiety include:  Anti-anxiety drugs.  Antidepressants.  Beta-blockers.  Medicines may be used as the main treatment for anxiety disorder, along with therapy, or if other treatments are not working. Medicines should be prescribed by a health care provider. Relationships Relationships can play a big part in helping you recover. Try to spend more time connecting with trusted friends and family members. Consider going to couples counseling, taking family education classes, or going to family therapy. Therapy can help you and others better understand the condition. How to recognize changes in your condition Everyone has a different response to treatment for anxiety. Recovery from anxiety happens when symptoms decrease and stop interfering with your daily activities at home or work. This may mean that you will start to:  Have better concentration and focus.  Sleep better.  Be less irritable.  Have more energy.  Have improved memory.  It is important to recognize when your condition is getting worse. Contact your health care provider if your symptoms interfere with home or work and you do not feel like your condition is improving. Where to find help and support: You can get help and support from these sources:  Self-help groups.  Online and [employer].  A trusted spiritual leader.  Couples counseling.  Family education classes.  Family therapy.  Follow these instructions at home:  Eat a healthy diet that includes plenty of vegetables, fruits, whole grains, low-fat dairy products, and lean protein. Do not eat a lot of foods that are high in solid fats, added sugars, or salt.  Exercise. Most adults should do the following: ? Exercise for at least 150 minutes each week. The exercise should increase your heart rate and make you sweat (moderate-intensity exercise). ? Strengthening exercises at least  twice a week.  Cut down on caffeine, tobacco, alcohol, and other potentially harmful substances.  Get the right amount and quality of sleep. Most adults need 7-9 hours of sleep each night.  Make choices that simplify your life.  Take over-the-counter and prescription medicines only as told by your health care provider.  Avoid caffeine, alcohol, and certain over-the-counter cold medicines. These may make you feel worse. Ask your pharmacist which medicines to avoid.  Keep all follow-up visits as told by your health care provider. This is important. Questions to ask your health care provider  Would I benefit from therapy?  How often should I follow up with a health care provider?  How long do I need to take medicine?  Are there any long-term side effects of my medicine?  Are there any alternatives to taking medicine? Contact a health care provider if:  You have a hard time staying focused or finishing daily tasks.  You spend many hours a day feeling worried about everyday life.  You become exhausted by worry.  You start to have headaches, feel tense, or have nausea.  You urinate more than normal.  You have diarrhea. Get help right away if:  You have a racing heart and shortness of breath.  You have thoughts of hurting yourself or others. If you ever feel like you may hurt yourself or others, or have thoughts about taking your own life, get help right away. You can go to your nearest emergency department or call:  Your local emergency services (911 in the [place]  A suicide crisis helpline, such as the [facility] at 806-648-7159. This is open 24-hours a day.  Summary  Taking steps to deal with stress can help calm you.  Medicines cannot cure anxiety disorders, but they can help ease symptoms.  Family, friends, and partners can play a big part in helping you recover from an anxiety disorder. This information is not intended to replace  advice given to you by your health care provider. Make sure you discuss any questions you have with your health care provider. Document Released: 10/31/2016 Document Revised: 10/31/2016 Document Reviewed: 10/31/2016 Elsevier Interactive Patient Education  [employer].

## 2017-12-12 NOTE — Assessment & Plan Note (Signed)
Her BP is slightly elevated today despite reported medication compliance. She will RTC in 2 weeks with BP log and cuff and we will determine if we need to adjust BP medications.

## 2017-12-12 NOTE — Assessment & Plan Note (Signed)
Stable. We discussed non-pharmacological methods to reduce stress and anxiety and education provided to patient in AVS. We will continue medications as she has currently been taking- zoloft 100 daily and klonopin 1mg  at bedtime and prn for acute anxiety as this seems to work best for her.  she will let me know if symptoms worsen or if she feels like she is needing to take more klonopin so that we can look for additional alternatives to manage her mood. - clonazePAM (KLONOPIN) 1 MG tablet; Take 1 tablet (1 mg total) by mouth 2 (two) times daily as needed. for anxiety  Dispense: 60 tablet; Refill: 0 Controlled substance registry reviewed with no irregularities.

## 2017-12-20 ENCOUNTER — Ambulatory Visit (INDEPENDENT_AMBULATORY_CARE_PROVIDER_SITE_OTHER)
Admission: RE | Admit: 2017-12-20 | Discharge: 2017-12-20 | Disposition: A | Discharge status: Home / Self Care | Payer: BLUE CROSS/BLUE SHIELD | Source: Ambulatory Visit | Attending: Nurse Practitioner | Admitting: Nurse Practitioner

## 2017-12-20 DIAGNOSIS — R42 Dizziness and giddiness: Secondary | ICD-10-CM

## 2017-12-21 ENCOUNTER — Telehealth: Payer: Self-pay | Admitting: Nurse Practitioner

## 2017-12-21 NOTE — Telephone Encounter (Signed)
Copied from [place]. Topic: Inquiry >> Dec 21, 2017 11:18 AM Conception Chancy, NT wrote: Patient is calling to get her CT results from 12/20/17. Please contact patient.

## 2017-12-21 NOTE — Telephone Encounter (Signed)
Please advise 

## 2017-12-24 ENCOUNTER — Other Ambulatory Visit: Payer: Self-pay | Admitting: Nurse Practitioner

## 2017-12-24 DIAGNOSIS — R000 Tachycardia, unspecified: Secondary | ICD-10-CM

## 2017-12-24 DIAGNOSIS — R42 Dizziness and giddiness: Secondary | ICD-10-CM

## 2017-12-24 NOTE — Telephone Encounter (Signed)
Pt aware of recent CT results.

## 2017-12-24 NOTE — Telephone Encounter (Signed)
Copied from [place]. Topic: Inquiry >> Dec 21, 2017 11:18 AM Destiny Tucker, NT wrote: Patient is calling to get her CT results from 12/20/17. Please contact patient. >> Dec 24, 2017  2:14 PM Boyd Kerbs wrote: She is calling again for CT results. She is getting worried.

## 2017-12-31 ENCOUNTER — Ambulatory Visit: Payer: BLUE CROSS/BLUE SHIELD | Admitting: Nurse Practitioner

## 2017-12-31 ENCOUNTER — Encounter: Payer: Self-pay | Admitting: Nurse Practitioner

## 2017-12-31 VITALS — BP 146/84 | HR 81 | Temp 98.2°F | Resp 16 | Ht 64.0 in | Wt 241.0 lb

## 2017-12-31 DIAGNOSIS — R000 Tachycardia, unspecified: Secondary | ICD-10-CM | POA: Diagnosis not present

## 2017-12-31 DIAGNOSIS — I10 Essential (primary) hypertension: Secondary | ICD-10-CM

## 2017-12-31 MED ORDER — LOSARTAN POTASSIUM 50 MG PO TABS: 50.0000 mg | ORAL_TABLET | Freq: Every day | ORAL | 1 refills | Status: DC

## 2017-12-31 NOTE — Patient Instructions (Addendum)
Please start losartan 50mg  once daily for your blood pressure. Please continue HCTZ 12.5 as you have been taking. Please try to check your blood pressure once daily or at least a few times a week, at the same time each day, and keep a log.  Please return in about 2 weeks for follow up of your blood pressure.  Please call [phone] [facility] cardiology, to schedule a cardiology appointment. Let me know if you have any trouble scheduling an appointment.  It was good to see you. Thanks for letting me take care of you today :)   Mediterranean Diet A Mediterranean diet refers to food and lifestyle choices that are based on the traditions of countries located on the [employer]. This way of eating has been shown to help prevent certain conditions and improve outcomes for people who have chronic diseases, like kidney disease and heart disease. What are tips for following this plan? Lifestyle  Cook and eat meals together with your family, when possible.  Drink enough fluid to keep your urine clear or pale yellow.  Be physically active every day. This includes: ? Aerobic exercise like running or swimming. ? Leisure activities like gardening, walking, or housework.  Get 7-8 hours of sleep each night.  If recommended by your health care provider, drink red wine in moderation. This means 1 glass a day for nonpregnant women and 2 glasses a day for men. A glass of wine equals 5 oz (150 mL). Reading food labels  Check the serving size of packaged foods. For foods such as rice and pasta, the serving size refers to the amount of cooked product, not dry.  Check the total fat in packaged foods. Avoid foods that have saturated fat or trans fats.  Check the ingredients list for added sugars, such as corn syrup. Shopping  At the grocery store, buy most of your food from the areas near the walls of the store. This includes: ? Fresh fruits and vegetables (produce). ? Grains, beans, nuts, and seeds.  Some of these may be available in unpackaged forms or large amounts (in bulk). ? Fresh seafood. ? Poultry and eggs. ? Low-fat dairy products.  Buy whole ingredients instead of prepackaged foods.  Buy fresh fruits and vegetables in-season from local farmers markets.  Buy frozen fruits and vegetables in resealable bags.  If you do not have access to quality fresh seafood, buy precooked frozen shrimp or canned fish, such as tuna, salmon, or sardines.  Buy small amounts of raw or cooked vegetables, salads, or olives from the deli or salad bar at your store.  Stock your pantry so you always have certain foods on hand, such as olive oil, canned tuna, canned tomatoes, rice, pasta, and beans. Cooking  Cook foods with extra-virgin olive oil instead of using butter or other vegetable oils.  Have meat as a side dish, and have vegetables or grains as your main dish. This means having meat in small portions or adding small amounts of meat to foods like pasta or stew.  Use beans or vegetables instead of meat in common dishes like chili or lasagna.  Experiment with different cooking methods. Try roasting or broiling vegetables instead of steaming or sauteing them.  Add frozen vegetables to soups, stews, pasta, or rice.  Add nuts or seeds for added healthy fat at each meal. You can add these to yogurt, salads, or vegetable dishes.  Marinate fish or vegetables using olive oil, lemon juice, garlic, and fresh herbs. Meal planning  Plan to eat 1 vegetarian meal one day each week. Try to work up to 2 vegetarian meals, if possible.  Eat seafood 2 or more times a week.  Have healthy snacks readily available, such as: ? Vegetable sticks with hummus. ? [place] yogurt. ? Fruit and nut trail mix.  Eat balanced meals throughout the week. This includes: ? Fruit: 2-3 servings a day ? Vegetables: 4-5 servings a day ? Low-fat dairy: 2 servings a day ? Fish, poultry, or lean meat: 1 serving a  day ? Beans and legumes: 2 or more servings a week ? Nuts and seeds: 1-2 servings a day ? Whole grains: 6-8 servings a day ? Extra-virgin olive oil: 3-4 servings a day  Limit red meat and sweets to only a few servings a month What are my food choices?  Mediterranean diet ? Recommended ? Grains: Whole-grain pasta. Brown rice. Bulgar wheat. Polenta. Couscous. Whole-wheat bread. [place]. ? Vegetables: Artichokes. Beets. Broccoli. Cabbage. Carrots. Eggplant. Green beans. Chard. Kale. Spinach. Onions. Leeks. Peas. Squash. Tomatoes. Peppers. Radishes. ? Fruits: Apples. Apricots. Avocado. Berries. Bananas. Cherries. Dates. Figs. Grapes. Lemons. Melon. Oranges. Peaches. Plums. Pomegranate. ? Meats and other protein foods: Beans. Almonds. Sunflower seeds. Pine nuts. Peanuts. [place]. Salmon. Scallops. Shrimp. [place]. Tilapia. Clams. Oysters. Eggs. ? Dairy: Low-fat milk. Cheese. Greek yogurt. ? Beverages: Water. Red wine. Herbal tea. ? Fats and oils: Extra virgin olive oil. Avocado oil. Grape seed oil. ? Sweets and desserts: [place] yogurt with honey. Baked apples. Poached pears. Trail mix. ? Seasoning and other foods: Basil. Cilantro. Coriander. Cumin. Mint. Parsley. Sage. Rosemary. Tarragon. Garlic. Oregano. Thyme. Pepper. Balsalmic vinegar. Tahini. Hummus. Tomato sauce. Olives. Mushrooms. ? Limit these ? Grains: Prepackaged pasta or rice dishes. Prepackaged cereal with added sugar. ? Vegetables: Deep fried potatoes (french fries). ? Fruits: Fruit canned in syrup. ? Meats and other protein foods: Beef. Pork. Lamb. Poultry with skin. Hot dogs. [place] Salines. ? Dairy: Ice cream. Sour cream. Whole milk. ? Beverages: Juice. Sugar-sweetened soft drinks. Beer. Liquor and spirits. ? Fats and oils: Butter. Canola oil. Vegetable oil. Beef fat (tallow). Lard. ? Sweets and desserts: Cookies. Cakes. Pies. Candy. ? Seasoning and other foods: Mayonnaise. Premade sauces and marinades. ? The items listed may not be a  complete list. Talk with your dietitian about what dietary choices are right for you. Summary  The Mediterranean diet includes both food and lifestyle choices.  Eat a variety of fresh fruits and vegetables, beans, nuts, seeds, and whole grains.  Limit the amount of red meat and sweets that you eat.  Talk with your health care provider about whether it is safe for you to drink red wine in moderation. This means 1 glass a day for nonpregnant women and 2 glasses a day for men. A glass of wine equals 5 oz (150 mL). This information is not intended to replace advice given to you by your health care provider. Make sure you discuss any questions you have with your health care provider. Document Released: 06/29/2016 Document Revised: 08/01/2016 Document Reviewed: 06/29/2016 Elsevier Interactive Patient Education  [employer].

## 2017-12-31 NOTE — Progress Notes (Signed)
Name: Destiny Tucker   MRN: [id number]    DOB: Mar 12, 1956   Date:12/31/2017       Progress Note  Subjective  Chief Complaint  Chief Complaint  Patient presents with  . Follow-up    CT scan, wants to stay on klonopin    HPI  Destiny Tucker is coming in today for a blood pressure follow up. We first met on 1/23 when she came to establish care with me and was complaining of feeling dizzy, off balance and feeling that her heart was racing. A CBC, CMET, TSH, B12, magnesium, and EKG on 1/23 were normal. A ct scan on 1/31 showed no acute abnormality, mild chronic small vessel ischemic disease. Referrals were placed to neurology and cardiology. She initially declined the referral to cardiology but says she will go today as she has continued to experience the feeling that her heart is racing occasionally- usually early in the morning when she wakes from sleep.  Hypertension -maintained on HCTZ 12.5 daily After her last visit, I recommended that she increase her HCTZ from 12.5 to 25  daily to improve BP control but she told me that her endocrinologist had decreased her HCTZ dosage to 12.5 daily due to invokana therapy and that her readings were normal at home. Reports she takes the HCTZ 12.5 daily without missing a dose. She denies headaches, vision changes, chest pain, shortness of breath, edema. She has been actively working on increasing her exercise and tells me today that she is going to stop adding salt to her food  BP Readings from Last 3 Encounters:  12/31/17 (!) 146/84  12/12/17 (!) 148/86  09/05/17 126/82     Patient Active Problem List   Diagnosis Date Noted  . Routine adult health maintenance 05/21/2017  . Obesity 12/22/2016  . Degenerative arthritis of knee 10/25/2016  . Right knee pain 05/09/2016  . Left hip pain 03/16/2016  . Left knee pain 03/16/2016  . Anterior knee pain 01/28/2016  . Trochanteric bursitis of right hip 01/28/2016  . Acute sinusitis 09/28/2015  . Carpal tunnel  syndrome 09/28/2015  . Pre-employment health screening examination 05/26/2015  . Facial swelling 04/28/2015  . Muscle spasms of neck 03/09/2015  . Contusion of right hip 03/09/2015  . Bilateral lower extremity edema 03/09/2015  . Degenerative arthritis of left knee 01/26/2015  . 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 Bladder problem 08/02/2014  . Type II or unspecified type diabetes mellitus without mention of complication, uncontrolled 08/30/2010  . HYPERLIPIDEMIA 05/18/2010  . Depression 05/18/2010  . Essential hypertension 05/18/2010  . Allergic rhinitis 05/18/2010  . TOBACCO USE, QUIT 05/18/2010  . HYSTEROSCOPY, HX OF 05/18/2010  . Anxiety state 10/26/2008  . HEMORRHOIDS, INTERNAL 10/26/2008  . OSTEOPENIA 10/26/2008    Past Surgical History:  Procedure Laterality Date  . ANUS SURGERY    . MOHS SURGERY     Precancerous skin lesion  . Removal of cyst & other ovary  09/2004  . Removal of fallopion tube & ovary  1986  . Unilateral Salpingo-Oophorectomy      Family History  Problem Relation Age of Onset  . Hypertension Mother   . Hyperlipidemia Mother   . Nephrolithiasis Mother   . Diabetes Mother   . Hypertension Father   . Hyperlipidemia Father   . Nephrolithiasis Father   . Diabetes Maternal Uncle     Social History   Socioeconomic History  . Marital status: Married    Spouse name: Not on file  .  Number of children: Not on file  . Years of education: Not on file  . Highest education level: Not on file  Social Needs  . Financial resource strain: Not on file  . Food insecurity - worry: Not on file  . Food insecurity - inability: Not on file  . Transportation needs - medical: Not on file  . Transportation needs - non-medical: Not on file  Occupational History  . Occupation: Retired    Fish farm manager: Doctor, [employer]  Tobacco Use  . Smoking status: Former Smoker    Last attempt to quit: 02/18/2010    Years since quitting: 7.8  . Smokeless  tobacco: Former Systems developer  . Tobacco comment: Married, lives with spouse. 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 Alcohol use: Yes    Comment: 2x per year  . Drug use: No  . Sexual activity: Not on file  Other Topics Concern  . Not on file  Social History Narrative  . Not on file     Current Outpatient Medications:  .  Alcohol Swabs (B-D SINGLE USE SWABS REGULAR) PADS, USE AS DIRECTED TWICE DAILY, Disp: 100 each, Rfl: 9 .  clonazePAM (KLONOPIN) 1 MG tablet, Take 1 tablet (1 mg total) by mouth 2 (two) times daily as needed. for anxiety, Disp: 60 tablet, Rfl: 0 .  cyclobenzaprine (FLEXERIL) 5 MG tablet, Take 1 tablet (5 mg total) by mouth at bedtime., Disp: 14 tablet, Rfl: 0 .  diphenhydrAMINE (BENADRYL) 25 MG tablet, Take 25 mg by mouth every 6 (six) hours as needed., Disp: , Rfl:  .  fluconazole (DIFLUCAN) 150 MG tablet, TAKE ONE TABLET BY MOUTH AS A ONE-TIME DOSE, Disp: 1 tablet, Rfl: 1 .  fluconazole (DIFLUCAN) 150 MG tablet, TAKE ONE TABLET BY MOUTH AS A ONE-TIME DOSE, Disp: 1 tablet, Rfl: 0 .  HUMULIN R U-500 KWIKPEN 500 UNIT/ML kwikpen, INJECT 45 UNITS INTO THE SKIN IN THE MORNING AND 55 UNITS IN THE EVENING, Disp: 6 mL, Rfl: 7 .  hydrochlorothiazide (HYDRODIURIL) 12.5 MG tablet, Take 1 tablet (12.5 mg total) by mouth daily., Disp: 90 tablet, Rfl: 5 .  Ibuprofen-Famotidine 800-26.6 MG TABS, Take 1 tablet by mouth 3 (three) times daily as needed., Disp: 90 tablet, Rfl: 1 .  Insulin Pen Needle (NOVOFINE) 32G X 6 MM MISC, Use to inject 2 times per day, Disp: 300 each, Rfl: 2 .  Insulin Pen Needle 31G X 8 MM MISC, by Does not apply route., Disp: , Rfl:  .  Insulin Syringe-Needle U-100 (B-D INSULIN SYRINGE) 31G X 5/16" 0.3 ML MISC, Use 2 syringes daily with Levemir Insulin, Disp: 100 each, Rfl: 3 .  INVOKANA 100 MG TABS tablet, TAKE 1 TABLET BY MOUTH ONCE DAILY BEFORE BREAKFAST, Disp: 90 tablet, Rfl: 1 .  lovastatin (MEVACOR) 20 MG tablet, Take 1  tablet (20 mg total) by mouth daily with breakfast., Disp: 90 tablet, Rfl: 1 .  metFORMIN (GLUCOPHAGE) 1000 MG tablet, TAKE ONE TABLET BY MOUTH TWICE DAILY WITH FOOD (Patient taking differently: TAKE ONE TABLET BY MOUTH DAILY WITH FOOD), Disp: 180 tablet, Rfl: 2 .  naproxen (NAPROSYN) 500 MG tablet, Take 1 tablet (500 mg total) by mouth 2 (two) times daily with a meal., Disp: 60 tablet, Rfl: 0 .  sertraline (ZOLOFT) 100 MG tablet, TAKE 1 & 1/2 (ONE & ONE-HALF) TABLETS BY MOUTH ONCE DAILY, Disp: 45 tablet, Rfl: 1  Allergies  Allergen Reactions  . Codeine      ROS See  HPI  Objective  Vitals:   12/31/17 1605  BP: (!) 146/84  Pulse: 81  Resp: 16  Temp: 98.2 F (36.8 C)  TempSrc: Oral  SpO2: 97%  Weight: 241 lb (109.3 kg)  Height: '5\' 4"'$  (1.626 m)    Body mass index is 41.37 kg/m.  Physical Exam  Vital signs reviewed Constitutional: Patient appears well-developed and well-nourished. No distress.  HENT: Head: Normocephalic and atraumatic. Nose: Nose normal. Mouth/Throat: Oropharynx is clear and moist. No oropharyngeal exudate.  Eyes: Conjunctivae and EOM are normal. No scleral icterus.  Neck: Normal range of motion. Neck supple.  Cardiovascular: Normal rate, regular rhythm and normal heart sounds. No BLE edema. Distal pulses intact. Pulmonary/Chest: Effort normal and breath sounds normal. No respiratory distress. Musculoskeletal: Normal range of motion. No gross deformities Neurological: She is alert and oriented to person, place, and time.  Coordination, balance, strength, speech and gait are normal.  Skin: Skin is warm and dry. No rash noted.  Psychiatric: Patient has a normal mood and affect. Behavior is normal. Judgment and thought content normal.    Assessment & Plan RTC in 2 weeks for F/u of losartan, BMET  Tachycardia Referral to cardiology has been made. She is calling to schedule appt today.

## 2017-12-31 NOTE — Assessment & Plan Note (Addendum)
BP remains elevated on HCTZ 12.5 and she should not increase dosage per endocrinology She says BP is normal at home but can not provide readings We discussed BP goal of 140/90 and adding a second medication today to reach this goal. she is agreeable to start losartan, a good choice as she is diabetic. We also Discussed the role of healthy diet and reduction of salt in the management of hypertension - losartan (COZAAR) 50 MG tablet; Take 1 tablet (50 mg total) by mouth daily.  Dispense: 30 tablet; Refill: 1 She will RTC in about 2 weeks for follow up of losartan

## 2018-01-01 ENCOUNTER — Telehealth: Payer: Self-pay | Admitting: Nurse Practitioner

## 2018-01-01 NOTE — Telephone Encounter (Signed)
Please advise 

## 2018-01-01 NOTE — Telephone Encounter (Signed)
Copied from [place] [id number]. Topic: Quick Communication - See Telephone Encounter >> Jan 01, 2018  2:05 PM Hewitt Shorts wrote: CRM for notification. See Telephone encounter for: pt is was seem yesterday by shambley and the patient is wanting to know if she needs to stay on the lovastaton ok to leave a message on voicemail   Best number [phone]  01/01/18.

## 2018-01-01 NOTE — Telephone Encounter (Signed)
Yes she should continue lovastatin for her high cholesterol

## 2018-01-02 NOTE — Telephone Encounter (Signed)
Spoke with patient, she has been informed of the note below. Patient understood.

## 2018-01-03 ENCOUNTER — Ambulatory Visit: Payer: BLUE CROSS/BLUE SHIELD | Admitting: Cardiovascular Disease

## 2018-01-07 ENCOUNTER — Encounter: Payer: Self-pay | Admitting: Neurology

## 2018-01-08 ENCOUNTER — Other Ambulatory Visit: Payer: Self-pay | Admitting: Nurse Practitioner

## 2018-01-17 ENCOUNTER — Other Ambulatory Visit: Payer: Self-pay | Admitting: Nurse Practitioner

## 2018-01-17 DIAGNOSIS — F329 Major depressive disorder, single episode, unspecified: Secondary | ICD-10-CM

## 2018-01-17 DIAGNOSIS — F32A Depression, unspecified: Secondary | ICD-10-CM

## 2018-01-18 ENCOUNTER — Telehealth: Payer: Self-pay | Admitting: Nurse Practitioner

## 2018-01-18 NOTE — Telephone Encounter (Signed)
Copied from [place] [id number]. Topic: Quick Communication - See Telephone Encounter >> Jan 18, 2018  3:48 PM Aurelio Brash B wrote: CRM for notification. See Telephone encounter for:  Pt  states her BP today was 158/90 and 146/88   and she wants to know if Gayla Medicus wants to change her BP med,  she states she is nervous, she also states she has an apt with Cardiologist next week. She says it is ok to leave voicemail.

## 2018-01-20 NOTE — Telephone Encounter (Signed)
Lets see if she can come in for OV or at least a nurse visit this week to calibrate her blood pressure monitor- would like to make sure her home monitor is accurate prior to making any medication adjustments. She was actually supposed to come back for a 2 week follow up from her 2/11 visit so we could see how she was doing on the new blood pressure medications and update labs

## 2018-01-21 NOTE — Telephone Encounter (Signed)
LVM with the response below.

## 2018-01-23 NOTE — Progress Notes (Signed)
Cardiology Office Note   Date:  01/24/2018   ID:  ANTOINE Tucker, DOB January 29, 1956, MRN [id number]  PCP:  Lance Sell, NP  Cardiologist:   Jenkins Rouge, MD   No chief complaint on file.     History of Present Illness: Destiny Tucker is a 62 y.o. female who presents for consultation regarding tachycardia. Referred by Caesar Chestnut NP Reviewed her office note from 01/10/18 She complained of heart racing , dizzy feeling and being off balance. She had normal Labs including TSH. CT head no pathology Feeling of heart racing occurs mostly in am when she awakens CRF;s include DM-2 HLD and HTN.  HCTZ dose decreased by endocrine due to Invokana She has been on ARB for BP as well  She has a lot of somatic complaints related to her obesity and DM. Tries to work out at  [employer] but ? BS gets low and she has little stamina Been told she has an enlarged heart On xray.     Past Medical History:  Diagnosis Date  . ALLERGIC RHINITIS   . ANXIETY   . DEPRESSION   . Diabetes mellitus ([place])   . Genital herpes   . HYPERLIPIDEMIA   . HYPERTENSION   . Internal hemorrhoids without mention of complication 41/93/7902   Colonoscopy--Dr. Fuller Plan , pts. states hemorrhoids are uncomfortable  . OSTEOPENIA   . TOBACCO USE, QUIT     Past Surgical History:  Procedure Laterality Date  . ANUS SURGERY    . MOHS SURGERY     Precancerous skin lesion  . Removal of cyst & other ovary  09/2004  . Removal of fallopion tube & ovary  1986  . Unilateral Salpingo-Oophorectomy       Current Outpatient Medications  Medication Sig Dispense Refill  . Alcohol Swabs (B-D SINGLE USE SWABS REGULAR) PADS USE AS DIRECTED TWICE DAILY 100 each 9  . clonazePAM (KLONOPIN) 1 MG tablet TAKE 1 TABLET BY MOUTH TWICE DAILY AS NEEDED FOR ANXIETY 60 tablet 0  . cyclobenzaprine (FLEXERIL) 5 MG tablet Take 1 tablet (5 mg total) by mouth at bedtime. 14 tablet 0  . diphenhydrAMINE (BENADRYL) 25 MG tablet Take 25 mg by mouth  every 6 (six) hours as needed.    [name] HUMULIN R U-500 KWIKPEN 500 UNIT/ML kwikpen INJECT 45 UNITS INTO THE SKIN IN THE MORNING AND 55 UNITS IN THE EVENING 6 mL 7  . hydrochlorothiazide (HYDRODIURIL) 12.5 MG tablet Take 1 tablet (12.5 mg total) by mouth daily. 90 tablet 5  . Insulin Pen Needle (NOVOFINE) 32G X 6 MM MISC Use to inject 2 times per day 300 each 2  . Insulin Pen Needle 31G X 8 MM MISC by Does not apply route.    . Insulin Syringe-Needle U-100 (B-D INSULIN SYRINGE) 31G X 5/16" 0.3 ML MISC Use 2 syringes daily with Levemir Insulin 100 each 3  . INVOKANA 100 MG TABS tablet TAKE 1 TABLET BY MOUTH ONCE DAILY BEFORE BREAKFAST 90 tablet 1  . losartan (COZAAR) 50 MG tablet Take 1 tablet (50 mg total) by mouth daily. 30 tablet 1  . lovastatin (MEVACOR) 20 MG tablet Take 1 tablet (20 mg total) by mouth daily with breakfast. 90 tablet 1  . metFORMIN (GLUCOPHAGE) 1000 MG tablet TAKE ONE TABLET BY MOUTH TWICE DAILY WITH FOOD (Patient taking differently: TAKE ONE TABLET BY MOUTH DAILY WITH FOOD) 180 tablet 2  . naproxen (NAPROSYN) 500 MG tablet Take 1 tablet (500 mg total) by mouth  2 (two) times daily with a meal. 60 tablet 0  . sertraline (ZOLOFT) 100 MG tablet TAKE 1 & 1/2 (ONE & ONE-HALF) TABLETS BY MOUTH ONCE DAILY 45 tablet 1   No current facility-administered medications for this visit.     Allergies:   Codeine    Social History:  The patient  reports that she quit smoking about 7 years ago. She has quit using smokeless tobacco. She reports that she drinks alcohol. She reports that she does not use drugs.   Family History:  The patient's family history includes Diabetes in her maternal uncle and mother; Hyperlipidemia in her father and mother; Hypertension in her father and mother; Nephrolithiasis in her father and mother.    ROS:  Please see the history of present illness.   Otherwise, review of systems are positive for none.   All other systems are reviewed and negative.    PHYSICAL  EXAM: VS:  BP 128/74   Pulse 76   Ht 5' 4.5" (1.638 m)   Wt 240 lb (108.9 kg)   BMI 40.56 kg/m  , BMI Body mass index is 40.56 kg/m. Affect appropriate Obese diabetic female  HEENT: normal Neck supple with no adenopathy JVP normal no bruits no thyromegaly Lungs clear with no wheezing and good diaphragmatic motion Heart:  S1/S2 no murmur, no rub, gallop or click PMI normal Abdomen: benighn, BS positve, no tenderness, no AAA no bruit.  No HSM or HJR Distal pulses intact with no bruits No edema Neuro non-focal Skin warm and dry No muscular weakness    EKG:  NSR rate 91 normal no pre excitation 12/12/17   Recent Labs: 12/12/2017: ALT 20; BUN 17; Creatinine, Ser 0.51; Hemoglobin 14.6; Magnesium 1.9; Platelets 315.0; Potassium 3.8; Sodium 140; TSH 1.63    Lipid Panel    Component Value Date/Time   CHOL 146 03/14/2017 1456   TRIG 205.0 (H) 03/14/2017 1456   TRIG 212 03/16/2010   HDL 39.00 (L) 03/14/2017 1456   CHOLHDL 4 03/14/2017 1456   VLDL 41.0 (H) 03/14/2017 1456   LDLCALC 59 01/10/2016 1532   LDLDIRECT 83.0 03/14/2017 1456      Wt Readings from Last 3 Encounters:  01/24/18 240 lb (108.9 kg)  12/31/17 241 lb (109.3 kg)  12/12/17 244 lb 12.8 oz (111 kg)      Other studies Reviewed: Additional studies/ records that were reviewed today include: Primary care notes, labs ECG .    ASSESSMENT AND PLAN:  1. Palpitations benign infrequent no need for monitor  2. HTN:  Well controlled.  Continue current medications and low sodium Dash type diet.   3. HLD:  Continue statin labs with primary 4. DM:  Discussed low carb diet.  Target hemoglobin A1c is 6.5 or less.  Continue current medications.  5. Dyspnea: r/o anginal equivalent exercise myovue with history of cardiomegaly will check Echo for RV and LV size and function     Current medicines are reviewed at length with the patient today.  The patient does not have concerns regarding medicines.  The following  changes have been made:  None   Labs/ tests ordered today include: Echo and Ex Myovue   Orders Placed This Encounter  Procedures  . MYOCARDIAL PERFUSION IMAGING  . ECHOCARDIOGRAM COMPLETE     Disposition:   FU with cardiology PRN      Signed, Jenkins Rouge, MD  01/24/2018 4:26 PM    [place], [place]  [address] Phone: [phone]; Fax: [phone]

## 2018-01-24 ENCOUNTER — Encounter: Payer: Self-pay | Admitting: Cardiovascular Disease

## 2018-01-24 ENCOUNTER — Ambulatory Visit: Payer: BLUE CROSS/BLUE SHIELD | Admitting: Cardiovascular Disease

## 2018-01-24 VITALS — BP 128/74 | HR 76 | Ht 64.5 in | Wt 240.0 lb

## 2018-01-24 DIAGNOSIS — R002 Palpitations: Secondary | ICD-10-CM | POA: Diagnosis not present

## 2018-01-24 DIAGNOSIS — R0600 Dyspnea, unspecified: Secondary | ICD-10-CM

## 2018-01-24 DIAGNOSIS — I517 Cardiomegaly: Secondary | ICD-10-CM | POA: Diagnosis not present

## 2018-01-24 DIAGNOSIS — I10 Essential (primary) hypertension: Secondary | ICD-10-CM

## 2018-01-24 DIAGNOSIS — E785 Hyperlipidemia, unspecified: Secondary | ICD-10-CM

## 2018-01-24 NOTE — Patient Instructions (Addendum)
Medication Instructions:  Your physician recommends that you continue on your current medications as directed. Please refer to the Current Medication list given to you today.  Labwork: NONE  Testing/Procedures: Your physician has requested that you have en exercise stress myoview. For further information please visit [email or web address]. Please follow instruction sheet, as given.  Your physician has requested that you have an echocardiogram. Echocardiography is a painless test that uses sound waves to create images of your heart. It provides your doctor with information about the size and shape of your heart and how well your heart's chambers and valves are working. This procedure takes approximately one hour. There are no restrictions for this procedure.  Follow-Up: Your physician wants you to follow-up as needed with Dr. Johnsie Cancel.    If you need a refill on your cardiac medications before your next appointment, please call your pharmacy.

## 2018-02-07 ENCOUNTER — Ambulatory Visit: Payer: BLUE CROSS/BLUE SHIELD | Admitting: Nurse Practitioner

## 2018-02-07 ENCOUNTER — Encounter: Payer: Self-pay | Admitting: Nurse Practitioner

## 2018-02-07 ENCOUNTER — Other Ambulatory Visit (INDEPENDENT_AMBULATORY_CARE_PROVIDER_SITE_OTHER): Payer: BLUE CROSS/BLUE SHIELD

## 2018-02-07 VITALS — BP 118/76 | HR 91 | Temp 97.7°F | Resp 16 | Ht 64.5 in | Wt 246.1 lb

## 2018-02-07 DIAGNOSIS — E785 Hyperlipidemia, unspecified: Secondary | ICD-10-CM

## 2018-02-07 DIAGNOSIS — Z0000 Encounter for general adult medical examination without abnormal findings: Secondary | ICD-10-CM | POA: Diagnosis not present

## 2018-02-07 DIAGNOSIS — I10 Essential (primary) hypertension: Secondary | ICD-10-CM

## 2018-02-07 DIAGNOSIS — Z794 Long term (current) use of insulin: Secondary | ICD-10-CM | POA: Diagnosis not present

## 2018-02-07 DIAGNOSIS — E1165 Type 2 diabetes mellitus with hyperglycemia: Secondary | ICD-10-CM

## 2018-02-07 LAB — CBC
HCT: 41.1 % (ref 36.0–46.0)
HEMOGLOBIN: 14.3 g/dL (ref 12.0–15.0)
MCHC: 34.7 g/dL (ref 30.0–36.0)
MCV: 90.8 fl (ref 78.0–100.0)
Platelets: 292 10*3/uL (ref 150.0–400.0)
RBC: 4.53 Mil/uL (ref 3.87–5.11)
RDW: 14.2 % (ref 11.5–15.5)
WBC: 9.6 10*3/uL (ref 4.0–10.5)

## 2018-02-07 LAB — HEMOGLOBIN A1C: Hgb A1c MFr Bld: 6.1 % (ref 4.6–6.5)

## 2018-02-07 MED ORDER — HYDROCHLOROTHIAZIDE 12.5 MG PO TABS: 12.5000 mg | ORAL_TABLET | Freq: Every day | ORAL | 2 refills | Status: DC

## 2018-02-07 NOTE — Patient Instructions (Addendum)
Please head downstairs for lab work.  Please continue to monitor your blood pressure, our goal is to keep your blood pressure under 140/90.  Please return in about 3 months so we can make sure your blood pressure remains stable.   Mediterranean Diet A Mediterranean diet refers to food and lifestyle choices that are based on the traditions of countries located on the [employer]. This way of eating has been shown to help prevent certain conditions and improve outcomes for people who have chronic diseases, like kidney disease and heart disease. What are tips for following this plan? Lifestyle  Cook and eat meals together with your family, when possible.  Drink enough fluid to keep your urine clear or pale yellow.  Be physically active every day. This includes: ? Aerobic exercise like running or swimming. ? Leisure activities like gardening, walking, or housework.  Get 7-8 hours of sleep each night.  If recommended by your health care provider, drink red wine in moderation. This means 1 glass a day for nonpregnant women and 2 glasses a day for men. A glass of wine equals 5 oz (150 mL). Reading food labels  Check the serving size of packaged foods. For foods such as rice and pasta, the serving size refers to the amount of cooked product, not dry.  Check the total fat in packaged foods. Avoid foods that have saturated fat or trans fats.  Check the ingredients list for added sugars, such as corn syrup. Shopping  At the grocery store, buy most of your food from the areas near the walls of the store. This includes: ? Fresh fruits and vegetables (produce). ? Grains, beans, nuts, and seeds. Some of these may be available in unpackaged forms or large amounts (in bulk). ? Fresh seafood. ? Poultry and eggs. ? Low-fat dairy products.  Buy whole ingredients instead of prepackaged foods.  Buy fresh fruits and vegetables in-season from local farmers markets.  Buy frozen fruits and  vegetables in resealable bags.  If you do not have access to quality fresh seafood, buy precooked frozen shrimp or canned fish, such as tuna, salmon, or sardines.  Buy small amounts of raw or cooked vegetables, salads, or olives from the deli or salad bar at your store.  Stock your pantry so you always have certain foods on hand, such as olive oil, canned tuna, canned tomatoes, rice, pasta, and beans. Cooking  Cook foods with extra-virgin olive oil instead of using butter or other vegetable oils.  Have meat as a side dish, and have vegetables or grains as your main dish. This means having meat in small portions or adding small amounts of meat to foods like pasta or stew.  Use beans or vegetables instead of meat in common dishes like chili or lasagna.  Experiment with different cooking methods. Try roasting or broiling vegetables instead of steaming or sauteing them.  Add frozen vegetables to soups, stews, pasta, or rice.  Add nuts or seeds for added healthy fat at each meal. You can add these to yogurt, salads, or vegetable dishes.  Marinate fish or vegetables using olive oil, lemon juice, garlic, and fresh herbs. Meal planning  Plan to eat 1 vegetarian meal one day each week. Try to work up to 2 vegetarian meals, if possible.  Eat seafood 2 or more times a week.  Have healthy snacks readily available, such as: ? Vegetable sticks with hummus. ? [place] yogurt. ? Fruit and nut trail mix.  Eat balanced meals throughout the week.  This includes: ? Fruit: 2-3 servings a day ? Vegetables: 4-5 servings a day ? Low-fat dairy: 2 servings a day ? Fish, poultry, or lean meat: 1 serving a day ? Beans and legumes: 2 or more servings a week ? Nuts and seeds: 1-2 servings a day ? Whole grains: 6-8 servings a day ? Extra-virgin olive oil: 3-4 servings a day  Limit red meat and sweets to only a few servings a month What are my food choices?  Mediterranean diet ? Recommended ? Grains:  Whole-grain pasta. Brown rice. Bulgar wheat. Polenta. Couscous. Whole-wheat bread. [place]. ? Vegetables: Artichokes. Beets. Broccoli. Cabbage. Carrots. Eggplant. Green beans. Chard. Kale. Spinach. Onions. Leeks. Peas. Squash. Tomatoes. Peppers. Radishes. ? Fruits: Apples. Apricots. Avocado. Berries. Bananas. Cherries. Dates. Figs. Grapes. Lemons. Melon. Oranges. Peaches. Plums. Pomegranate. ? Meats and other protein foods: Beans. Almonds. Sunflower seeds. Pine nuts. Peanuts. [place]. Salmon. Scallops. Shrimp. [place]. Tilapia. Clams. Oysters. Eggs. ? Dairy: Low-fat milk. Cheese. Greek yogurt. ? Beverages: Water. Red wine. Herbal tea. ? Fats and oils: Extra virgin olive oil. Avocado oil. Grape seed oil. ? Sweets and desserts: [place] yogurt with honey. Baked apples. Poached pears. Trail mix. ? Seasoning and other foods: Basil. Cilantro. Coriander. Cumin. Mint. Parsley. Sage. Rosemary. Tarragon. Garlic. Oregano. Thyme. Pepper. Balsalmic vinegar. Tahini. Hummus. Tomato sauce. Olives. Mushrooms. ? Limit these ? Grains: Prepackaged pasta or rice dishes. Prepackaged cereal with added sugar. ? Vegetables: Deep fried potatoes (french fries). ? Fruits: Fruit canned in syrup. ? Meats and other protein foods: Beef. Pork. Lamb. Poultry with skin. Hot dogs. [place] Salines. ? Dairy: Ice cream. Sour cream. Whole milk. ? Beverages: Juice. Sugar-sweetened soft drinks. Beer. Liquor and spirits. ? Fats and oils: Butter. Canola oil. Vegetable oil. Beef fat (tallow). Lard. ? Sweets and desserts: Cookies. Cakes. Pies. Candy. ? Seasoning and other foods: Mayonnaise. Premade sauces and marinades. ? The items listed may not be a complete list. Talk with your dietitian about what dietary choices are right for you. Summary  The Mediterranean diet includes both food and lifestyle choices.  Eat a variety of fresh fruits and vegetables, beans, nuts, seeds, and whole grains.  Limit the amount of red meat and sweets that you  eat.  Talk with your health care provider about whether it is safe for you to drink red wine in moderation. This means 1 glass a day for nonpregnant women and 2 glasses a day for men. A glass of wine equals 5 oz (150 mL). This information is not intended to replace advice given to you by your health care provider. Make sure you discuss any questions you have with your health care provider. Document Released: 06/29/2016 Document Revised: 08/01/2016 Document Reviewed: 06/29/2016 Elsevier Interactive Patient Education  [employer].

## 2018-02-07 NOTE — Progress Notes (Signed)
Name: Destiny Tucker   MRN: [id number]    DOB: 08-31-1956   Date:02/07/2018       Progress Note  Subjective  Chief Complaint  Chief Complaint  Patient presents with  . Follow-up    wants 90 day of lovastatin and klonopin    HPI Destiny Tucker is here today for a blood pressure follow up. She is also requesting a refill of her lovastatin. Over her past few months she has been evaluated by me for dizziness and tachycardia with normal workup so far and referrals to cardiology and neurology. She has been for cardiology evaluation with recommendation for stress test and echo which she is currently waiting to schedule and has a neurology follow up in May.  Hypertension -maintained on HCTZ 12.5 for some time and losartan 50 was added at her last OV due to high readings. Reports daily medication compliance without adverse medication effects. Reports she has not been checking blood pressures routinely at home but has been checking occasionally at drug store with readings 150s/70s Denies headaches, vision changes, chest pain, shortness of breath, edema.  BP Readings from Last 3 Encounters:  02/07/18 118/76  01/24/18 128/74  12/31/17 (!) 146/84   Cholesterol- maintained on lovastatin 20  Reports daily medication compliance without adverse medication effects including myalgias. Exercise-She has started working out every other day on the elliptical machine to try to improve her health.  Lab Results  Component Value Date   CHOL 146 03/14/2017   HDL 39.00 (L) 03/14/2017   LDLCALC 59 01/10/2016   LDLDIRECT 83.0 03/14/2017   TRIG 205.0 (H) 03/14/2017   CHOLHDL 4 03/14/2017     Patient Active Problem List   Diagnosis Date Noted  . Routine adult health maintenance 05/21/2017  . Obesity 12/22/2016  . Degenerative arthritis of knee 10/25/2016  . Right knee pain 05/09/2016  . Left hip pain 03/16/2016  . Left knee pain 03/16/2016  . Anterior knee pain 01/28/2016  . Trochanteric bursitis of  right hip 01/28/2016  . Acute sinusitis 09/28/2015  . Carpal tunnel syndrome 09/28/2015  . Pre-employment health screening examination 05/26/2015  . Facial swelling 04/28/2015  . Muscle spasms of neck 03/09/2015  . Contusion of right hip 03/09/2015  . Bilateral lower extremity edema 03/09/2015  . Degenerative arthritis of left knee 01/26/2015  . 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 Bladder problem 08/02/2014  . Type II or unspecified type diabetes mellitus without mention of complication, uncontrolled 08/30/2010  . HYPERLIPIDEMIA 05/18/2010  . Depression 05/18/2010  . Essential hypertension 05/18/2010  . Allergic rhinitis 05/18/2010  . TOBACCO USE, QUIT 05/18/2010  . HYSTEROSCOPY, HX OF 05/18/2010  . Anxiety state 10/26/2008  . HEMORRHOIDS, INTERNAL 10/26/2008  . OSTEOPENIA 10/26/2008    Past Surgical History:  Procedure Laterality Date  . ANUS SURGERY    . MOHS SURGERY     Precancerous skin lesion  . Removal of cyst & other ovary  09/2004  . Removal of fallopion tube & ovary  1986  . Unilateral Salpingo-Oophorectomy      Family History  Problem Relation Age of Onset  . Hypertension Mother   . Hyperlipidemia Mother   . Nephrolithiasis Mother   . Diabetes Mother   . Hypertension Father   . Hyperlipidemia Father   . Nephrolithiasis Father   . Diabetes Maternal Uncle     Social History   Socioeconomic History  . Marital status: Married    Spouse name: Not on file  . Number of  children: Not on file  . Years of education: Not on file  . Highest education level: Not on file  Occupational History  . Occupation: Retired    Fish farm manager: [employer]  . Financial resource strain: Not on file  . Food insecurity:    Worry: Not on file    Inability: Not on file  . Transportation needs:    Medical: Not on file    Non-medical: Not on file  Tobacco Use  . Smoking status: Former Smoker    Last attempt to quit: 02/18/2010    Years since  quitting: 7.9  . Smokeless tobacco: Former Systems developer  . Tobacco comment: Married, lives with spouse. 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 Alcohol use: Yes    Comment: 2x per year  . Drug use: No  . Sexual activity: Not on file  Lifestyle  . Physical activity:    Days per week: Not on file    Minutes per session: Not on file  . Stress: Not on file  Relationships  . Social connections:    Talks on phone: Not on file    Gets together: Not on file    Attends religious service: Not on file    Active member of club or organization: Not on file    Attends meetings of clubs or organizations: Not on file    Relationship status: Not on file  . Intimate partner violence:    Fear of current or ex partner: Not on file    Emotionally abused: Not on file    Physically abused: Not on file    Forced sexual activity: Not on file  Other Topics Concern  . Not on file  Social History Narrative  . Not on file     Current Outpatient Medications:  .  Alcohol Swabs (B-D SINGLE USE SWABS REGULAR) PADS, USE AS DIRECTED TWICE DAILY, Disp: 100 each, Rfl: 9 .  clonazePAM (KLONOPIN) 1 MG tablet, TAKE 1 TABLET BY MOUTH TWICE DAILY AS NEEDED FOR ANXIETY, Disp: 60 tablet, Rfl: 0 .  cyclobenzaprine (FLEXERIL) 5 MG tablet, Take 1 tablet (5 mg total) by mouth at bedtime., Disp: 14 tablet, Rfl: 0 .  diphenhydrAMINE (BENADRYL) 25 MG tablet, Take 25 mg by mouth every 6 (six) hours as needed., Disp: , Rfl:  .  HUMULIN R U-500 KWIKPEN 500 UNIT/ML kwikpen, INJECT 45 UNITS INTO THE SKIN IN THE MORNING AND 55 UNITS IN THE EVENING, Disp: 6 mL, Rfl: 7 .  hydrochlorothiazide (HYDRODIURIL) 12.5 MG tablet, Take 1 tablet (12.5 mg total) by mouth daily., Disp: 90 tablet, Rfl: 2 .  Insulin Pen Needle (NOVOFINE) 32G X 6 MM MISC, Use to inject 2 times per day, Disp: 300 each, Rfl: 2 .  Insulin Pen Needle 31G X 8 MM MISC, by Does not apply route., Disp: , Rfl:  .  Insulin Syringe-Needle  U-100 (B-D INSULIN SYRINGE) 31G X 5/16" 0.3 ML MISC, Use 2 syringes daily with Levemir Insulin, Disp: 100 each, Rfl: 3 .  INVOKANA 100 MG TABS tablet, TAKE 1 TABLET BY MOUTH ONCE DAILY BEFORE BREAKFAST, Disp: 90 tablet, Rfl: 1 .  losartan (COZAAR) 50 MG tablet, Take 1 tablet (50 mg total) by mouth daily., Disp: 30 tablet, Rfl: 1 .  lovastatin (MEVACOR) 20 MG tablet, Take 1 tablet (20 mg total) by mouth daily with breakfast., Disp: 90 tablet, Rfl: 1 .  metFORMIN (GLUCOPHAGE) 1000 MG tablet, TAKE ONE TABLET BY MOUTH  TWICE DAILY WITH FOOD (Patient taking differently: TAKE ONE TABLET BY MOUTH DAILY WITH FOOD), Disp: 180 tablet, Rfl: 2 .  naproxen (NAPROSYN) 500 MG tablet, Take 1 tablet (500 mg total) by mouth 2 (two) times daily with a meal., Disp: 60 tablet, Rfl: 0 .  sertraline (ZOLOFT) 100 MG tablet, TAKE 1 & 1/2 (ONE & ONE-HALF) TABLETS BY MOUTH ONCE DAILY, Disp: 45 tablet, Rfl: 1  Allergies  Allergen Reactions  . Codeine      ROS See HPI  Objective  Vitals:   02/07/18 1414 02/07/18 1506  BP: 110/68 118/76  Pulse: 91   Resp: 16   Temp: 97.7 F (36.5 C)   TempSrc: Oral   SpO2: 95%   Weight: 246 lb 1.9 oz (111.6 kg)   Height: 5' 4.5" (1.638 m)    Body mass index is 41.59 kg/m.  Physical Exam Vital signs reviewed Constitutional: Patient appears well-developed and well-nourished. Obese. No distress.  HENT: Head: Normocephalic and atraumatic. Nose: Nose normal. Mouth/Throat: Oropharynx is clear and moist.   Eyes: Conjunctivae and EOM are normal. Pupils equal, round and reactive to light. No scleral icterus.  Neck: Normal range of motion. Neck supple.  Cardiovascular: Normal rate, regular rhythm and normal heart sounds. No BLE edema. Distal pulses intact. Pulmonary/Chest: Effort normal and breath sounds normal. No respiratory distress. Neurological:She is alert and oriented to person, place, and time.  Coordination, balance, strength, speech and gait are normal.  Skin: Skin is  warm and dry. No rash noted.  Psychiatric: Patient has a normal mood and affect. Behavior is normal. Judgment and thought content normal.   Assessment & Plan RTC in 3 months for F/U of hypertension- recheck BP

## 2018-02-08 ENCOUNTER — Telehealth: Payer: Self-pay | Admitting: Nurse Practitioner

## 2018-02-08 ENCOUNTER — Telehealth: Payer: Self-pay | Admitting: Internal Medicine

## 2018-02-08 DIAGNOSIS — E1165 Type 2 diabetes mellitus with hyperglycemia: Secondary | ICD-10-CM

## 2018-02-08 LAB — BASIC METABOLIC PANEL
BUN: 15 mg/dL (ref 6–23)
CALCIUM: 9.4 mg/dL (ref 8.4–10.5)
CO2: 26 mEq/L (ref 19–32)
Chloride: 103 mEq/L (ref 96–112)
Creatinine, Ser: 0.51 mg/dL (ref 0.40–1.20)
GFR: 129.42 mL/min (ref >60.00)
GLUCOSE: 135 mg/dL — AB (ref 70–99)
Potassium: 3.5 mEq/L (ref 3.5–5.1)
Sodium: 137 mEq/L (ref 135–145)

## 2018-02-08 LAB — LIPID PANEL
CHOLESTEROL: 131 mg/dL (ref 0–200)
HDL: 40.6 mg/dL (ref >39.00)
NonHDL: 90.68
TRIGLYCERIDES: 204 mg/dL — AB (ref 0.0–149.0)
Total CHOL/HDL Ratio: 3
VLDL: 40.8 mg/dL — AB (ref 0.0–40.0)

## 2018-02-08 LAB — LDL CHOLESTEROL, DIRECT: LDL DIRECT: 75 mg/dL

## 2018-02-08 NOTE — Telephone Encounter (Signed)
Copied from [place]. Topic: Quick Communication - See Telephone Encounter >> Feb 08, 2018 12:12 PM Lolita Rieger, RMA wrote: CRM for notification. See Telephone encounter for: 02/08/18. Referral needed for Endo Dr.Kerr at [facility] as discussed during appt

## 2018-02-08 NOTE — Telephone Encounter (Signed)
Referral placed.

## 2018-02-11 ENCOUNTER — Encounter: Payer: Self-pay | Admitting: Nurse Practitioner

## 2018-02-11 NOTE — Assessment & Plan Note (Signed)
She is not fasting today but would prefer to update cholesterol panel while here Continue lovastatin at current dosage with F/U recommendations pending lab results - Lipid panel; Future

## 2018-02-11 NOTE — Assessment & Plan Note (Signed)
BP appears stable on last 2 readings We will continue current medications and she will monitor BP at home with BP goal < 140/90 Discussed the role of healthy diet and exercise in the management of hypertension- See AVS for additional information provided to patient RTC in 3 months for BP follow up, can consider decreasing follow ups if BP remains stable in 3 months - Basic metabolic panel; Future - hydrochlorothiazide (HYDRODIURIL) 12.5 MG tablet; Take 1 tablet (12.5 mg total) by mouth daily.  Dispense: 90 tablet; Refill: 2

## 2018-02-13 ENCOUNTER — Ambulatory Visit (HOSPITAL_COMMUNITY): Payer: Self-pay

## 2018-02-13 ENCOUNTER — Other Ambulatory Visit (HOSPITAL_COMMUNITY): Payer: Self-pay

## 2018-02-14 ENCOUNTER — Ambulatory Visit (HOSPITAL_COMMUNITY): Payer: Self-pay

## 2018-02-18 LAB — HM MAMMOGRAPHY

## 2018-02-20 ENCOUNTER — Encounter: Payer: Self-pay | Admitting: Nurse Practitioner

## 2018-02-28 ENCOUNTER — Encounter: Payer: Self-pay | Admitting: Endocrinology

## 2018-03-04 ENCOUNTER — Other Ambulatory Visit: Payer: Self-pay | Admitting: Nurse Practitioner

## 2018-03-04 NOTE — Telephone Encounter (Signed)
Last refill was 01/20/18 per database.

## 2018-03-05 ENCOUNTER — Other Ambulatory Visit: Payer: Self-pay | Admitting: Nurse Practitioner

## 2018-03-05 DIAGNOSIS — I10 Essential (primary) hypertension: Secondary | ICD-10-CM

## 2018-03-06 ENCOUNTER — Telehealth: Payer: Self-pay | Admitting: Endocrinology

## 2018-03-06 NOTE — Telephone Encounter (Signed)
Patient dismissed from [facility] by Elayne Snare MD, effective February 28, 2018. Dismissal letter sent out by certified / registered mail.  daj

## 2018-03-14 NOTE — Telephone Encounter (Signed)
Received signed domestic return receipt verifying delivery of certified letter on March 12, 2018. Article number [id number]

## 2018-03-20 ENCOUNTER — Other Ambulatory Visit: Payer: Self-pay | Admitting: Nurse Practitioner

## 2018-03-25 ENCOUNTER — Other Ambulatory Visit: Payer: Self-pay | Admitting: Endocrinology

## 2018-03-25 ENCOUNTER — Telehealth: Payer: Self-pay | Admitting: Nurse Practitioner

## 2018-03-25 NOTE — Telephone Encounter (Signed)
Copied from [place] [phone]. Topic: Quick Communication - See Telephone Encounter >> Mar 25, 2018  3:39 PM Aurelio Brash B wrote: CRM for notification. See Telephone encounter for: 03/25/18.  PT is no longer  seeing  Dr Dwyane Dee,  and wants to know if Caesar Chestnut can refill her insulin   until  she can  get in with another endocrinologist  HUMULIN R U-500 KWIKPEN 500 UNIT/ML kwikpen She will be out of insulin  in the next 5 - 6 days  [place], [place] - [place] [phone] (Phone) [phone] (Fax)

## 2018-03-25 NOTE — Telephone Encounter (Signed)
Patient was dismissed from [facility] due to not making follow up appointments.

## 2018-03-25 NOTE — Telephone Encounter (Signed)
Are you ok filling her insulin?

## 2018-03-26 MED ORDER — INSULIN REGULAR HUMAN (CONC) 500 UNIT/ML ~~LOC~~ SOPN: PEN_INJECTOR | SUBCUTANEOUS | 2 refills | Status: DC

## 2018-03-26 NOTE — Telephone Encounter (Signed)
Pt said she is trying to get into see dr Buddy Duty. Pt needs refill on humulin kwikpen. [facility] [address]. Pt said pharm will fax a refill request

## 2018-03-26 NOTE — Telephone Encounter (Signed)
Yes I am okay with refilling her insulin. Is she planning to see another endocrinologist? Does she need a referral?

## 2018-03-27 ENCOUNTER — Encounter: Payer: Self-pay | Admitting: Neurology

## 2018-03-27 ENCOUNTER — Other Ambulatory Visit: Payer: Self-pay

## 2018-03-27 ENCOUNTER — Ambulatory Visit (INDEPENDENT_AMBULATORY_CARE_PROVIDER_SITE_OTHER): Payer: BLUE CROSS/BLUE SHIELD | Admitting: Neurology

## 2018-03-27 VITALS — BP 130/86 | HR 94 | Ht 64.5 in | Wt 248.0 lb

## 2018-03-27 DIAGNOSIS — G5601 Carpal tunnel syndrome, right upper limb: Secondary | ICD-10-CM | POA: Diagnosis not present

## 2018-03-27 DIAGNOSIS — R2681 Unsteadiness on feet: Secondary | ICD-10-CM | POA: Diagnosis not present

## 2018-03-27 NOTE — Progress Notes (Signed)
NEUROLOGY CONSULTATION NOTE  Destiny Tucker MRN: [id number] DOB: 17-Jan-1956  Referring provider: Caesar Chestnut, NP Primary care provider: Caesar Chestnut, NP  Reason for consult:  dizziness  Thank you for your kind referral of Destiny Tucker for consultation of the above symptoms. Although her history is well known to you, please allow me to reiterate it for the purpose of our medical record. Records and images were personally reviewed where available.  HISTORY OF PRESENT ILLNESS: This is a 62 year old woman with a history of hypertension, hyperlipidemia, diabetes, presenting for evaluation of dizziness. She reports falling a few times several years ago which she attributed to the wrong shoes. Over the past year, she has noticed that she cannot control her walking. Her knees also hurt more than they used to. She notes their yard is not level but she feels like she can easily fall. Last fall was a few months ago. When she gets up, she has to wait a few minutes or falls back on the bed. She has to hold on to things sometimes. She denies any numbness/tingling in her feet. She notices her right hand frequently gets numb. She has joint pains in both thumbs. When she extends her right hand at the wrist, she feels an electrical sensation in her right hand. She was diagnosed with carpal tunnel syndrome and tried a wrist splint which did not help. She has also noticed memory issues, it is harder to remember things. She denies getting lost driving but has never had a good sense of direction. She denies missing medications.  She denies any headaches, diplopia, dysarthria/dysphagia, neck/back pain, bowel/bladder dysfunction. Her last HbA1c was 6.1. There is a strong family history of diabetes.   Laboratory Data: Lab Results  Component Value Date   TSH 1.63 12/12/2017   Lab Results  Component Value Date   [id number] 443 12/12/2017   Lab Results  Component Value Date   HGBA1C 6.1 02/07/2018      PAST MEDICAL HISTORY: Past Medical History:  Diagnosis Date  . ALLERGIC RHINITIS   . ANXIETY   . DEPRESSION   . Diabetes mellitus ([place])   . Genital herpes   . HYPERLIPIDEMIA   . HYPERTENSION   . Internal hemorrhoids without mention of complication 15/40/0867   Colonoscopy--Dr. Fuller Plan , pts. states hemorrhoids are uncomfortable  . OSTEOPENIA   . TOBACCO USE, QUIT     PAST SURGICAL HISTORY: Past Surgical History:  Procedure Laterality Date  . ANUS SURGERY    . MOHS SURGERY     Precancerous skin lesion  . Removal of cyst & other ovary  09/2004  . Removal of fallopion tube & ovary  1986  . Unilateral Salpingo-Oophorectomy      MEDICATIONS: Current Outpatient Medications on File Prior to Visit  Medication Sig Dispense Refill  . Alcohol Swabs (B-D SINGLE USE SWABS REGULAR) PADS USE AS DIRECTED TWICE DAILY 100 each 9  . clonazePAM (KLONOPIN) 1 MG tablet TAKE 1 TABLET BY MOUTH TWICE DAILY AS NEEDED FOR ANXIETY 60 tablet 0  . cyclobenzaprine (FLEXERIL) 5 MG tablet Take 1 tablet (5 mg total) by mouth at bedtime. 14 tablet 0  . diphenhydrAMINE (BENADRYL) 25 MG tablet Take 25 mg by mouth every 6 (six) hours as needed.    . hydrochlorothiazide (HYDRODIURIL) 12.5 MG tablet Take 1 tablet (12.5 mg total) by mouth daily. 90 tablet 2  . Insulin Pen Needle (NOVOFINE) 32G X 6 MM MISC Use to inject 2 times per day  300 each 2  . Insulin Pen Needle 31G X 8 MM MISC by Does not apply route.    . insulin regular human CONCENTRATED (HUMULIN R U-500 KWIKPEN) 500 UNIT/ML kwikpen INJECT 45 UNITS INTO THE SKIN IN THE MORNING AND 55 UNITS IN THE EVENING 6 mL 2  . Insulin Syringe-Needle U-100 (B-D INSULIN SYRINGE) 31G X 5/16" 0.3 ML MISC Use 2 syringes daily with Levemir Insulin 100 each 3  . INVOKANA 100 MG TABS tablet TAKE 1 TABLET BY MOUTH ONCE DAILY BEFORE BREAKFAST 90 tablet 1  . losartan (COZAAR) 50 MG tablet TAKE 1 TABLET BY MOUTH ONCE DAILY 90 tablet 1  . lovastatin (MEVACOR) 20 MG tablet TAKE  1 TABLET BY MOUTH ONCE DAILY WITH BREAKFAST 90 tablet 1  . metFORMIN (GLUCOPHAGE) 1000 MG tablet TAKE ONE TABLET BY MOUTH TWICE DAILY WITH FOOD (Patient taking differently: TAKE ONE TABLET BY MOUTH DAILY WITH FOOD) 180 tablet 2  . naproxen (NAPROSYN) 500 MG tablet Take 1 tablet (500 mg total) by mouth 2 (two) times daily with a meal. 60 tablet 0  . sertraline (ZOLOFT) 100 MG tablet TAKE 1 & 1/2 (ONE & ONE-HALF) TABLETS BY MOUTH ONCE DAILY 45 tablet 1   No current facility-administered medications on file prior to visit.     ALLERGIES: Allergies  Allergen Reactions  . Codeine     FAMILY HISTORY: Family History  Problem Relation Age of Onset  . Hypertension Mother   . Hyperlipidemia Mother   . Nephrolithiasis Mother   . Diabetes Mother   . Hypertension Father   . Hyperlipidemia Father   . Nephrolithiasis Father   . Diabetes Maternal Uncle     SOCIAL HISTORY: Social History   Socioeconomic History  . Marital status: Married    Spouse name: Not on file  . Number of children: Not on file  . Years of education: Not on file  . Highest education level: Not on file  Occupational History  . Occupation: Retired    Fish farm manager: [employer]  . Financial resource strain: Not on file  . Food insecurity:    Worry: Not on file    Inability: Not on file  . Transportation needs:    Medical: Not on file    Non-medical: Not on file  Tobacco Use  . Smoking status: Former Smoker    Last attempt to quit: 02/18/2010    Years since quitting: 8.1  . Smokeless tobacco: Former Systems developer  . Tobacco comment: Married, lives with spouse. 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 Alcohol use: Yes    Comment: 2x per year  . Drug use: No  . Sexual activity: Not on file  Lifestyle  . Physical activity:    Days per week: Not on file    Minutes per session: Not on file  . Stress: Not on file  Relationships  . Social connections:     Talks on phone: Not on file    Gets together: Not on file    Attends religious service: Not on file    Active member of club or organization: Not on file    Attends meetings of clubs or organizations: Not on file    Relationship status: Not on file  . Intimate partner violence:    Fear of current or ex partner: Not on file    Emotionally abused: Not on file    Physically abused: Not on file    Forced  sexual activity: Not on file  Other Topics Concern  . Not on file  Social History Narrative  . Not on file    REVIEW OF SYSTEMS: Constitutional: No fevers, chills, or sweats, no generalized fatigue, change in appetite Eyes: No visual changes, double vision, eye pain Ear, nose and throat: No hearing loss, ear pain, nasal congestion, sore throat Cardiovascular: No chest pain, palpitations Respiratory:  No shortness of breath at rest or with exertion, wheezes GastrointestinaI: No nausea, vomiting, diarrhea, abdominal pain, fecal incontinence Genitourinary:  No dysuria, urinary retention or frequency Musculoskeletal:  No neck pain, back pain Integumentary: No rash, pruritus, skin lesions Neurological: as above Psychiatric: No depression, insomnia, anxiety Endocrine: No palpitations, fatigue, diaphoresis, mood swings, change in appetite, change in weight, increased thirst Hematologic/Lymphatic:  No anemia, purpura, petechiae. Allergic/Immunologic: no itchy/runny eyes, nasal congestion, recent allergic reactions, rashes  PHYSICAL EXAM: Vitals:   03/27/18 1408  BP: 130/86  Pulse: 94  SpO2: 96%   General: No acute distress Head:  Normocephalic/atraumatic Eyes: Fundoscopic exam shows bilateral sharp discs, no vessel changes, exudates, or hemorrhages Neck: supple, no paraspinal tenderness, full range of motion Back: No paraspinal tenderness Heart: regular rate and rhythm Lungs: Clear to auscultation bilaterally. Vascular: No carotid bruits. Skin/Extremities: No rash, no  edema Neurological Exam: Mental status: alert and oriented to person, place, and time, no dysarthria or aphasia, Fund of knowledge is appropriate.  Recent and remote memory are intact.  Attention and concentration are normal.    Able to name objects and repeat phrases. Cranial nerves: CN I: not tested CN II: pupils equal, round and reactive to light, visual fields intact, fundi unremarkable. CN III, IV, VI:  full range of motion, no nystagmus, no ptosis CN V: facial sensation intact CN VII: upper and lower face symmetric CN VIII: hearing intact to finger rub CN IX, X: gag intact, uvula midline CN XI: sternocleidomastoid and trapezius muscles intact CN XII: tongue midline Bulk & Tone: normal, no fasciculations. Motor: 5/5 throughout with no pronator drift. Sensation: intact to light touch, cold, pin, vibration and joint position sense.  No extinction to double simultaneous stimulation.  Romberg test negative Deep Tendon Reflexes: +2 throughout except for absent ankle jerks bilaterally, no ankle clonus Plantar responses: downgoing bilaterally Cerebellar: no incoordination on finger to nose, heel to shin. No dysdiadochokinesia Gait: unsteady, unable to tandem walk, reporting bilateral knee pain Tremor: none  IMPRESSION: This is a 62 year old right-handed woman with a history of hypertension, hyperlipidemia, diabetes, presenting with dizziness described as gait imbalance/instability. She has had frequent falls. Etiology of symptoms unclear, symptoms suggestive of possible sensory ataxia from neuropathy, however her exam today does not show any significant neuropathy. MRI brain with and without contrast will be ordered to assess for underlying structural abnormality. EMG/NCV of the right UE and LE will be ordered to further evaluate symptoms (she reports right hand numbness in addition to gait instability). We discussed doing Balance therapy, referral will be sent. She will follow-up in 6 months  and knows to call for any changes.   Thank you for allowing me to participate in the care of this patient. Please do not hesitate to call for any questions or concerns.   Ellouise Newer, M.D.  CC: Caesar Chestnut, NP

## 2018-03-27 NOTE — Patient Instructions (Addendum)
1. Schedule MRI brain with and without contrast 2. Schedule EMG/NCV of the right UE and LE with Dr. Posey Pronto 3. Recommend Balance Therapy 4. Follow-up in 6 months, call for any changes

## 2018-03-29 NOTE — Telephone Encounter (Signed)
Rx sent 

## 2018-04-10 ENCOUNTER — Other Ambulatory Visit: Payer: Self-pay | Admitting: *Deleted

## 2018-04-13 ENCOUNTER — Other Ambulatory Visit: Payer: Self-pay | Admitting: Nurse Practitioner

## 2018-04-13 DIAGNOSIS — F32A Depression, unspecified: Secondary | ICD-10-CM

## 2018-04-13 DIAGNOSIS — F329 Major depressive disorder, single episode, unspecified: Secondary | ICD-10-CM

## 2018-04-16 ENCOUNTER — Other Ambulatory Visit: Payer: Self-pay | Admitting: Endocrinology

## 2018-04-19 ENCOUNTER — Other Ambulatory Visit: Payer: Self-pay | Admitting: Endocrinology

## 2018-04-26 ENCOUNTER — Other Ambulatory Visit: Payer: Self-pay | Admitting: Nurse Practitioner

## 2018-04-30 NOTE — Telephone Encounter (Signed)
sent 

## 2018-04-30 NOTE — Telephone Encounter (Signed)
Last refill was 03/04/18 #60.

## 2018-04-30 NOTE — Telephone Encounter (Signed)
Pt following up on refill request.  The pharmacy advised pt to call her pcp

## 2018-05-07 ENCOUNTER — Other Ambulatory Visit: Payer: Self-pay | Admitting: Endocrinology

## 2018-05-07 ENCOUNTER — Ambulatory Visit: Payer: BLUE CROSS/BLUE SHIELD

## 2018-05-28 ENCOUNTER — Other Ambulatory Visit: Payer: Self-pay

## 2018-06-26 ENCOUNTER — Other Ambulatory Visit: Payer: Self-pay | Admitting: Nurse Practitioner

## 2018-06-27 NOTE — Telephone Encounter (Signed)
Last refill was 04/30/2018 per database. Please advise.

## 2018-07-01 LAB — HM DIABETES EYE EXAM

## 2018-07-14 ENCOUNTER — Other Ambulatory Visit: Payer: Self-pay | Admitting: Nurse Practitioner

## 2018-07-14 DIAGNOSIS — F32A Depression, unspecified: Secondary | ICD-10-CM

## 2018-07-14 DIAGNOSIS — F329 Major depressive disorder, single episode, unspecified: Secondary | ICD-10-CM

## 2018-08-13 ENCOUNTER — Other Ambulatory Visit: Payer: Self-pay | Admitting: Nurse Practitioner

## 2018-08-13 NOTE — Telephone Encounter (Signed)
Check [employer] registry last filled 06/28/2018.Marland KitchenJohny Chess

## 2018-08-14 NOTE — Telephone Encounter (Signed)
Asheligh approved and sent electronically to pof.Marland KitchenJohny Chess

## 2018-08-18 IMAGING — CT CT HEAD W/O CM
3 series · 15 of 33 positions shown, 18 images · non-contrast
Comparison: None

CLINICAL DATA: Imbalance.  Gait disturbance.  Hypertension.

EXAM:
CT HEAD WITHOUT CONTRAST
TECHNIQUE: Contiguous axial images were obtained from the base of the skull
through the vertex without intravenous contrast.

[Series 2: head 5.0 h37s · axial · 0.41mm/px · z∈[+130,+240]mm · 7 of 27 slices shown, 9 images]
[im 3/27  soft-tissue]
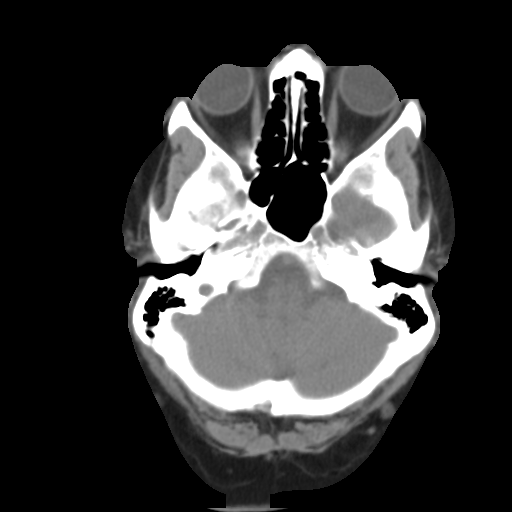
[im 3/27  bone]
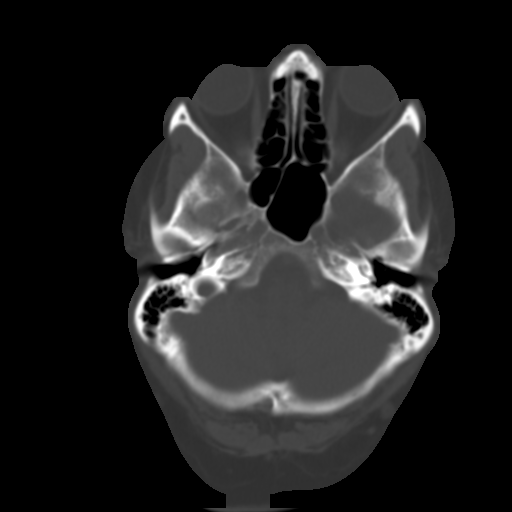
[im 7/27  bone]
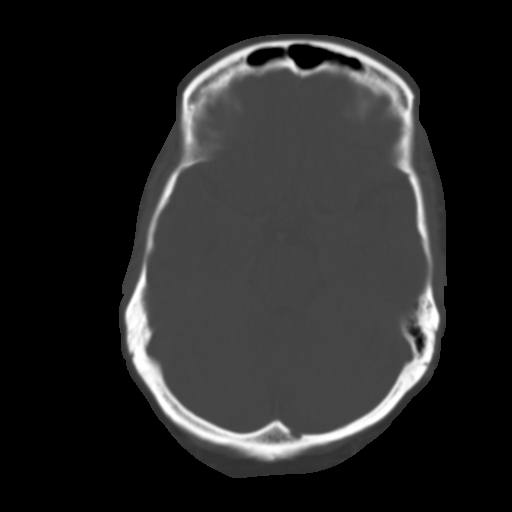
[im 11/27  bone]
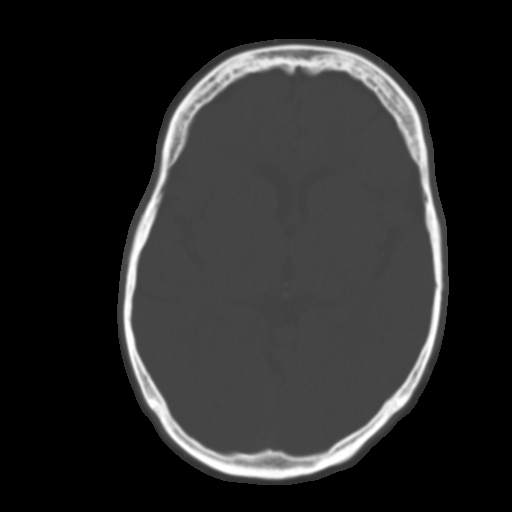
[im 15/27  bone]
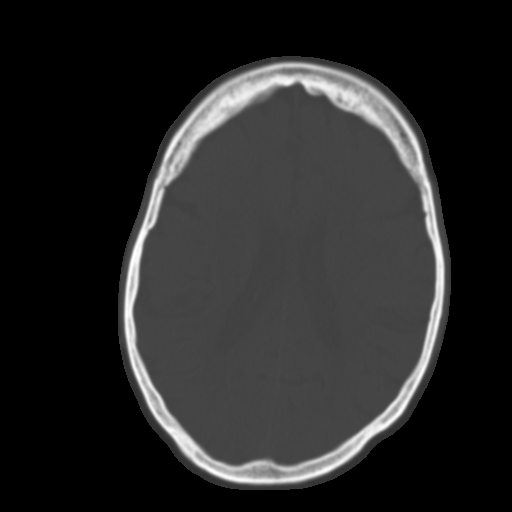
[im 17/27  soft-tissue]
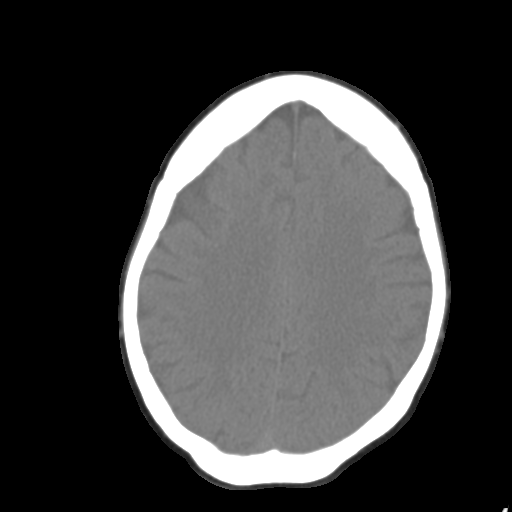
[im 17/27  bone]
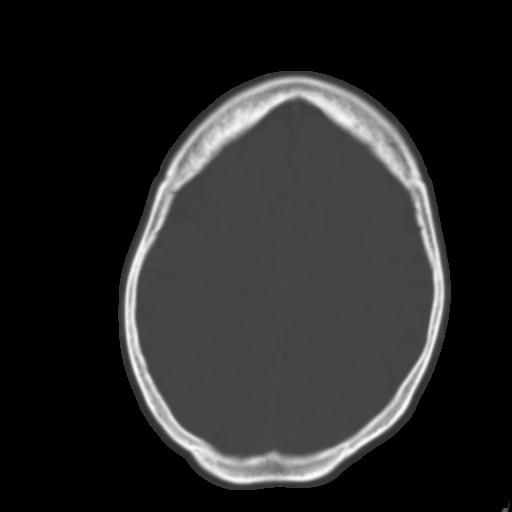
[im 21/27  bone]
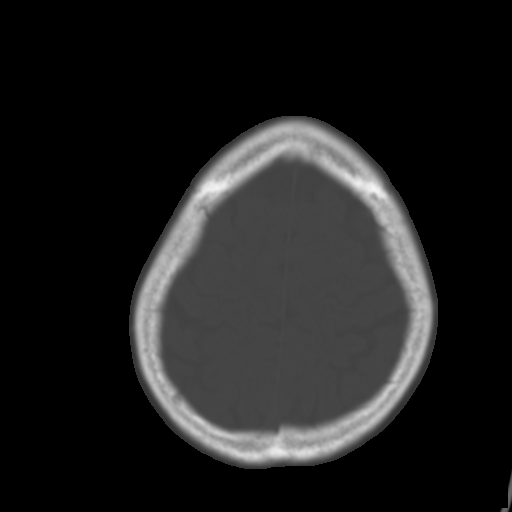
[im 25/27  bone]
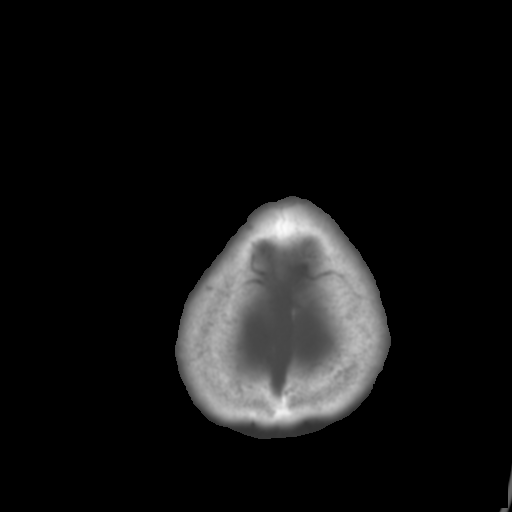

[Series 4: head 3.0 mpr cor · coronal · 0.29mm/px · 3 of 63 slices shown]
[im 13/63  bone]
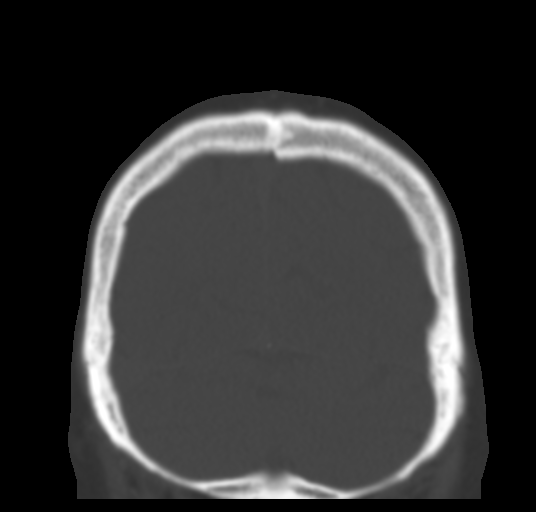
[im 25/63  bone]
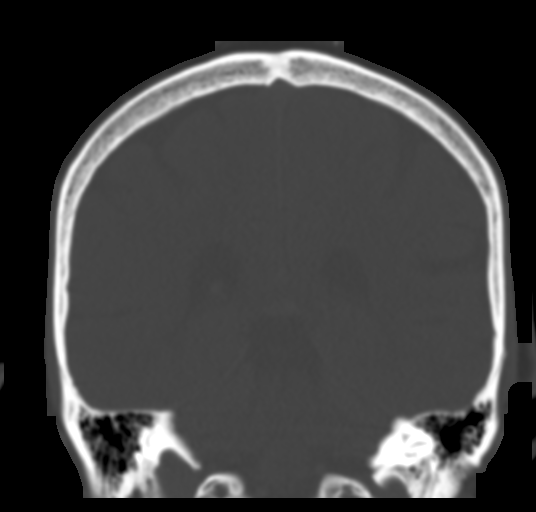
[im 38/63  bone]
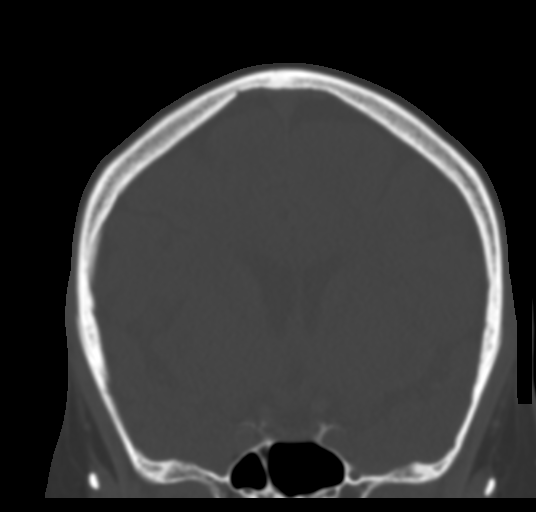

[Series 5: head 3.0 mpr sag · sagittal · 0.27mm/px · 5 of 51 slices shown, 6 images]
[im 17/51  bone]
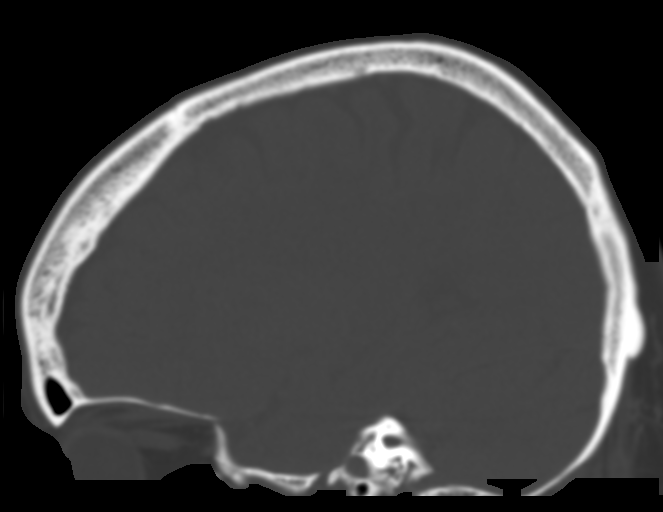
[im 21/51  bone]
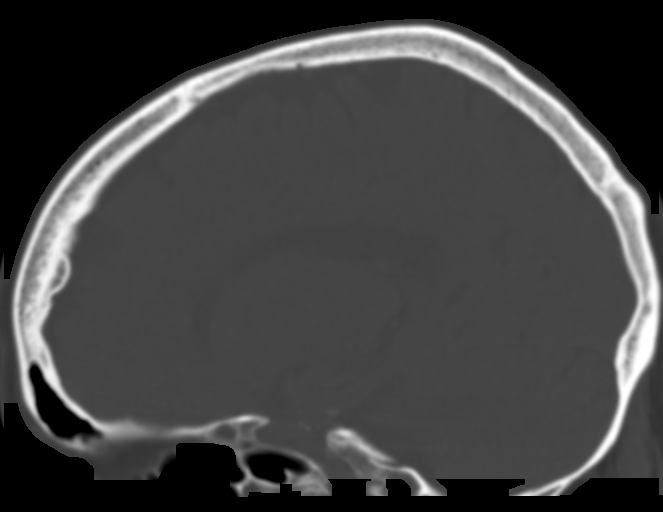
[im 26/51  soft-tissue]
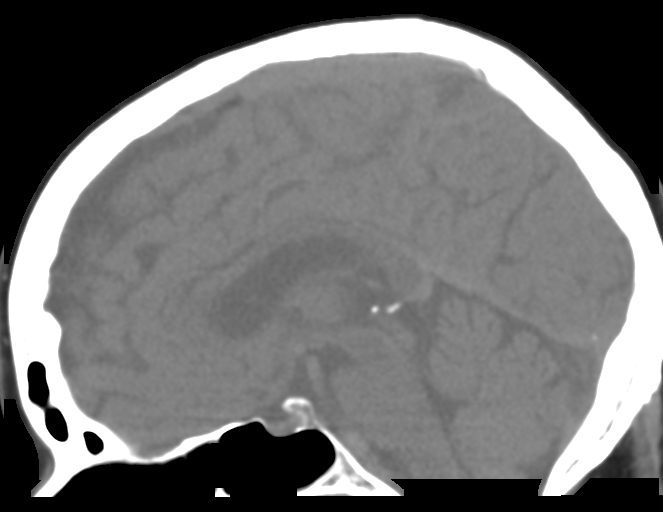
[im 26/51  bone]
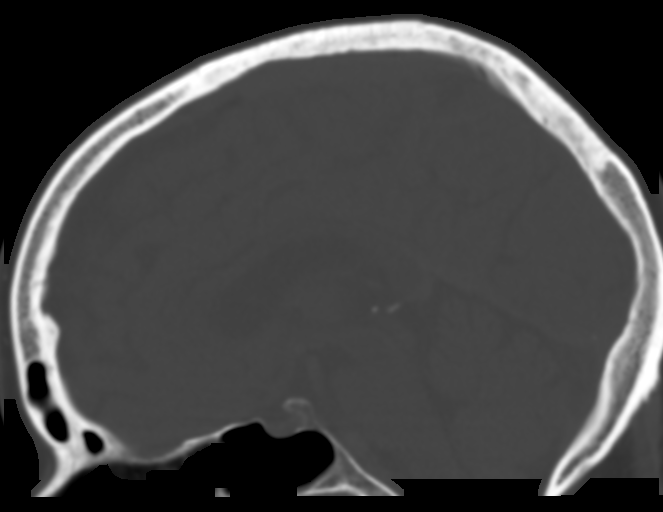
[im 30/51  bone]
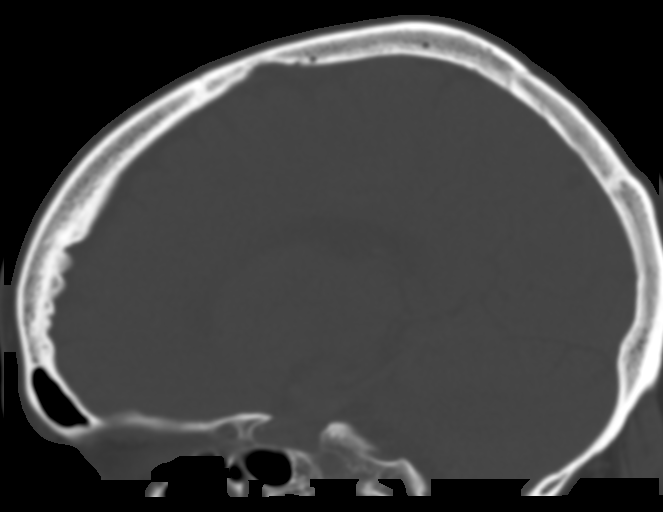
[im 34/51  bone]
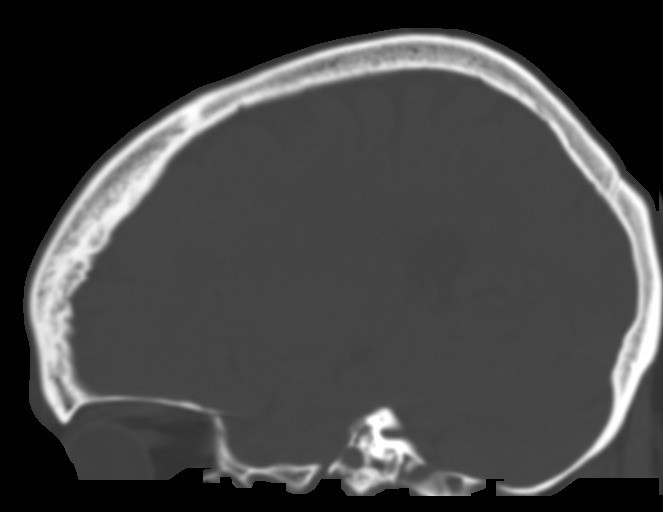

[15 of 33 positions shown; findings below may reference images not displayed]

FINDINGS: Brain: There is no evidence of acute infarct, intracranial
hemorrhage, mass, midline shift, or extra-axial fluid collection.
The ventricles and sulci are normal. Periventricular white matter
hypodensities are nonspecific but compatible with mild chronic small
vessel ischemic disease.

Vascular: Calcified atherosclerosis in the carotid siphons. No
hyperdense vessel.

Skull: No fracture or focal osseous lesion.

Sinuses/Orbits: Partially visualize right maxillary sinus mucosal
thickening. Clear mastoid air cells. Visualized orbits are
unremarkable.

Other: None.
IMPRESSION: 1. No evidence of acute intracranial abnormality.
2. Mild chronic small vessel ischemic disease.

## 2018-08-19 ENCOUNTER — Telehealth: Payer: Self-pay | Admitting: *Deleted

## 2018-08-19 MED ORDER — ALBUTEROL SULFATE HFA 108 (90 BASE) MCG/ACT IN AERS: 2.0000 | INHALATION_SPRAY | Freq: Four times a day (QID) | RESPIRATORY_TRACT | 1 refills | Status: DC | PRN

## 2018-08-19 NOTE — Telephone Encounter (Signed)
Albuterol sent She needs to be seen If no better in 48 hours

## 2018-08-19 NOTE — Telephone Encounter (Signed)
Patient called c/o wheezing x 2 weeks. She denies SOB, cough or any other sxs. She states her wheezing is caused by seasonal allergies and is requesting Albuterol HFA to use prn be sent in. She states she has been prescribed this before by another provider. Please advise.

## 2018-08-19 NOTE — Telephone Encounter (Signed)
Pt informed of below.  

## 2018-08-21 ENCOUNTER — Other Ambulatory Visit: Payer: Self-pay | Admitting: Nurse Practitioner

## 2018-08-22 MED ORDER — INSULIN REGULAR HUMAN (CONC) 500 UNIT/ML ~~LOC~~ SOPN: PEN_INJECTOR | SUBCUTANEOUS | 1 refills | Status: DC

## 2018-08-22 NOTE — Addendum Note (Signed)
Addended by: Cresenciano Lick on: 08/22/2018 08:27 AM   Modules accepted: Orders

## 2018-09-03 ENCOUNTER — Other Ambulatory Visit: Payer: Self-pay | Admitting: Nurse Practitioner

## 2018-09-03 DIAGNOSIS — I10 Essential (primary) hypertension: Secondary | ICD-10-CM

## 2018-09-05 ENCOUNTER — Other Ambulatory Visit: Payer: Self-pay | Admitting: Nurse Practitioner

## 2018-09-08 ENCOUNTER — Encounter: Payer: Self-pay | Admitting: Nurse Practitioner

## 2018-09-09 ENCOUNTER — Other Ambulatory Visit: Payer: Self-pay | Admitting: Nurse Practitioner

## 2018-09-09 MED ORDER — LOVASTATIN 20 MG PO TABS: 20.0000 mg | ORAL_TABLET | Freq: Every day | ORAL | 0 refills | Status: DC

## 2018-09-19 ENCOUNTER — Other Ambulatory Visit: Payer: Self-pay | Admitting: Nurse Practitioner

## 2018-09-20 NOTE — Telephone Encounter (Signed)
[place] Controlled Database Checked Last filled: 08/14/18 # 60 LOV w/you: 02/07/18 Next appt w/you: 10/10/18

## 2018-10-10 ENCOUNTER — Ambulatory Visit: Payer: BLUE CROSS/BLUE SHIELD | Admitting: Nurse Practitioner

## 2018-10-10 ENCOUNTER — Other Ambulatory Visit (INDEPENDENT_AMBULATORY_CARE_PROVIDER_SITE_OTHER): Payer: BLUE CROSS/BLUE SHIELD

## 2018-10-10 ENCOUNTER — Encounter: Payer: Self-pay | Admitting: Nurse Practitioner

## 2018-10-10 VITALS — BP 120/68 | HR 95 | Ht 64.5 in | Wt 244.0 lb

## 2018-10-10 DIAGNOSIS — G8929 Other chronic pain: Secondary | ICD-10-CM | POA: Diagnosis not present

## 2018-10-10 DIAGNOSIS — F329 Major depressive disorder, single episode, unspecified: Secondary | ICD-10-CM

## 2018-10-10 DIAGNOSIS — M25562 Pain in left knee: Secondary | ICD-10-CM

## 2018-10-10 DIAGNOSIS — F411 Generalized anxiety disorder: Secondary | ICD-10-CM

## 2018-10-10 DIAGNOSIS — I10 Essential (primary) hypertension: Secondary | ICD-10-CM | POA: Diagnosis not present

## 2018-10-10 DIAGNOSIS — M25561 Pain in right knee: Secondary | ICD-10-CM

## 2018-10-10 DIAGNOSIS — F32A Depression, unspecified: Secondary | ICD-10-CM

## 2018-10-10 DIAGNOSIS — Z794 Long term (current) use of insulin: Secondary | ICD-10-CM

## 2018-10-10 DIAGNOSIS — Z23 Encounter for immunization: Secondary | ICD-10-CM | POA: Diagnosis not present

## 2018-10-10 DIAGNOSIS — E119 Type 2 diabetes mellitus without complications: Secondary | ICD-10-CM

## 2018-10-10 LAB — COMPREHENSIVE METABOLIC PANEL
ALBUMIN: 4.2 g/dL (ref 3.5–5.2)
ALT: 24 U/L (ref 0–35)
AST: 17 U/L (ref 0–37)
Alkaline Phosphatase: 71 U/L (ref 39–117)
BUN: 22 mg/dL (ref 6–23)
CHLORIDE: 103 meq/L (ref 96–112)
CO2: 27 meq/L (ref 19–32)
CREATININE: 0.75 mg/dL (ref 0.40–1.20)
Calcium: 10.1 mg/dL (ref 8.4–10.5)
GFR: 82.75 mL/min (ref >60.00)
GLUCOSE: 122 mg/dL — AB (ref 70–99)
POTASSIUM: 3.5 meq/L (ref 3.5–5.1)
SODIUM: 139 meq/L (ref 135–145)
Total Bilirubin: 0.4 mg/dL (ref 0.2–1.2)
Total Protein: 7.7 g/dL (ref 6.0–8.3)

## 2018-10-10 LAB — CBC
HEMATOCRIT: 44.5 % (ref 36.0–46.0)
HEMOGLOBIN: 14.9 g/dL (ref 12.0–15.0)
MCHC: 33.6 g/dL (ref 30.0–36.0)
MCV: 91.9 fl (ref 78.0–100.0)
Platelets: 302 10*3/uL (ref 150.0–400.0)
RBC: 4.84 Mil/uL (ref 3.87–5.11)
RDW: 14 % (ref 11.5–15.5)
WBC: 11.4 10*3/uL — ABNORMAL HIGH (ref 4.0–10.5)

## 2018-10-10 MED ORDER — NAPROXEN 500 MG PO TABS: 500.0000 mg | ORAL_TABLET | Freq: Two times a day (BID) | ORAL | 0 refills | Status: DC

## 2018-10-10 MED ORDER — SERTRALINE HCL 100 MG PO TABS: 150.0000 mg | ORAL_TABLET | Freq: Every day | ORAL | 3 refills | Status: AC

## 2018-10-10 NOTE — Assessment & Plan Note (Addendum)
Stable Continue current medications Continue to monitor - CBC; Future - Comprehensive metabolic panel; Future

## 2018-10-10 NOTE — Progress Notes (Signed)
Destiny Tucker is a 62 y.o. female with the following history as recorded in EpicCare:  Patient Active Problem List   Diagnosis Date Noted  . Routine adult health maintenance 05/21/2017  . Obesity 12/22/2016  . Degenerative arthritis of knee 10/25/2016  . Right knee pain 05/09/2016  . Left hip pain 03/16/2016  . Left knee pain 03/16/2016  . Anterior knee pain 01/28/2016  . Trochanteric bursitis of right hip 01/28/2016  . Acute sinusitis 09/28/2015  . Carpal tunnel syndrome 09/28/2015  . Pre-employment health screening examination 05/26/2015  . Facial swelling 04/28/2015  . Muscle spasms of neck 03/09/2015  . Contusion of right hip 03/09/2015  . Bilateral lower extremity edema 03/09/2015  . Degenerative arthritis of left knee 01/26/2015  . 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 Bladder problem 08/02/2014  . Type II or unspecified type diabetes mellitus without mention of complication, uncontrolled 08/30/2010  . HYPERLIPIDEMIA 05/18/2010  . Depression 05/18/2010  . Essential hypertension 05/18/2010  . Allergic rhinitis 05/18/2010  . TOBACCO USE, QUIT 05/18/2010  . HYSTEROSCOPY, HX OF 05/18/2010  . Anxiety state 10/26/2008  . HEMORRHOIDS, INTERNAL 10/26/2008  . OSTEOPENIA 10/26/2008    Current Outpatient Medications  Medication Sig Dispense Refill  . albuterol (PROVENTIL HFA;VENTOLIN HFA) 108 (90 Base) MCG/ACT inhaler Inhale 2 puffs into the lungs every 6 (six) hours as needed for wheezing or shortness of breath. 1 Inhaler 1  . Alcohol Swabs (B-D SINGLE USE SWABS REGULAR) PADS USE AS DIRECTED TWICE DAILY 100 each 9  . Cholecalciferol 25 MCG (1000 UT) tablet Take 2,000 Units by mouth daily.    . clonazePAM (KLONOPIN) 1 MG tablet TAKE 1 TABLET BY MOUTH TWICE DAILY AS NEEDED FOR ANXIETY 60 tablet 0  . cyclobenzaprine (FLEXERIL) 5 MG tablet Take 1 tablet (5 mg total) by mouth at bedtime. 14 tablet 0  . diphenhydrAMINE (BENADRYL) 25 MG tablet Take 25 mg by mouth every 6  (six) hours as needed.    . hydrochlorothiazide (HYDRODIURIL) 12.5 MG tablet Take 1 tablet (12.5 mg total) by mouth daily. 90 tablet 2  . Insulin Pen Needle (NOVOFINE) 32G X 6 MM MISC Use to inject 2 times per day 300 each 2  . Insulin Pen Needle 31G X 8 MM MISC by Does not apply route.    . insulin regular human CONCENTRATED (HUMULIN R U-500 KWIKPEN) 500 UNIT/ML kwikpen INJECT 45 UNITS SUBCUTANEOUSLY ONCE DAILY IN THE MORNING AND 55 UNITS ONCE DAILY IN THE EVENING 6 mL 1  . Insulin Syringe-Needle U-100 (B-D INSULIN SYRINGE) 31G X 5/16" 0.3 ML MISC Use 2 syringes daily with Levemir Insulin 100 each 3  . INVOKANA 100 MG TABS tablet TAKE 1 TABLET BY MOUTH ONCE DAILY BEFORE BREAKFAST 90 tablet 1  . losartan (COZAAR) 50 MG tablet Take 1 tablet (50 mg total) by mouth daily. Needs appointment. 90 tablet 0  . lovastatin (MEVACOR) 20 MG tablet Take 1 tablet (20 mg total) by mouth daily. Needs appointment. 90 tablet 0  . metFORMIN (GLUCOPHAGE) 1000 MG tablet TAKE ONE TABLET BY MOUTH TWICE DAILY WITH FOOD (Patient taking differently: TAKE ONE TABLET BY MOUTH DAILY WITH FOOD) 180 tablet 2  . naproxen (NAPROSYN) 500 MG tablet Take 1 tablet (500 mg total) by mouth 2 (two) times daily with a meal. 30 tablet 0  . sertraline (ZOLOFT) 100 MG tablet Take 1.5 tablets (150 mg total) by mouth daily. 135 tablet 3  . triamcinolone ointment (KENALOG) 0.1 % APPLY OINTMENT EXTERNALLY TO AFFECTED  AREA TWICE DAILY FOR 14 DAYS  0   No current facility-administered medications for this visit.     Allergies: Codeine  Past Medical History:  Diagnosis Date  . ALLERGIC RHINITIS   . ANXIETY   . DEPRESSION   . Diabetes mellitus ([place])   . Genital herpes   . HYPERLIPIDEMIA   . HYPERTENSION   . Internal hemorrhoids without mention of complication 97/98/9211   Colonoscopy--Dr. Fuller Plan , pts. states hemorrhoids are uncomfortable  . OSTEOPENIA   . TOBACCO USE, QUIT     Past Surgical History:  Procedure Laterality Date  . ANUS  SURGERY    . MOHS SURGERY     Precancerous skin lesion  . Removal of cyst & other ovary  09/2004  . Removal of fallopion tube & ovary  1986  . Unilateral Salpingo-Oophorectomy      Family History  Problem Relation Age of Onset  . Hypertension Mother   . Hyperlipidemia Mother   . Nephrolithiasis Mother   . Diabetes Mother   . Hypertension Father   . Hyperlipidemia Father   . Nephrolithiasis Father   . Diabetes Maternal Uncle     Social History   Tobacco Use  . Smoking status: Former Smoker    Last attempt to quit: 02/18/2010    Years since quitting: 8.6  . Smokeless tobacco: Former Systems developer  . 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 Alcohol use: Yes    Comment: 2x per year     Subjective:  Destiny Tucker is here today requesting refills of zoloft and naproxen. I last saw her in March, she was asked to return in 3 months for HTN follow up, did not return until today. Since our last visit, she tells me she has been able to schedule with new endocrinologist for diabetes management, seeing Dr Buddy Duty in January. She has continued on HCTZ 12.5, losartan 50 daily for HTN, no noted adverse medication effects. No headaches, vision changes, chest pain, shortness of breath, edema.  BP Readings from Last 3 Encounters:  10/10/18 120/68  03/27/18 130/86  02/07/18 118/76   She is maintained on zoloft 150 daily, klonopin 1mg  BID for anxiety, depression, reports taking both meds daily as instructed without noted adverse effects and good control of mood.  She has been taking naproxen prn for knee pain, takes 2-3 times per week, sometimes less, with good control of her pain.  ROS- See HPI  Objective:  Vitals:   10/10/18 1424  BP: 120/68  Pulse: 95  SpO2: 98%  Weight: 244 lb (110.7 kg)  Height: 5' 4.5" (1.638 m)    General: Well developed, well nourished, in no acute distress  Skin : Warm and dry.  Head:  Normocephalic and atraumatic  Eyes: Sclera and conjunctiva clear; pupils round and reactive to light; extraocular movements intact  Oropharynx: Pink, supple. No suspicious lesions  Neck: Supple Lungs: Respirations unlabored; clear to auscultation bilaterally  CVS exam: normal rate and regular rhythm, S1 and S2 normal.  Musculoskeletal: No deformities  Extremities: No edema, cyanosis Vessels: Symmetric bilaterally  Neurologic: Alert and oriented; speech intact; face symmetrical; moves all extremities well; CNII-XII intact without focal deficit  Psychiatric: Normal mood and affect.  Assessment:  1. Depression   2. Chronic pain of both knees   3. Need for influenza vaccination   4. Essential hypertension     Plan:   Return in about 6 months (around 04/10/2019) for routine  follow up.  Orders Placed This Encounter  Procedures  . Flu Vaccine QUAD 36+ mos IM  . CBC    Standing Status:   Future    Number of Occurrences:   1    Standing Expiration Date:   10/11/2019  . Comprehensive metabolic panel    Standing Status:   Future    Number of Occurrences:   1    Standing Expiration Date:   10/11/2019    Requested Prescriptions   Signed Prescriptions Disp Refills  . sertraline (ZOLOFT) 100 MG tablet 135 tablet 3    Sig: Take 1.5 tablets (150 mg total) by mouth daily.  . naproxen (NAPROSYN) 500 MG tablet 30 tablet 0    Sig: Take 1 tablet (500 mg total) by mouth 2 (two) times daily with a meal.    Reviewed health maintenance: Annual ophthalmology exam up to date per patient, records request sent to dr Delman Cheadle Need for influenza vaccination- Flu Vaccine QUAD 36+ mos IM

## 2018-10-10 NOTE — Patient Instructions (Addendum)
Head downstairs for lab work  Ill see you in 6 months

## 2018-10-10 NOTE — Assessment & Plan Note (Signed)
Stable Continue current medications F/U for new, worsening symptoms - sertraline (ZOLOFT) 100 MG tablet; Take 1.5 tablets (150 mg total) by mouth daily.  Dispense: 135 tablet; Refill: 3 - CBC; Future - Comprehensive metabolic panel; Future

## 2018-10-10 NOTE — Assessment & Plan Note (Signed)
Continue with scheduled endocrinology appointment

## 2018-10-10 NOTE — Assessment & Plan Note (Signed)
Continue naproxen prn only, use lowest dose for shortest course to provide pain relief- dosing, side effects discussed Update labs - naproxen (NAPROSYN) 500 MG tablet; Take 1 tablet (500 mg total) by mouth 2 (two) times daily with a meal.  Dispense: 30 tablet; Refill: 0 - CBC; Future - Comprehensive metabolic panel; Future

## 2018-10-11 ENCOUNTER — Encounter: Payer: Self-pay | Admitting: Nurse Practitioner

## 2018-10-14 ENCOUNTER — Other Ambulatory Visit: Payer: Self-pay | Admitting: Nurse Practitioner

## 2018-10-14 DIAGNOSIS — R899 Unspecified abnormal finding in specimens from other organs, systems and tissues: Secondary | ICD-10-CM

## 2018-10-24 ENCOUNTER — Other Ambulatory Visit: Payer: Self-pay | Admitting: *Deleted

## 2018-10-24 NOTE — Telephone Encounter (Signed)
[place] Controlled Database Checked Last filled: 09/20/18 # 60 LOV w/you: 10/10/18 Next appt w/you: 10/30/18

## 2018-10-25 ENCOUNTER — Other Ambulatory Visit: Payer: Self-pay | Admitting: Nurse Practitioner

## 2018-10-25 DIAGNOSIS — I10 Essential (primary) hypertension: Secondary | ICD-10-CM

## 2018-10-25 MED ORDER — CLONAZEPAM 1 MG PO TABS: 1.0000 mg | ORAL_TABLET | Freq: Two times a day (BID) | ORAL | 0 refills | Status: DC | PRN

## 2018-10-29 ENCOUNTER — Other Ambulatory Visit: Payer: Self-pay | Admitting: *Deleted

## 2018-10-29 DIAGNOSIS — I10 Essential (primary) hypertension: Secondary | ICD-10-CM

## 2018-10-29 MED ORDER — LOSARTAN POTASSIUM 50 MG PO TABS: 50.0000 mg | ORAL_TABLET | Freq: Every day | ORAL | 0 refills | Status: AC

## 2018-10-30 ENCOUNTER — Ambulatory Visit: Payer: Self-pay | Admitting: Neurology

## 2018-11-06 ENCOUNTER — Other Ambulatory Visit: Payer: Self-pay | Admitting: Endocrinology

## 2018-11-11 ENCOUNTER — Telehealth: Payer: Self-pay | Admitting: *Deleted

## 2018-11-11 DIAGNOSIS — Z794 Long term (current) use of insulin: Principal | ICD-10-CM

## 2018-11-11 DIAGNOSIS — E119 Type 2 diabetes mellitus without complications: Secondary | ICD-10-CM

## 2018-11-11 NOTE — Telephone Encounter (Signed)
Copied from [place] [id number]. Topic: Referral - Request for Referral >> Nov 11, 2018 12:21 PM Marin Olp L wrote: Has patient seen PCP for this complaint? Yes.   *If NO, is insurance requiring patient see PCP for this issue before PCP can refer them? Referral for which specialty: ENDOCRINOLOGY Preferred provider/office: DR. Lorne Skeens Reason for referral: DIABETES TYPE II

## 2018-11-11 NOTE — Telephone Encounter (Signed)
Referral placed.

## 2018-11-16 ENCOUNTER — Other Ambulatory Visit: Payer: Self-pay | Admitting: Nurse Practitioner

## 2018-11-22 ENCOUNTER — Telehealth: Payer: Self-pay | Admitting: Nurse Practitioner

## 2018-11-22 NOTE — Telephone Encounter (Signed)
Referral was faxed today and left detailed msg on pt's vm

## 2018-11-22 NOTE — Telephone Encounter (Signed)
Copied from [place] [id number]. Topic: Quick Communication - See Telephone Encounter >> Nov 22, 2018  2:02 PM Blase Mess A wrote: CRM for notification. See Telephone encounter for: 11/22/18.  Patient is calling back regarding the referral to Dr. Legrand Como [place]. I advised the patient that the referral was placed. The patient states that Dr. Elijio Miles [place] office does not have the referral. Can the referral be resent please? [phone]   Please advise with the patient.

## 2018-12-17 ENCOUNTER — Other Ambulatory Visit: Payer: Self-pay | Admitting: Nurse Practitioner

## 2018-12-17 NOTE — Telephone Encounter (Signed)
[place] Controlled Database Checked Last filled: 10/25/18 # 60 LOV w/you: 10/10/18 Next appt w/you: None

## 2019-01-16 ENCOUNTER — Other Ambulatory Visit: Payer: Self-pay | Admitting: Nurse Practitioner

## 2019-01-23 ENCOUNTER — Other Ambulatory Visit: Payer: Self-pay | Admitting: Nurse Practitioner

## 2019-01-24 NOTE — Telephone Encounter (Signed)
[place] Controlled Database Checked Last filled: 12/18/18 # 60 LOV w/you: 10/10/18 Next appt w/you: None

## 2019-05-12 ENCOUNTER — Other Ambulatory Visit: Payer: Self-pay | Admitting: *Deleted

## 2019-05-12 MED ORDER — HUMULIN R U-500 KWIKPEN 500 UNIT/ML ~~LOC~~ SOPN: PEN_INJECTOR | SUBCUTANEOUS | 1 refills | Status: DC

## 2019-09-04 DIAGNOSIS — L578 Other skin changes due to chronic exposure to nonionizing radiation: Secondary | ICD-10-CM | POA: Insufficient documentation

## 2019-09-04 DIAGNOSIS — L570 Actinic keratosis: Secondary | ICD-10-CM | POA: Insufficient documentation

## 2019-09-04 DIAGNOSIS — Z85828 Personal history of other malignant neoplasm of skin: Secondary | ICD-10-CM | POA: Insufficient documentation

## 2019-12-22 DIAGNOSIS — N3010 Interstitial cystitis (chronic) without hematuria: Secondary | ICD-10-CM | POA: Insufficient documentation

## 2019-12-22 DIAGNOSIS — A6000 Herpesviral infection of urogenital system, unspecified: Secondary | ICD-10-CM | POA: Insufficient documentation

## 2019-12-22 DIAGNOSIS — R87619 Unspecified abnormal cytological findings in specimens from cervix uteri: Secondary | ICD-10-CM | POA: Insufficient documentation

## 2020-01-21 DIAGNOSIS — M8580 Other specified disorders of bone density and structure, unspecified site: Secondary | ICD-10-CM | POA: Insufficient documentation

## 2020-01-21 DIAGNOSIS — M1990 Unspecified osteoarthritis, unspecified site: Secondary | ICD-10-CM | POA: Insufficient documentation

## 2020-01-27 ENCOUNTER — Ambulatory Visit: Payer: BLUE CROSS/BLUE SHIELD | Admitting: Family

## 2020-02-09 ENCOUNTER — Ambulatory Visit: Payer: BLUE CROSS/BLUE SHIELD | Admitting: Family Medicine

## 2020-02-12 ENCOUNTER — Other Ambulatory Visit: Payer: Self-pay | Admitting: General Practice

## 2020-02-12 NOTE — Patient Outreach (Signed)
Client is newly enrolled in the Special Needs Plan program with Type II Diabetes. No recent Hgb A1C results found in the medical records. No Health Risk Assessment on file, Individualized Care Plan (ICP) completed from information in the EMR. Client also has a history of Hypertension, Hyperlipidemia and Depression/Anxiety. Will send introductory letter with ICP to the primary provider and client, along with educational materials. No ED or acute inpatient admissions in 2020, assigned RN Care Coordinator will follow up within 3 months.

## 2020-02-16 ENCOUNTER — Other Ambulatory Visit: Payer: Self-pay

## 2020-02-16 ENCOUNTER — Ambulatory Visit (INDEPENDENT_AMBULATORY_CARE_PROVIDER_SITE_OTHER): Payer: HMO

## 2020-02-16 ENCOUNTER — Ambulatory Visit: Payer: Self-pay

## 2020-02-16 ENCOUNTER — Encounter: Payer: Self-pay | Admitting: Orthopedic Surgery

## 2020-02-16 ENCOUNTER — Ambulatory Visit (INDEPENDENT_AMBULATORY_CARE_PROVIDER_SITE_OTHER): Payer: HMO | Admitting: Orthopedic Surgery

## 2020-02-16 VITALS — Ht 63.5 in | Wt 252.0 lb

## 2020-02-16 DIAGNOSIS — M170 Bilateral primary osteoarthritis of knee: Secondary | ICD-10-CM

## 2020-02-16 DIAGNOSIS — M25562 Pain in left knee: Secondary | ICD-10-CM | POA: Diagnosis not present

## 2020-02-16 DIAGNOSIS — M25561 Pain in right knee: Secondary | ICD-10-CM

## 2020-02-16 NOTE — Progress Notes (Signed)
Office Visit Note   Patient: Destiny Tucker           Date of Birth: Mar 26, 1956           MRN: [id number] Visit Date: 02/16/2020 Requested by: Lance Sell, NP No address on file PCP: Bartholome Bill, MD  Subjective: Chief Complaint  Patient presents with  . Right Knee - Pain  . Left Knee - Pain    HPI: Destiny Tucker is a patient with bilateral knee pain right worse than left.  Been going on for several years.  Reports swelling and grinding as well as pain which is worse with weightbearing.  Denies much in the way of mechanical symptoms.  She had to stop water therapy due to Covid.  Tried Duexis but did not help much.  She is currently on naproxen.  Does have a history of both cortisone and gel injections which have not given her much relief.  Does have a history of insulin-dependent diabetes.  BMI 43.              ROS: All systems reviewed are negative as they relate to the chief complaint within the history of present illness.  Patient denies  fevers or chills.   Assessment & Plan: Visit Diagnoses:  1. Pain in both knees, unspecified chronicity   2. Primary osteoarthritis of both knees     Plan: Impression is bilateral knee pain and arthritis with varus alignment.  Right is worse than the left.  Plan is observation.  Shots are not helping much.  Nonweightbearing exercises would be advisable.  She already has a fairly significant flexion contracture on the right knee.  She is in a need to lose about 25 to 30 pounds to get to a BMI which is acceptable for surgery.  Hemoglobin A1c is in the 6 range which is very good.  Follow-Up Instructions: Return if symptoms worsen or fail to improve.   Orders:  Orders Placed This Encounter  Procedures  . XR KNEE 3 VIEW RIGHT  . XR Knee 1-2 Views Left   No orders of the defined types were placed in this encounter.     Procedures: No procedures performed   Clinical Data: No additional findings.  Objective: Vital Signs: Ht 5'  3.5" (1.613 m)   Wt 252 lb (114.3 kg)   BMI 43.94 kg/m   Physical Exam:   Constitutional: Patient appears well-developed HEENT:  Head: Normocephalic Eyes:EOM are normal Neck: Normal range of motion Cardiovascular: Normal rate Pulmonary/chest: Effort normal Neurologic: Patient is alert Skin: Skin is warm Psychiatric: Patient has normal mood and affect    Ortho Exam: Ortho exam demonstrates full active and passive range of motion of the ankles with no significant groin pain with internal or external rotation of the hips.  Right knee has 12 degree flexion contracture left knee comes out straight.  Flexion is to about 90 on the right 110 on the left.  Collateral and cruciate ligaments are stable.  Tell femoral crepitus is present.  No effusion in either knee.  Specialty Comments:  No specialty comments available.  Imaging: XR Knee 1-2 Views Left  Result Date: 02/16/2020 AP lateral left knee reviewed.  No acute fracture.  Tricompartmental degenerative changes which are moderate to severe are present worse in the medial compartment.  Varus alignment present  XR KNEE 3 VIEW RIGHT  Result Date: 02/16/2020 AP lateral merchant right knee reviewed.  Tricompartmental degenerative changes noted worse in the medial compartment where  bone-on-bone changes and spurring are present.  No acute fracture.  Significant varus alignment is present.    PMFS History: Patient Active Problem List   Diagnosis Date Noted  . Routine adult health maintenance 05/21/2017  . Obesity 12/22/2016  . Degenerative arthritis of knee 10/25/2016  . Right knee pain 05/09/2016  . Chronic pain of both knees 03/16/2016  . Trochanteric bursitis of right hip 01/28/2016  . Acute sinusitis 09/28/2015  . Carpal tunnel syndrome 09/28/2015  . Pre-employment health screening examination 05/26/2015  . Muscle spasms of neck 03/09/2015  . Bilateral lower extremity edema 03/09/2015  . Degenerative arthritis of left knee  01/26/2015  . 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 Bladder problem 08/02/2014  . Diabetes ([place]) 08/30/2010  . HYPERLIPIDEMIA 05/18/2010  . Depression 05/18/2010  . Essential hypertension 05/18/2010  . Allergic rhinitis 05/18/2010  . TOBACCO USE, QUIT 05/18/2010  . HYSTEROSCOPY, HX OF 05/18/2010  . Anxiety state 10/26/2008  . HEMORRHOIDS, INTERNAL 10/26/2008  . OSTEOPENIA 10/26/2008   Past Medical History:  Diagnosis Date  . ALLERGIC RHINITIS   . ANXIETY   . DEPRESSION   . Diabetes mellitus ([place])   . Genital herpes   . HYPERLIPIDEMIA   . HYPERTENSION   . Internal hemorrhoids without mention of complication 123456   Colonoscopy--Dr. Fuller Plan , pts. states hemorrhoids are uncomfortable  . OSTEOPENIA   . TOBACCO USE, QUIT     Family History  Problem Relation Age of Onset  . Hypertension Mother   . Hyperlipidemia Mother   . Nephrolithiasis Mother   . Diabetes Mother   . Hypertension Father   . Hyperlipidemia Father   . Nephrolithiasis Father   . Diabetes Maternal Uncle     Past Surgical History:  Procedure Laterality Date  . ANUS SURGERY    . MOHS SURGERY     Precancerous skin lesion  . Removal of cyst & other ovary  09/2004  . Removal of fallopion tube & ovary  1986  . Unilateral Salpingo-Oophorectomy     Social History   Occupational History  . Occupation: Retired    Fish farm manager: Doctor, [employer]  Tobacco Use  . Smoking status: Former Smoker    Quit date: 02/18/2010    Years since quitting: 10.0  . Smokeless tobacco: Former Systems developer  . Tobacco comment: Married, lives with spouse. 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 Alcohol use: Yes    Comment: 2x per year  . Drug use: No  . Sexual activity: Not on file

## 2020-02-28 ENCOUNTER — Encounter: Payer: Self-pay | Admitting: Emergency Medicine

## 2020-02-28 ENCOUNTER — Ambulatory Visit (INDEPENDENT_AMBULATORY_CARE_PROVIDER_SITE_OTHER): Payer: HMO

## 2020-02-28 ENCOUNTER — Ambulatory Visit
Admission: EM | Admit: 2020-02-28 | Discharge: 2020-02-28 | Disposition: A | Discharge status: Home / Self Care | Payer: HMO | Attending: Emergency Medicine | Admitting: Emergency Medicine

## 2020-02-28 ENCOUNTER — Other Ambulatory Visit: Payer: Self-pay

## 2020-02-28 DIAGNOSIS — R0789 Other chest pain: Secondary | ICD-10-CM

## 2020-02-28 DIAGNOSIS — W19XXXA Unspecified fall, initial encounter: Secondary | ICD-10-CM

## 2020-02-28 DIAGNOSIS — W010XXA Fall on same level from slipping, tripping and stumbling without subsequent striking against object, initial encounter: Secondary | ICD-10-CM | POA: Diagnosis not present

## 2020-02-28 DIAGNOSIS — R0781 Pleurodynia: Secondary | ICD-10-CM

## 2020-02-28 MED ORDER — CYCLOBENZAPRINE HCL 5 MG PO TABS: 5.0000 mg | ORAL_TABLET | Freq: Every day | ORAL | 0 refills | Status: DC

## 2020-02-28 NOTE — Discharge Instructions (Signed)
Recommend RICE: rest, ice, compression, elevation as needed for pain.    Heat therapy (hot compress, warm wash rag, hot showers, etc.) can help relax muscles and soothe muscle aches. Cold therapy (ice packs) can be used to help swelling both after injury and after prolonged use of areas of chronic pain/aches.  For pain: recommend 350 mg-1000 mg of Tylenol (acetaminophen) and/or 200 mg - 800 mg of Advil (ibuprofen, Motrin) every 8 hours as needed.  May alternate between the two throughout the day as they are generally safe to take together.  DO NOT exceed more than 3000 mg of Tylenol or 3200 mg of ibuprofen in a 24 hour period as this could damage your stomach, kidneys, liver, or increase your bleeding risk.  May take muscle relaxer as needed for severe pain / spasm.  (This medication may cause you to become tired so it is important you do not drink alcohol or operate heavy machinery while on this medication.  Recommend your first dose to be taken before bedtime to monitor for side effects safely)  Return for worsening pain, difficulty breathing.

## 2020-02-28 NOTE — ED Provider Notes (Signed)
[facility]    CSN: [id number] Arrival date & time: 02/28/20  1536      History   Chief Complaint Chief Complaint  Patient presents with  . Fall    HPI Destiny Tucker is a 64 y.o. female with history of obesity, hypertension presenting for left lower rib cage pain.  States that she tripped and fell over groceries 2 days ago.  Denies head trauma, LOC.  Pain is worse with deep inspiration, laughing or coughing.  Denies hemoptysis.  Did have some bruising which is resolving.    Past Medical History:  Diagnosis Date  . ALLERGIC RHINITIS   . ANXIETY   . DEPRESSION   . Diabetes mellitus ([place])   . Genital herpes   . HYPERLIPIDEMIA   . HYPERTENSION   . Internal hemorrhoids without mention of complication [id number]   Colonoscopy--Dr. Fuller Plan , pts. states hemorrhoids are uncomfortable  . OSTEOPENIA   . TOBACCO USE, QUIT     Patient Active Problem List   Diagnosis Date Noted  . Routine adult health maintenance 05/21/2017  . Obesity 12/22/2016  . Degenerative arthritis of knee 10/25/2016  . Right knee pain 05/09/2016  . Chronic pain of both knees 03/16/2016  . Trochanteric bursitis of right hip 01/28/2016  . Acute sinusitis 09/28/2015  . Carpal tunnel syndrome 09/28/2015  . Pre-employment health screening examination 05/26/2015  . Muscle spasms of neck 03/09/2015  . Bilateral lower extremity edema 03/09/2015  . Degenerative arthritis of left knee 01/26/2015  . 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 Bladder problem 08/02/2014  . Diabetes ([place]) 08/30/2010  . HYPERLIPIDEMIA 05/18/2010  . Depression 05/18/2010  . Essential hypertension 05/18/2010  . Allergic rhinitis 05/18/2010  . TOBACCO USE, QUIT 05/18/2010  . HYSTEROSCOPY, HX OF 05/18/2010  . Anxiety state 10/26/2008  . HEMORRHOIDS, INTERNAL 10/26/2008  . OSTEOPENIA 10/26/2008    Past Surgical History:  Procedure Laterality Date  . ANUS SURGERY    . MOHS SURGERY     Precancerous skin lesion   . Removal of cyst & other ovary  09/2004  . Removal of fallopion tube & ovary  1986  . Unilateral Salpingo-Oophorectomy      OB History   No obstetric history on file.      Home Medications    Prior to Admission medications   Medication Sig Start Date End Date Taking? Authorizing Provider  albuterol (PROVENTIL HFA;VENTOLIN HFA) 108 (90 Base) MCG/ACT inhaler Inhale 2 puffs into the lungs every 6 (six) hours as needed for wheezing or shortness of breath. 08/19/18   Lance Sell, NP  Alcohol Swabs (B-D SINGLE USE SWABS REGULAR) PADS USE AS DIRECTED TWICE DAILY 12/01/13   Elayne Snare, MD  Cholecalciferol 25 MCG (1000 UT) tablet Take 2,000 Units by mouth daily.    [provider]  clonazePAM (KLONOPIN) 1 MG tablet Take 1 tablet by mouth twice daily as needed for anxiety 01/24/19   Lance Sell, NP  cyclobenzaprine (FLEXERIL) 5 MG tablet Take 1 tablet (5 mg total) by mouth at bedtime. 02/28/20   Hall-Potvin, [place], PA-C  diphenhydrAMINE (BENADRYL) 25 MG tablet Take 25 mg by mouth every 6 (six) hours as needed.    [provider]  hydrochlorothiazide (HYDRODIURIL) 12.5 MG tablet TAKE 1 TABLET BY MOUTH ONCE DAILY 10/25/18   Lance Sell, NP  Insulin Pen Needle (NOVOFINE) 32G X 6 MM MISC Use to inject 2 times per day 10/18/16   Elayne Snare, MD  Insulin Pen  Needle 31G X 8 MM MISC by Does not apply route.    [provider]  insulin regular human CONCENTRATED (HUMULIN R U-500 KWIKPEN) 500 UNIT/ML kwikpen INJECT 45 UNITS SUBCUTANEOUSLY IN THE MORNING AND 55 IN THE EVENING. **NEEDS VISIT FOR FUTURE REFILLS** 05/12/19   Plotnikov, Evie Lacks, MD  Insulin Syringe-Needle U-100 (B-D INSULIN SYRINGE) 31G X 5/16" 0.3 ML MISC Use 2 syringes daily with Levemir Insulin 10/17/16   Elayne Snare, MD  INVOKANA 100 MG TABS tablet TAKE 1 TABLET BY MOUTH ONCE DAILY BEFORE BREAKFAST 10/31/17   Elayne Snare, MD  losartan (COZAAR) 50 MG tablet Take 1 tablet (50 mg total) by  mouth daily. 10/29/18   Lance Sell, NP  lovastatin (MEVACOR) 20 MG tablet Take 1 tablet (20 mg total) by mouth daily with breakfast. 11/18/18   Lance Sell, NP  metFORMIN (GLUCOPHAGE) 1000 MG tablet TAKE ONE TABLET BY MOUTH TWICE DAILY WITH FOOD Patient taking differently: TAKE ONE TABLET BY MOUTH DAILY WITH FOOD 10/31/16   Elayne Snare, MD  naproxen (NAPROSYN) 500 MG tablet Take 1 tablet (500 mg total) by mouth 2 (two) times daily with a meal. 10/10/18   Lance Sell, NP  sertraline (ZOLOFT) 100 MG tablet Take 1.5 tablets (150 mg total) by mouth daily. 10/10/18   Lance Sell, NP  triamcinolone ointment (KENALOG) 0.1 % APPLY OINTMENT EXTERNALLY TO AFFECTED AREA TWICE DAILY FOR 14 DAYS 08/27/18   [provider]    Family History Family History  Problem Relation Age of Onset  . Hypertension Mother   . Hyperlipidemia Mother   . Nephrolithiasis Mother   . Diabetes Mother   . Hypertension Father   . Hyperlipidemia Father   . Nephrolithiasis Father   . Diabetes Maternal Uncle     Social History Social History   Tobacco Use  . Smoking status: Former Smoker    Quit date: 02/18/2010    Years since quitting: 10.0  . Smokeless tobacco: Former Systems developer  . 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 Alcohol use: Yes    Comment: 2x per year  . Drug use: No     Allergies   Codeine   Review of Systems As per HPI   Physical Exam Triage Vital Signs ED Triage Vitals  Enc Vitals Group     BP 02/28/20 1553 (!) 156/69     Pulse Rate 02/28/20 1553 94     Resp 02/28/20 1553 18     Temp 02/28/20 1553 98 F (36.7 C)     Temp Source 02/28/20 1553 Oral     SpO2 02/28/20 1553 95 %     Weight --      Height --      Head Circumference --      Peak Flow --      Pain Score 02/28/20 1602 7     Pain Loc --      Pain Edu? --      Excl. in [place]? --    No data  found.  Updated Vital Signs BP (!) 156/69 (BP Location: Left Arm)   Pulse 94   Temp 98 F (36.7 C) (Oral)   Resp 18   SpO2 95%   Visual Acuity Right Eye Distance:   Left Eye Distance:   Bilateral Distance:    Right Eye Near:   Left Eye Near:    Bilateral Near:     Physical Exam  Constitutional:      General: She is not in acute distress.    Appearance: She is obese.  HENT:     Head: Normocephalic and atraumatic.  Eyes:     General: No scleral icterus.    Pupils: Pupils are equal, round, and reactive to light.  Neck:     Comments: Trachea midline, negative JVD Cardiovascular:     Rate and Rhythm: Normal rate and regular rhythm.     Pulses: Normal pulses.     Heart sounds: Normal heart sounds.  Pulmonary:     Effort: Pulmonary effort is normal. No respiratory distress.     Breath sounds: No stridor. No wheezing or rales.  Chest:       Comments: Patient does have scattered, small bruising that is resolving (yellow) across breasts bilaterally, mild TTP Musculoskeletal:     Cervical back: Normal range of motion. No rigidity or tenderness.  Lymphadenopathy:     Cervical: No cervical adenopathy.  Skin:    Coloration: Skin is not jaundiced or pale.  Neurological:     Mental Status: She is alert and oriented to person, place, and time.      UC Treatments / Results  Labs (all labs ordered are listed, but only abnormal results are displayed) Labs Reviewed - No data to display  EKG   Radiology DG Ribs Unilateral W/Chest Left  Result Date: 02/28/2020 CLINICAL DATA:  Fall 2 days ago, left lower anterior rib pain EXAM: LEFT RIBS AND CHEST - 3+ VIEW COMPARISON:  Left rib radiographs dated 06/13/2012 FINDINGS: Lungs are clear.  No pleural effusion or pneumothorax. The heart is normal in size. No displaced left rib fracture is seen. Old left lateral 6th rib fracture deformity, chronic. IMPRESSION: No evidence of acute cardiopulmonary disease. No displaced left rib  fracture is seen. Old left 6th rib fracture deformity. Electronically Signed   By: Julian Hy M.D.   On: 02/28/2020 16:49    Procedures Procedures (including critical care time)  Medications Ordered in UC Medications - No data to display  Initial Impression / Assessment and Plan / UC Course  I have reviewed the triage vital signs and the nursing notes.  Pertinent labs & imaging results that were available during my care of the patient were reviewed by me and considered in my medical decision making (see chart for details).     Left rib x-ray done office, reviewed by me radiology: Negative for acute fracture or displacement.  Old left sixth rib fracture deformity noted.  Reviewed findings with patient verbalized understanding.  Encourage patient to continue naproxen, will add Flexeril, which patient has tolerated well in recent history, for additional relief.  Return precautions discussed, patient verbalized understanding and is agreeable to plan. Final Clinical Impressions(s) / UC Diagnoses   Final diagnoses:  Rib pain on left side  Fall, initial encounter     Discharge Instructions     Recommend RICE: rest, ice, compression, elevation as needed for pain.    Heat therapy (hot compress, warm wash rag, hot showers, etc.) can help relax muscles and soothe muscle aches. Cold therapy (ice packs) can be used to help swelling both after injury and after prolonged use of areas of chronic pain/aches.  For pain: recommend 350 mg-1000 mg of Tylenol (acetaminophen) and/or 200 mg - 800 mg of Advil (ibuprofen, Motrin) every 8 hours as needed.  May alternate between the two throughout the day as they are generally safe to take together.  DO NOT exceed  more than 3000 mg of Tylenol or 3200 mg of ibuprofen in a 24 hour period as this could damage your stomach, kidneys, liver, or increase your bleeding risk.  May take muscle relaxer as needed for severe pain / spasm.  (This medication may cause  you to become tired so it is important you do not drink alcohol or operate heavy machinery while on this medication.  Recommend your first dose to be taken before bedtime to monitor for side effects safely)  Return for worsening pain, difficulty breathing.    ED Prescriptions    Medication Sig Dispense Auth. Provider   cyclobenzaprine (FLEXERIL) 5 MG tablet Take 1 tablet (5 mg total) by mouth at bedtime. 14 tablet Hall-Potvin, [place], PA-C     I have reviewed the PDMP during this encounter.   Hall-Potvin, [place], [place] 02/29/20 [phone]

## 2020-02-28 NOTE — ED Triage Notes (Signed)
Tripped over groceries 2 days ago.  Patient has bruising to breasts and complains of pain to left upper abdomen/lower rib cage.  Pain with movement.  Denies any breathing difficulty.  Denies productive cough, denies nausea or vomiting.

## 2020-03-01 ENCOUNTER — Other Ambulatory Visit: Payer: Self-pay

## 2020-03-01 NOTE — Patient Outreach (Addendum)
  [place] [facility]) Care Management Chronic Special Needs Program  03/01/2020  Name: CHEREESE BATTENFIELD DOB: Mar 24, 1956  MRN: [id number]  Ms. Tempa Wittkop is enrolled in a chronic special needs plan for Diabetes. Chronic Care Management Coordinator telephoned client to complete health risk assessment and to develop individualized care plan and follow up on 24 hour nurse call line referral. HIPAA verified by with client.  Client states she took a muscle relaxer last evening due to the recent fall and injury she had to her breast area. Client reports being seen at the urgent care on 02/28/20.  She reports she is feeling better today and states she is currently trying to get things done at home.  Client requested RNCM contact phone number to call back at another time to complete initial assessment. RNCM provided contact phone number.    Subjective: RNCM will await call back from client to complete assessment. If no return call will attempt 2nd telephone outreach call to client within 1 week.   Quinn Plowman RN,BSN,CCM [facility] [phone]

## 2020-03-03 ENCOUNTER — Ambulatory Visit: Payer: HMO | Admitting: Family

## 2020-03-08 ENCOUNTER — Other Ambulatory Visit: Payer: Self-pay

## 2020-03-08 NOTE — Patient Outreach (Signed)
  [place] [facility]) Care Management Chronic Special Needs Program    03/08/2020  Name: NATAJIA SUDDRETH, DOB: [birth year]  MRN: [id number]   Ms. Anisten Gulyas is enrolled in a chronic special needs plan for Diabetes. Second telephone call to client to complete initial / health risk assessment.  Unable to reach. HIPAA compliant voice message left with call back phone number.   PLAN; RNCM will attempt 3rd telephone outreach to client within 1 week.   Quinn Plowman RN,BSN,CCM [place] [facility] [phone] '

## 2020-03-09 ENCOUNTER — Other Ambulatory Visit: Payer: Self-pay

## 2020-03-09 NOTE — Patient Outreach (Signed)
[place] [facility]) Care Management Chronic Special Needs Program  03/09/2020  Name: Destiny Tucker DOB: 02-20-1956  MRN: [id number]  Ms. Nuala Woehrle is enrolled in a chronic special needs plan for Diabetes. Chronic Care Management Coordinator telephoned client to review health risk assessment and to develop individualized care plan. Telephone call to client. Unable to reach. HIPAA compliant voice message left with call back phone number and return call request.   Subjective:  Goals Addressed            This Visit's Progress   . Client understands the importance of follow-up with providers by attending scheduled visits       Most recent primary care provider visit: 10/04/19 and 07/24/19 Continue to maintain and keep follow up visits with your providers.      . Client will report improved coping with a health condition by discussing with providers and RN Care Coordinator.       RN provided [employer] on "Coping with a Health Condition: Signs of Depression". Continue to take medications as prescribed.  Contact your doctor if you have questions.    [name] HEMOGLOBIN A1C < 7.0       Most recent Hgb A1c  6.4 completed on 02/16/20 It is important to have your Hemoglobin A1C checked every 6 months if you are at goal and every 3 months if you are not at goal. Please discuss with your provider at the next office visit.     . Maintain timely refills of diabetic medication as prescribed within the year .       Review of medical record indicates client maintains timely refills of diabetic medications. Take medications as prescribed.  Follow up with your doctor if you have questions Contact your assigned RN case manager [phone]) if you have difficulty obtaining your medications.     . Obtain Annual Foot Exam       No recent diabetic foot exam noted.  Diabetes foot care - Check feet daily at home (look for skin color changes, cuts, sores or cracks in the skin, swelling of feet  or ankles, ingrown or fungal toenails, corn or calluses). Report these findings to your doctor - Wash feet with soap and water, dry feet well especially between toes - Moisturize your feet but not between the toes - Always wear shoes that protect your whole feet.      . Obtain annual screen for micro albuminuria (urine) , nephropathy (kidney problems)       MIcro Albuminuria completed 08/06/19 Attend yearly physicals and follow up visits with your providers and complete labs as recommended.     Illa Level Hemoglobin A1C at least 2 times per year       Hgb A1C completed 08/05/19 and 02/16/20 Keep your follow up appointments with your provider and have lab work completed as recommended.     . Visit Primary Care Provider or Endocrinologist at least 2 times per year        Most recent primary care provider visit 10/04/19 and 07/23/20 Most recent endocrinologist 08/10/20 and 05/19/20 Continue to see your primary care provider and endocrinologist as recommended.      Assessment: Client is meeting diabetes self-management goal of hemoglobin A1C of <7.0% with most recent reading of 6.4% on 02/16/20 without reports of hypoglycemia .  Plan:  Send successful outreach letter to client and primary care provider with individualized care plan   Chronic care management coordination will outreach in:  3 Months  Quinn Plowman RN,BSN,CCM Chronic Care Management Coordinator [place] [facility] [place] Case Manager, C-SNP

## 2020-03-16 ENCOUNTER — Ambulatory Visit: Payer: HMO | Admitting: Primary Care

## 2020-03-17 ENCOUNTER — Ambulatory Visit: Payer: Self-pay

## 2020-04-21 ENCOUNTER — Ambulatory Visit: Payer: HMO | Admitting: *Deleted

## 2020-05-26 ENCOUNTER — Other Ambulatory Visit: Payer: Self-pay

## 2020-05-26 NOTE — Patient Outreach (Signed)
  [place] [facility]) Care Management Chronic Special Needs Program    05/26/2020  Name: Destiny Tucker, DOB: November 26, 1955  MRN: [id number]   Ms. Destiny Tucker is enrolled in a chronic special needs plan for Diabetes. Telephone call to client for Health risk assessment review/ initial assessment outreach. Unable to reach client. HIPAA compliant voice message left with call back phone number.   PLAN:  RNCM will attempt 2nd telephone call to client in 1 month.   Quinn Plowman RN,BSN,CCM [place] [facility] [phone]

## 2020-06-18 ENCOUNTER — Ambulatory Visit: Payer: Self-pay

## 2020-06-23 ENCOUNTER — Other Ambulatory Visit: Payer: Self-pay

## 2020-06-23 NOTE — Patient Outreach (Signed)
  [place] [facility]) Care Management Chronic Special Needs Program  06/23/2020  Name: Destiny Tucker DOB: February 11, 1956  MRN: [id number]  Destiny Tucker is enrolled in a chronic special needs plan for Diabetes. Chronic Care Management Coordinator telephoned client to review health risk assessment and to develop individualized care plan. Unable to reach client. HIPAA compliant voice message left with call back phone number.  Goals Addressed   None     Plan: RNCM will attempt 3rd telephone call to client in 1 week.    Quinn Plowman RN,BSN,CCM [place] [facility] [phone]

## 2020-06-29 ENCOUNTER — Other Ambulatory Visit: Payer: Self-pay

## 2020-06-29 NOTE — Patient Outreach (Addendum)
[facility]) Care Management Chronic Special Needs Program  06/29/2020  Name: MITALI SHENEFIELD DOB: 28-Oct-1956  MRN: [id number]  Ms. Snigdha Howser is enrolled in a chronic special needs plan for Diabetes. Telephone call to client for CSNP assessment update.  Unable to reach. HIPAA compliant voice message left with call back phone number.  Individualized care plan updated based on available information.    Goals Addressed            This Visit's Progress   .  Acknowledge receipt of Advanced Directive package       RN case manager will send client Advance Directive packet. Contact your case manager if you have questions     . Client understands the importance of follow-up with providers by attending scheduled visits   On track    Most recent primary care provider visit: 10/04/19 and  07/29/2019 Continue to maintain and keep follow up visits with your providers.      . Client will report improved coping with a health condition by discussing with providers and RN Care Coordinator.   On track    RN provided Emmi Education: Helping you Cope.  Continue to take medications as prescribed.  Contact your doctor if you have questions.    . Client will verbalize knowledge of self management of Hypertension as evidences by BP reading of 140/90 or less; or as defined by provider   On track    Plan to eat low salt and heart healthy  meals full of fruits, vegetables, whole grains, lean protein and limit fat and sugars.  RN case manager will send client education article: Controlling your blood pressure through lifestyle.       [name] HEMOGLOBIN A1C < 7       Congratulations your Hgb A1c is below goal at  6.2 on 05/19/20 Review [employer] calendar sent in the mail for Diabetes Action plan.  Increase exercise only if you are able to. Follow your doctors recommendations.        . Maintain timely refills of diabetic medication as prescribed within the year .   On track     Contact your RN case manager if you have questions about your medications.  Continue to take your medications as prescribe Contact your doctor if you have questions.      . Obtain annual  Lipid Profile, LDL-C   On track    RN case manager will send client education article:  Low cholesterol, saturated fat and trans fat diet.  Try to avoid saturated fats, trans-fats and eat more fiber, plan to take statin as ordered.     Illa Level Annual Eye (retinal)  Exam    On track    Review of medical record indicates your last eye exam was January 2021.  Diabetes can affect your vision.  Plan to have a dilated eye exam every year.     . Obtain Annual Foot Exam   Not on track    Last foot exam noted was 03/27/17.  It is important to have your feet examined regularly by your doctor. Discuss having an annual foot exam at your next visit with your doctor.       . Obtain annual screen for micro albuminuria (urine) , nephropathy (kidney problems)   On track    MIcro Albuminuria completed 08/06/19 Diabetes can affect your kidneys.  It is important that your doctor check your urine at least once a year.  These tests show how  your kidney's are working.     . COMPLETED: Obtain Hemoglobin A1C at least 2 times per year   On track    Hgb A1C completed 02/16/20 and 05/19/20 Keep your follow up appointments with your provider and have lab work completed as recommended.     . Visit Primary Care Provider or Endocrinologist at least 2 times per year    On track    Most recent primary care provider visit 10/04/19 and 07/23/20 Most recent endocrinologist 02/10/20 and 05/19/20 Continue to see your primary care provider and endocrinologist as recommended.        Plan:  Send successful outreach letter with a copy of their individualized care plan, Send individual care plan to provider and Send educational material  RNCM will send client Advance Directive.   Chronic care management coordinator will outreach in:  12  months    Quinn Plowman RN,BSN,CCM [place] [facility] [phone]    .

## 2020-06-30 ENCOUNTER — Ambulatory Visit: Payer: Self-pay

## 2020-08-02 ENCOUNTER — Other Ambulatory Visit: Payer: Self-pay

## 2020-08-02 ENCOUNTER — Ambulatory Visit (INDEPENDENT_AMBULATORY_CARE_PROVIDER_SITE_OTHER): Payer: HMO | Admitting: Nurse Practitioner

## 2020-08-02 ENCOUNTER — Ambulatory Visit: Payer: HMO | Admitting: Nurse Practitioner

## 2020-08-02 ENCOUNTER — Encounter: Payer: Self-pay | Admitting: Nurse Practitioner

## 2020-08-02 VITALS — BP 111/79 | HR 86 | Temp 98.0°F | Ht 63.5 in | Wt 246.0 lb

## 2020-08-02 DIAGNOSIS — I10 Essential (primary) hypertension: Secondary | ICD-10-CM | POA: Diagnosis not present

## 2020-08-02 DIAGNOSIS — F339 Major depressive disorder, recurrent, unspecified: Secondary | ICD-10-CM | POA: Diagnosis not present

## 2020-08-02 DIAGNOSIS — E119 Type 2 diabetes mellitus without complications: Secondary | ICD-10-CM

## 2020-08-02 DIAGNOSIS — Z6841 Body Mass Index (BMI) 40.0 and over, adult: Secondary | ICD-10-CM

## 2020-08-02 DIAGNOSIS — Z794 Long term (current) use of insulin: Secondary | ICD-10-CM

## 2020-08-02 DIAGNOSIS — F5105 Insomnia due to other mental disorder: Secondary | ICD-10-CM

## 2020-08-02 DIAGNOSIS — E785 Hyperlipidemia, unspecified: Secondary | ICD-10-CM

## 2020-08-02 DIAGNOSIS — F99 Mental disorder, not otherwise specified: Secondary | ICD-10-CM

## 2020-08-02 MED ORDER — CLONAZEPAM 1 MG PO TABS: 1.0000 mg | ORAL_TABLET | Freq: Every day | ORAL | 0 refills | Status: AC

## 2020-08-02 MED ORDER — DUEXIS 800-26.6 MG PO TABS: 1.0000 | ORAL_TABLET | Freq: Three times a day (TID) | ORAL | 0 refills | Status: AC | PRN

## 2020-08-02 NOTE — Progress Notes (Signed)
Destiny Tucker, [place]  [address] Phone:  [phone]   Fax:  [phone]   New Patient Office Visit  Subjective:  Patient ID: Destiny Tucker, female    DOB: 1956-09-19  Age: 64 y.o. MRN: [id number]  CC:  Chief Complaint  Patient presents with   Establish Care    patient wants med refills. meds that have been managed by other MD.    Didn't care for past MD practice    HPI Destiny Tucker presents for establish care. She  has a past medical history of ALLERGIC RHINITIS, ANXIETY, DEPRESSION, Diabetes mellitus ([place]), Genital herpes, HYPERLIPIDEMIA, HYPERTENSION, Internal hemorrhoids without mention of complication (72/62/0355), OSTEOPENIA, and TOBACCO USE, QUIT.   She is being followed by Destiny Reedy NP for he DM. She is control. She is also a member of a study for which she gets full annual exams.    She has been seen by  Dr Luciana Axe at [facility] in the past.   She is wanting refill on her Clonazepam. She has insomnia. She uses them at night. She has situational anxiety with flights .[name] "She does not take advantage of it." She had it it filled last on 06/07/20. She has not tried She flies once or twice per yr. She only uses them prn. She is caring for her husband. She is caring for him. She has some resentment. He does have children but she does not have any. [place] are at the beach.  She admits that she has been on sertraline 100 mg. She was adamant that "CDC and other should be aware to the stress that many are under during this time."  She has a chemical imbalance. She does not feel like the counselors at [facility] were effecitve.   She has been to orthopedic. She is in need of a  knee replacement however due to her current it is not an option.   Past Medical History:  Diagnosis Date   ALLERGIC RHINITIS    ANXIETY    DEPRESSION    Diabetes mellitus ([place])    Genital herpes    HYPERLIPIDEMIA    HYPERTENSION    Internal  hemorrhoids without mention of complication 97/41/6384   Colonoscopy--Dr. Fuller Plan , pts. states hemorrhoids are uncomfortable   OSTEOPENIA    TOBACCO USE, QUIT     Past Surgical History:  Procedure Laterality Date   ANUS SURGERY     MOHS SURGERY     Precancerous skin lesion   Removal of cyst & other ovary  09/2004   Removal of fallopion tube & ovary  1986   Unilateral Salpingo-Oophorectomy      Family History  Problem Relation Age of Onset   Hypertension Mother    Hyperlipidemia Mother    Nephrolithiasis Mother    Diabetes Mother    Hypertension Father    Hyperlipidemia Father    Nephrolithiasis Father    Diabetes Maternal Uncle     Social History   Socioeconomic History   Marital status: Married    Spouse name: Not on file   Number of children: Not on file   Years of education: Not on file   Highest education level: Not on file  Occupational History   Occupation: Retired    Fish farm manager: [employer]  Tobacco Use   Smoking status: Former Smoker    Quit date: 02/18/2010    Years since quitting: 10.4   Smokeless tobacco: Former Systems developer  Tobacco comment: Married, lives with spouse. 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  Vaping Use   Vaping Use: Never used  Substance and Sexual Activity   Alcohol use: Yes    Comment: 2x per year   Drug use: No   Sexual activity: Not on file  Other Topics Concern   Not on file  Social History Narrative   Not on file   Social Determinants of Health   Financial Resource Strain:    Difficulty of Paying Living Expenses: Not on file  Food Insecurity:    Worried About [place] in the Last Year: Not on file   Ran Out of Food in the Last Year: Not on file  Transportation Needs:    Lack of Transportation (Medical): Not on file   Lack of Transportation (Non-Medical): Not on file  Physical Activity:    Days of Exercise per Week: Not on file   Minutes of Exercise per  Session: Not on file  Stress:    Feeling of Stress : Not on file  Social Connections:    Frequency of Communication with Friends and Family: Not on file   Frequency of Social Gatherings with Friends and Family: Not on file   Attends Religious Services: Not on file   Active Member of Clubs or Organizations: Not on file   Attends Archivist Meetings: Not on file   Marital Status: Not on file  Intimate Partner Violence:    Fear of Current or Ex-Partner: Not on file   Emotionally Abused: Not on file   Physically Abused: Not on file   Sexually Abused: Not on file    ROS Review of Systems  Objective:   Today's Vitals: BP 111/79    Pulse 86    Temp 98 F (36.7 C) (Temporal)    Ht 5' 3.5" (1.613 m)    Wt 246 lb (111.6 kg)    SpO2 97%    BMI 42.89 kg/m   Physical Exam Constitutional:      General: She is not in acute distress.    Appearance: She is obese. She is not ill-appearing, toxic-appearing or diaphoretic.  HENT:     Head: Normocephalic and atraumatic.     Nose: Nose normal.     Mouth/Throat:     Mouth: Mucous membranes are moist.  Cardiovascular:     Rate and Rhythm: Normal rate and regular rhythm.     Pulses: Normal pulses.     Heart sounds: Normal heart sounds.  Pulmonary:     Effort: Pulmonary effort is normal.     Breath sounds: Normal breath sounds.  Abdominal:     Palpations: Abdomen is soft.  Musculoskeletal:        General: Swelling and tenderness present.     Cervical back: Normal range of motion.     Comments: R knee  Skin:    General: Skin is warm and dry.     Capillary Refill: Capillary refill takes less than 2 seconds.  Neurological:     General: No focal deficit present.     Mental Status: She is alert and oriented to person, place, and time.  Psychiatric:        Mood and Affect: Mood normal.        Behavior: Behavior normal.        Thought Content: Thought content normal.        Judgment: Judgment normal.     Assessment &  Plan:   Problem List Items  Addressed This Visit      Cardiovascular and Mediastinum   Hypertensive disorder Continue with current regimen.        Endocrine   Type 2 diabetes mellitus ([place]) Follow as scheduled Continue with current regimen.       Other   Obesity   Depression   Relevant Orders   Ambulatory referral to Psychiatry   Hyperlipidemia    Other Visit Diagnoses    Insomnia due to other mental disorder    -  Primary Refill on Clonazepam but made patient aware this needs to be taken over by psychiatry. She may not desire to return asa patient for PC due to this    Relevant Orders   Ambulatory referral to Psychiatry      Outpatient Encounter Medications as of 08/02/2020  Medication Sig   albuterol (PROVENTIL HFA;VENTOLIN HFA) 108 (90 Base) MCG/ACT inhaler Inhale 2 puffs into the lungs every 6 (six) hours as needed for wheezing or shortness of breath.   Alcohol Swabs (B-D SINGLE USE SWABS REGULAR) PADS USE AS DIRECTED TWICE DAILY   Cholecalciferol 25 MCG (1000 UT) tablet Take 2,000 Units by mouth daily.   clonazePAM (KLONOPIN) 1 MG tablet Take 1 tablet (1 mg total) by mouth at bedtime. for anxiety   cyclobenzaprine (FLEXERIL) 5 MG tablet Take 1 tablet (5 mg total) by mouth at bedtime.   diphenhydrAMINE (BENADRYL) 25 MG tablet Take 25 mg by mouth every 6 (six) hours as needed.   hydrochlorothiazide (HYDRODIURIL) 12.5 MG tablet TAKE 1 TABLET BY MOUTH ONCE DAILY   Insulin Pen Needle (NOVOFINE) 32G X 6 MM MISC Use to inject 2 times per day   Insulin Pen Needle 31G X 8 MM MISC by Does not apply route.   insulin regular human CONCENTRATED (HUMULIN R U-500 KWIKPEN) 500 UNIT/ML kwikpen INJECT 45 UNITS SUBCUTANEOUSLY IN THE MORNING AND 55 IN THE EVENING. **NEEDS VISIT FOR FUTURE REFILLS**   Insulin Syringe-Needle U-100 (B-D INSULIN SYRINGE) 31G X 5/16" 0.3 ML MISC Use 2 syringes daily with Levemir Insulin   INVOKANA 100 MG TABS tablet TAKE 1 TABLET BY MOUTH ONCE DAILY  BEFORE BREAKFAST   losartan (COZAAR) 50 MG tablet Take 1 tablet (50 mg total) by mouth daily.   lovastatin (MEVACOR) 20 MG tablet Take 1 tablet (20 mg total) by mouth daily with breakfast.   metFORMIN (GLUCOPHAGE) 1000 MG tablet TAKE ONE TABLET BY MOUTH TWICE DAILY WITH FOOD (Patient taking differently: TAKE ONE TABLET BY MOUTH DAILY WITH FOOD)   naproxen (NAPROSYN) 500 MG tablet Take 1 tablet (500 mg total) by mouth 2 (two) times daily with a meal.   sertraline (ZOLOFT) 100 MG tablet Take 1.5 tablets (150 mg total) by mouth daily.   triamcinolone ointment (KENALOG) 0.1 % APPLY OINTMENT EXTERNALLY TO AFFECTED AREA TWICE DAILY FOR 14 DAYS   [DISCONTINUED] clonazePAM (KLONOPIN) 1 MG tablet Take 1 tablet by mouth twice daily as needed for anxiety   Ibuprofen-Famotidine (DUEXIS) 800-26.6 MG TABS Take 1 tablet by mouth 3 (three) times daily between meals as needed.   No facility-administered encounter medications on file as of 08/02/2020.    Follow-up: Return for follow up as needed.   Vevelyn Francois, NP

## 2020-08-02 NOTE — Patient Instructions (Signed)

## 2020-08-19 ENCOUNTER — Telehealth (INDEPENDENT_AMBULATORY_CARE_PROVIDER_SITE_OTHER): Payer: HMO | Admitting: Nurse Practitioner

## 2020-08-19 DIAGNOSIS — F331 Major depressive disorder, recurrent, moderate: Secondary | ICD-10-CM

## 2020-08-19 DIAGNOSIS — F418 Other specified anxiety disorders: Secondary | ICD-10-CM

## 2020-08-19 NOTE — Progress Notes (Signed)
   Destiny Tucker, [place]  [address] Phone:  [phone]     Virtual Visit via Telephone Note  I connected with Destiny Tucker on 08/21/20 at  2:40 PM EDT by telephone and verified that I am speaking with the correct person using two identifiers.   I discussed the limitations, risks, security and privacy concerns of performing an evaluation and management service by telephone and the availability of in person appointments. I also discussed with the patient that there may be a patient responsible charge related to this service. The patient expressed understanding and agreed to proceed.  Patient home Provider Office  History of Present Illness: Destiny Tucker desires to discuss her use of Klonopin. She wants to make me aware that she has spoken with Patient experience.  Observations/Objective: No exam this visit due to :telephone visit.  Assessment and Plan: Assessment  Primary Diagnosis & Pertinent Problem List: The primary encounter diagnosis was Anxiety associated with depression. A diagnosis of Moderate episode of recurrent major depressive disorder (HCC) was also pertinent to this visit.  Visit Diagnosis: 1. Moderate episode of recurrent major depressive disorder ([place])  Continue with sertraline  2. Anxiety associated with depression  Patient does not want to be evaluated at psychiatry for the continue use of benzodiazepines. She is already a new provider. She just wanted to make me aware that she spoke with patient experience related to her previous visit.    Follow Up Instructions: No follow up needed   I discussed the assessment and treatment plan with the patient. The patient was provided an opportunity to ask questions and all were answered. The patient agreed with the plan and demonstrated an understanding of the instructions.   The patient was advised to call back or seek an in-person evaluation if the symptoms worsen or if the  condition fails to improve as anticipated.  I provided 19 minutes of non-face-to-face time during this encounter.   Vevelyn Francois, NP

## 2020-08-21 ENCOUNTER — Encounter: Payer: Self-pay | Admitting: Nurse Practitioner

## 2020-10-26 ENCOUNTER — Other Ambulatory Visit: Payer: Self-pay

## 2020-10-26 NOTE — Patient Outreach (Signed)
  [facility] Care Management Chronic Special Needs Program    10/26/2020  Name: JARETSSI KRAKER, DOB: 1956-06-04  MRN: [id number]   Ms. Saleen Peden is enrolled in a chronic special needs plan for Diabetes.   [place] Management will continue to provide services for this member through 11/19/20.  The [employer] care management team will assume care 11/20/2020.   Quinn Plowman RN,BSN,CCM [facility] [phone]

## 2020-11-23 ENCOUNTER — Other Ambulatory Visit: Payer: Self-pay

## 2020-12-27 DIAGNOSIS — E1165 Type 2 diabetes mellitus with hyperglycemia: Secondary | ICD-10-CM | POA: Diagnosis not present

## 2020-12-27 DIAGNOSIS — Z7984 Long term (current) use of oral hypoglycemic drugs: Secondary | ICD-10-CM | POA: Diagnosis not present

## 2020-12-27 DIAGNOSIS — E119 Type 2 diabetes mellitus without complications: Secondary | ICD-10-CM | POA: Diagnosis not present

## 2020-12-27 DIAGNOSIS — Z794 Long term (current) use of insulin: Secondary | ICD-10-CM | POA: Diagnosis not present

## 2021-01-06 DIAGNOSIS — Z1231 Encounter for screening mammogram for malignant neoplasm of breast: Secondary | ICD-10-CM | POA: Diagnosis not present

## 2021-01-11 DIAGNOSIS — L900 Lichen sclerosus et atrophicus: Secondary | ICD-10-CM | POA: Diagnosis not present

## 2021-01-11 DIAGNOSIS — D485 Neoplasm of uncertain behavior of skin: Secondary | ICD-10-CM | POA: Diagnosis not present

## 2021-01-11 DIAGNOSIS — L603 Nail dystrophy: Secondary | ICD-10-CM | POA: Diagnosis not present

## 2021-01-11 DIAGNOSIS — D2261 Melanocytic nevi of right upper limb, including shoulder: Secondary | ICD-10-CM | POA: Diagnosis not present

## 2021-01-11 DIAGNOSIS — D225 Melanocytic nevi of trunk: Secondary | ICD-10-CM | POA: Diagnosis not present

## 2021-01-11 DIAGNOSIS — L304 Erythema intertrigo: Secondary | ICD-10-CM | POA: Diagnosis not present

## 2021-01-11 DIAGNOSIS — D2262 Melanocytic nevi of left upper limb, including shoulder: Secondary | ICD-10-CM | POA: Diagnosis not present

## 2021-01-11 DIAGNOSIS — L821 Other seborrheic keratosis: Secondary | ICD-10-CM | POA: Diagnosis not present

## 2021-01-26 DIAGNOSIS — I10 Essential (primary) hypertension: Secondary | ICD-10-CM | POA: Diagnosis not present

## 2021-01-26 DIAGNOSIS — E7800 Pure hypercholesterolemia, unspecified: Secondary | ICD-10-CM | POA: Diagnosis not present

## 2021-01-26 DIAGNOSIS — E1169 Type 2 diabetes mellitus with other specified complication: Secondary | ICD-10-CM | POA: Diagnosis not present

## 2021-02-08 DIAGNOSIS — L02214 Cutaneous abscess of groin: Secondary | ICD-10-CM | POA: Diagnosis not present

## 2021-02-08 DIAGNOSIS — D485 Neoplasm of uncertain behavior of skin: Secondary | ICD-10-CM | POA: Diagnosis not present

## 2021-02-08 DIAGNOSIS — L0889 Other specified local infections of the skin and subcutaneous tissue: Secondary | ICD-10-CM | POA: Diagnosis not present

## 2021-02-08 DIAGNOSIS — L309 Dermatitis, unspecified: Secondary | ICD-10-CM | POA: Diagnosis not present

## 2021-02-08 DIAGNOSIS — L308 Other specified dermatitis: Secondary | ICD-10-CM | POA: Diagnosis not present

## 2021-02-22 DIAGNOSIS — L309 Dermatitis, unspecified: Secondary | ICD-10-CM | POA: Diagnosis not present

## 2021-03-23 DIAGNOSIS — L293 Anogenital pruritus, unspecified: Secondary | ICD-10-CM | POA: Diagnosis not present

## 2021-03-23 DIAGNOSIS — L900 Lichen sclerosus et atrophicus: Secondary | ICD-10-CM | POA: Diagnosis not present

## 2021-04-11 DIAGNOSIS — E785 Hyperlipidemia, unspecified: Secondary | ICD-10-CM | POA: Diagnosis not present

## 2021-04-11 DIAGNOSIS — Z794 Long term (current) use of insulin: Secondary | ICD-10-CM | POA: Diagnosis not present

## 2021-04-11 DIAGNOSIS — Z7984 Long term (current) use of oral hypoglycemic drugs: Secondary | ICD-10-CM | POA: Diagnosis not present

## 2021-04-11 DIAGNOSIS — Z79899 Other long term (current) drug therapy: Secondary | ICD-10-CM | POA: Diagnosis not present

## 2021-04-11 DIAGNOSIS — E119 Type 2 diabetes mellitus without complications: Secondary | ICD-10-CM | POA: Diagnosis not present

## 2021-04-11 DIAGNOSIS — E1165 Type 2 diabetes mellitus with hyperglycemia: Secondary | ICD-10-CM | POA: Diagnosis not present

## 2021-04-28 DIAGNOSIS — Z85828 Personal history of other malignant neoplasm of skin: Secondary | ICD-10-CM | POA: Diagnosis not present

## 2021-04-28 DIAGNOSIS — L309 Dermatitis, unspecified: Secondary | ICD-10-CM | POA: Diagnosis not present

## 2021-04-28 DIAGNOSIS — L821 Other seborrheic keratosis: Secondary | ICD-10-CM | POA: Diagnosis not present

## 2021-04-28 DIAGNOSIS — I788 Other diseases of capillaries: Secondary | ICD-10-CM | POA: Diagnosis not present

## 2021-05-03 DIAGNOSIS — E1169 Type 2 diabetes mellitus with other specified complication: Secondary | ICD-10-CM | POA: Diagnosis not present

## 2021-05-03 DIAGNOSIS — I10 Essential (primary) hypertension: Secondary | ICD-10-CM | POA: Diagnosis not present

## 2021-05-03 DIAGNOSIS — M1990 Unspecified osteoarthritis, unspecified site: Secondary | ICD-10-CM | POA: Diagnosis not present

## 2021-05-03 DIAGNOSIS — Z23 Encounter for immunization: Secondary | ICD-10-CM | POA: Diagnosis not present

## 2021-05-11 DIAGNOSIS — H43812 Vitreous degeneration, left eye: Secondary | ICD-10-CM | POA: Diagnosis not present

## 2021-05-11 DIAGNOSIS — H2513 Age-related nuclear cataract, bilateral: Secondary | ICD-10-CM | POA: Diagnosis not present

## 2021-05-11 DIAGNOSIS — E119 Type 2 diabetes mellitus without complications: Secondary | ICD-10-CM | POA: Diagnosis not present

## 2021-05-11 DIAGNOSIS — H0102A Squamous blepharitis right eye, upper and lower eyelids: Secondary | ICD-10-CM | POA: Diagnosis not present

## 2021-06-20 DIAGNOSIS — E1165 Type 2 diabetes mellitus with hyperglycemia: Secondary | ICD-10-CM | POA: Diagnosis not present

## 2021-06-21 ENCOUNTER — Ambulatory Visit: Payer: Self-pay

## 2021-07-15 DIAGNOSIS — Z85828 Personal history of other malignant neoplasm of skin: Secondary | ICD-10-CM | POA: Diagnosis not present

## 2021-07-15 DIAGNOSIS — L309 Dermatitis, unspecified: Secondary | ICD-10-CM | POA: Diagnosis not present

## 2021-07-20 DIAGNOSIS — E1165 Type 2 diabetes mellitus with hyperglycemia: Secondary | ICD-10-CM | POA: Diagnosis not present

## 2021-08-09 DIAGNOSIS — Z794 Long term (current) use of insulin: Secondary | ICD-10-CM | POA: Diagnosis not present

## 2021-08-09 DIAGNOSIS — E1140 Type 2 diabetes mellitus with diabetic neuropathy, unspecified: Secondary | ICD-10-CM | POA: Diagnosis not present

## 2021-08-09 DIAGNOSIS — E785 Hyperlipidemia, unspecified: Secondary | ICD-10-CM | POA: Diagnosis not present

## 2021-08-09 DIAGNOSIS — Z7984 Long term (current) use of oral hypoglycemic drugs: Secondary | ICD-10-CM | POA: Diagnosis not present

## 2021-08-09 DIAGNOSIS — Z79899 Other long term (current) drug therapy: Secondary | ICD-10-CM | POA: Diagnosis not present

## 2021-08-19 DIAGNOSIS — E1165 Type 2 diabetes mellitus with hyperglycemia: Secondary | ICD-10-CM | POA: Diagnosis not present

## 2021-08-30 DIAGNOSIS — L309 Dermatitis, unspecified: Secondary | ICD-10-CM | POA: Diagnosis not present

## 2021-08-30 DIAGNOSIS — Z85828 Personal history of other malignant neoplasm of skin: Secondary | ICD-10-CM | POA: Diagnosis not present

## 2021-09-26 DIAGNOSIS — E1165 Type 2 diabetes mellitus with hyperglycemia: Secondary | ICD-10-CM | POA: Diagnosis not present

## 2021-10-26 DIAGNOSIS — E1165 Type 2 diabetes mellitus with hyperglycemia: Secondary | ICD-10-CM | POA: Diagnosis not present

## 2021-10-27 DIAGNOSIS — L309 Dermatitis, unspecified: Secondary | ICD-10-CM | POA: Diagnosis not present

## 2021-11-01 DIAGNOSIS — Z7985 Long-term (current) use of injectable non-insulin antidiabetic drugs: Secondary | ICD-10-CM | POA: Diagnosis not present

## 2021-11-01 DIAGNOSIS — E785 Hyperlipidemia, unspecified: Secondary | ICD-10-CM | POA: Diagnosis not present

## 2021-11-01 DIAGNOSIS — Z794 Long term (current) use of insulin: Secondary | ICD-10-CM | POA: Diagnosis not present

## 2021-11-01 DIAGNOSIS — E1129 Type 2 diabetes mellitus with other diabetic kidney complication: Secondary | ICD-10-CM | POA: Diagnosis not present

## 2021-11-01 DIAGNOSIS — Z7984 Long term (current) use of oral hypoglycemic drugs: Secondary | ICD-10-CM | POA: Diagnosis not present

## 2021-11-01 DIAGNOSIS — E1142 Type 2 diabetes mellitus with diabetic polyneuropathy: Secondary | ICD-10-CM | POA: Diagnosis not present

## 2021-11-01 DIAGNOSIS — R809 Proteinuria, unspecified: Secondary | ICD-10-CM | POA: Diagnosis not present

## 2021-11-25 DIAGNOSIS — E1165 Type 2 diabetes mellitus with hyperglycemia: Secondary | ICD-10-CM | POA: Diagnosis not present

## 2021-12-20 DIAGNOSIS — M1990 Unspecified osteoarthritis, unspecified site: Secondary | ICD-10-CM | POA: Diagnosis not present

## 2021-12-20 DIAGNOSIS — E7800 Pure hypercholesterolemia, unspecified: Secondary | ICD-10-CM | POA: Diagnosis not present

## 2021-12-20 DIAGNOSIS — Z0000 Encounter for general adult medical examination without abnormal findings: Secondary | ICD-10-CM | POA: Diagnosis not present

## 2021-12-20 DIAGNOSIS — D1722 Benign lipomatous neoplasm of skin and subcutaneous tissue of left arm: Secondary | ICD-10-CM | POA: Diagnosis not present

## 2021-12-20 DIAGNOSIS — R2689 Other abnormalities of gait and mobility: Secondary | ICD-10-CM | POA: Diagnosis not present

## 2021-12-30 DIAGNOSIS — I10 Essential (primary) hypertension: Secondary | ICD-10-CM | POA: Diagnosis not present

## 2021-12-30 DIAGNOSIS — E1169 Type 2 diabetes mellitus with other specified complication: Secondary | ICD-10-CM | POA: Diagnosis not present

## 2022-01-02 DIAGNOSIS — E1165 Type 2 diabetes mellitus with hyperglycemia: Secondary | ICD-10-CM | POA: Diagnosis not present

## 2022-01-12 DIAGNOSIS — Z1231 Encounter for screening mammogram for malignant neoplasm of breast: Secondary | ICD-10-CM | POA: Diagnosis not present

## 2022-01-17 DIAGNOSIS — B353 Tinea pedis: Secondary | ICD-10-CM | POA: Diagnosis not present

## 2022-01-17 DIAGNOSIS — L821 Other seborrheic keratosis: Secondary | ICD-10-CM | POA: Diagnosis not present

## 2022-01-17 DIAGNOSIS — D2261 Melanocytic nevi of right upper limb, including shoulder: Secondary | ICD-10-CM | POA: Diagnosis not present

## 2022-01-17 DIAGNOSIS — L570 Actinic keratosis: Secondary | ICD-10-CM | POA: Diagnosis not present

## 2022-01-17 DIAGNOSIS — D1722 Benign lipomatous neoplasm of skin and subcutaneous tissue of left arm: Secondary | ICD-10-CM | POA: Diagnosis not present

## 2022-01-17 DIAGNOSIS — Z85828 Personal history of other malignant neoplasm of skin: Secondary | ICD-10-CM | POA: Diagnosis not present

## 2022-01-17 DIAGNOSIS — Q825 Congenital non-neoplastic nevus: Secondary | ICD-10-CM | POA: Diagnosis not present

## 2022-01-17 DIAGNOSIS — D2262 Melanocytic nevi of left upper limb, including shoulder: Secondary | ICD-10-CM | POA: Diagnosis not present

## 2022-01-17 DIAGNOSIS — L309 Dermatitis, unspecified: Secondary | ICD-10-CM | POA: Diagnosis not present

## 2022-01-31 DIAGNOSIS — E1142 Type 2 diabetes mellitus with diabetic polyneuropathy: Secondary | ICD-10-CM | POA: Diagnosis not present

## 2022-01-31 DIAGNOSIS — Z794 Long term (current) use of insulin: Secondary | ICD-10-CM | POA: Diagnosis not present

## 2022-01-31 DIAGNOSIS — E1129 Type 2 diabetes mellitus with other diabetic kidney complication: Secondary | ICD-10-CM | POA: Diagnosis not present

## 2022-01-31 DIAGNOSIS — Z7984 Long term (current) use of oral hypoglycemic drugs: Secondary | ICD-10-CM | POA: Diagnosis not present

## 2022-01-31 DIAGNOSIS — R809 Proteinuria, unspecified: Secondary | ICD-10-CM | POA: Diagnosis not present

## 2022-01-31 DIAGNOSIS — Z7985 Long-term (current) use of injectable non-insulin antidiabetic drugs: Secondary | ICD-10-CM | POA: Diagnosis not present

## 2022-01-31 DIAGNOSIS — E1169 Type 2 diabetes mellitus with other specified complication: Secondary | ICD-10-CM | POA: Diagnosis not present

## 2022-01-31 DIAGNOSIS — E785 Hyperlipidemia, unspecified: Secondary | ICD-10-CM | POA: Diagnosis not present

## 2022-02-01 DIAGNOSIS — E1165 Type 2 diabetes mellitus with hyperglycemia: Secondary | ICD-10-CM | POA: Diagnosis not present

## 2022-03-03 DIAGNOSIS — E1165 Type 2 diabetes mellitus with hyperglycemia: Secondary | ICD-10-CM | POA: Diagnosis not present

## 2022-05-09 DIAGNOSIS — E7800 Pure hypercholesterolemia, unspecified: Secondary | ICD-10-CM | POA: Diagnosis not present

## 2022-05-09 DIAGNOSIS — M25531 Pain in right wrist: Secondary | ICD-10-CM | POA: Diagnosis not present

## 2022-05-09 DIAGNOSIS — M25532 Pain in left wrist: Secondary | ICD-10-CM | POA: Diagnosis not present

## 2022-05-09 DIAGNOSIS — I10 Essential (primary) hypertension: Secondary | ICD-10-CM | POA: Diagnosis not present

## 2022-05-09 DIAGNOSIS — E1169 Type 2 diabetes mellitus with other specified complication: Secondary | ICD-10-CM | POA: Diagnosis not present

## 2022-06-22 ENCOUNTER — Ambulatory Visit
Admission: RE | Admit: 2022-06-22 | Discharge: 2022-06-22 | Disposition: A | Discharge status: Home / Self Care | Payer: Medicare Other | Source: Ambulatory Visit | Attending: Internal Medicine | Admitting: Internal Medicine

## 2022-06-22 VITALS — BP 134/70 | HR 83 | Temp 97.5°F | Resp 14

## 2022-06-22 DIAGNOSIS — J018 Other acute sinusitis: Secondary | ICD-10-CM | POA: Diagnosis not present

## 2022-06-22 MED ORDER — AMOXICILLIN 875 MG PO TABS: 875.0000 mg | ORAL_TABLET | Freq: Two times a day (BID) | ORAL | 0 refills | Status: AC

## 2022-06-22 NOTE — ED Triage Notes (Signed)
Pt presents to [facility] with co of sinus pressure and pain in right side of face for 3 days. Pt reports Advil for symptoms

## 2022-06-22 NOTE — ED Provider Notes (Signed)
[facility]    CSN: [id number] Arrival date & time: 06/22/22  1555      History   Chief Complaint Chief Complaint  Patient presents with   Facial Pain    HPI Destiny Tucker is a 66 y.o. female.   Patient presents with nasal congestion, sinus pressure, dental pain, right ear pain that started a little over a week ago.  Denies any associated sick contacts or fever.  Patient reports mild nonproductive cough.  Patient has taken Advil and Flonase for symptoms with minimal improvement.  Denies chest pain, shortness of breath, sore throat, nausea, vomiting, diarrhea, abdominal pain.     Past Medical History:  Diagnosis Date   ALLERGIC RHINITIS    ANXIETY    DEPRESSION    Diabetes mellitus ([place])    Genital herpes    HYPERLIPIDEMIA    HYPERTENSION    Internal hemorrhoids without mention of complication 85/12/7739   Colonoscopy--Dr. Fuller Plan , pts. states hemorrhoids are uncomfortable   OSTEOPENIA    TOBACCO USE, QUIT     Patient Active Problem List   Diagnosis Date Noted   Arthritis 01/21/2020   Osteopenia 01/21/2020   Abnormal cervical Papanicolaou smear 12/22/2019   Chronic interstitial cystitis 12/22/2019   Genital herpes simplex 12/22/2019   Actinic keratosis 09/04/2019   Diffuse photodamage of skin 09/04/2019   History of nonmelanoma skin cancer 09/04/2019   Routine adult health maintenance 05/21/2017   Obesity 12/22/2016   Degenerative arthritis of knee 10/25/2016   Right knee pain 05/09/2016   Chronic pain of both knees 03/16/2016   Trochanteric bursitis of right hip 01/28/2016   Acute sinusitis 09/28/2015   Carpal tunnel syndrome 09/28/2015   Pre-employment health screening examination 05/26/2015   Muscle spasms of neck 03/09/2015   Bilateral lower extremity edema 03/09/2015   Degenerative arthritis of left knee 01/26/2015   Pronation deformity of both feet 01/26/2015   Bladder problem 08/02/2014   Type 2 diabetes mellitus ([place]) 08/30/2010    Hyperlipidemia 05/18/2010   Depression 05/18/2010   Hypertensive disorder 05/18/2010   Allergic rhinitis 05/18/2010   TOBACCO USE, QUIT 05/18/2010   HYSTEROSCOPY, HX OF 05/18/2010   Anxiety 10/26/2008   Hemorrhoids, internal 10/26/2008   Disorder of bone and cartilage 10/26/2008    Past Surgical History:  Procedure Laterality Date   ANUS SURGERY     MOHS SURGERY     Precancerous skin lesion   Removal of cyst & other ovary  09/2004   Removal of fallopion tube & ovary  1986   Unilateral Salpingo-Oophorectomy      OB History   No obstetric history on file.      Home Medications    Prior to Admission medications   Medication Sig Start Date End Date Taking? Authorizing Provider  amoxicillin (AMOXIL) 875 MG tablet Take 1 tablet (875 mg total) by mouth 2 (two) times daily for 10 days. 06/22/22 07/02/22 Yes Revere Maahs, Michele Rockers, FNP  albuterol (PROVENTIL HFA;VENTOLIN HFA) 108 (90 Base) MCG/ACT inhaler Inhale 2 puffs into the lungs every 6 (six) hours as needed for wheezing or shortness of breath. 08/19/18   Margot Ables, NP  Alcohol Swabs (B-D SINGLE USE SWABS REGULAR) PADS USE AS DIRECTED TWICE DAILY 12/01/13   Elayne Snare, MD  Cholecalciferol 25 MCG (1000 UT) tablet Take 2,000 Units by mouth daily.    [provider]  clonazePAM (KLONOPIN) 1 MG tablet Take 1 tablet (1 mg total) by mouth at bedtime. for anxiety 08/02/20  Vevelyn Francois, NP  cyclobenzaprine (FLEXERIL) 5 MG tablet Take 1 tablet (5 mg total) by mouth at bedtime. 02/28/20   Hall-Potvin, [place], PA-C  diphenhydrAMINE (BENADRYL) 25 MG tablet Take 25 mg by mouth every 6 (six) hours as needed.    [provider]  hydrochlorothiazide (HYDRODIURIL) 12.5 MG tablet TAKE 1 TABLET BY MOUTH ONCE DAILY 10/25/18   Dragnev, Caesar Chestnut, NP  Insulin Pen Needle (NOVOFINE) 32G X 6 MM MISC Use to inject 2 times per day 10/18/16   Elayne Snare, MD  Insulin Pen Needle 31G X 8 MM MISC by Does not apply route.     [provider]  insulin regular human CONCENTRATED (HUMULIN R U-500 KWIKPEN) 500 UNIT/ML kwikpen INJECT 45 UNITS SUBCUTANEOUSLY IN THE MORNING AND 55 IN THE EVENING. **NEEDS VISIT FOR FUTURE REFILLS** 05/12/19   Plotnikov, Evie Lacks, MD  Insulin Syringe-Needle U-100 (B-D INSULIN SYRINGE) 31G X 5/16" 0.3 ML MISC Use 2 syringes daily with Levemir Insulin 10/17/16   Elayne Snare, MD  INVOKANA 100 MG TABS tablet TAKE 1 TABLET BY MOUTH ONCE DAILY BEFORE BREAKFAST 10/31/17   Elayne Snare, MD  losartan (COZAAR) 50 MG tablet Take 1 tablet (50 mg total) by mouth daily. 10/29/18   Margot Ables, NP  lovastatin (MEVACOR) 20 MG tablet Take 1 tablet (20 mg total) by mouth daily with breakfast. 11/18/18   Dragnev, Caesar Chestnut, NP  metFORMIN (GLUCOPHAGE) 1000 MG tablet TAKE ONE TABLET BY MOUTH TWICE DAILY WITH FOOD Patient taking differently: TAKE ONE TABLET BY MOUTH DAILY WITH FOOD 10/31/16   Elayne Snare, MD  naproxen (NAPROSYN) 500 MG tablet Take 1 tablet (500 mg total) by mouth 2 (two) times daily with a meal. 10/10/18   Dragnev, Caesar Chestnut, NP  sertraline (ZOLOFT) 100 MG tablet Take 1.5 tablets (150 mg total) by mouth daily. 10/10/18   Dragnev, Caesar Chestnut, NP  triamcinolone ointment (KENALOG) 0.1 % APPLY OINTMENT EXTERNALLY TO AFFECTED AREA TWICE DAILY FOR 14 DAYS 08/27/18   [provider]    Family History Family History  Problem Relation Age of Onset   Hypertension Mother    Hyperlipidemia Mother    Nephrolithiasis Mother    Diabetes Mother    Hypertension Father    Hyperlipidemia Father    Nephrolithiasis Father    Diabetes Maternal Uncle     Social History Social History   Tobacco Use   Smoking status: Former    Types: Cigarettes    Quit date: 02/18/2010    Years since quitting: 12.3   Smokeless tobacco: Former   Tobacco comments:    Married, lives with spouse. 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 --      Excl. in [place]? --    No data found.  Updated Vital Signs BP 134/70   Pulse 83   Temp (!) 97.5 F (36.4 C)   Resp 14   SpO2 96%   Visual Acuity Right Eye Distance:   Left Eye Distance:   Bilateral Distance:    Right Eye Near:   Left Eye Near:    Bilateral Near:     Physical Exam Constitutional:      General: She is not in acute distress.    Appearance: Normal appearance. She is not toxic-appearing or diaphoretic.  HENT:     Head: Normocephalic and atraumatic.     Right Ear: Tympanic membrane and ear canal normal.     Left Ear: Tympanic membrane and ear canal normal.     Nose: Congestion present.     Right Sinus: Maxillary sinus tenderness present. No frontal sinus tenderness.     Left Sinus: No maxillary sinus tenderness or frontal sinus tenderness.     Mouth/Throat:     Mouth: Mucous membranes are moist.     Dentition: Normal dentition. Does not have dentures. No dental tenderness, gingival swelling, dental caries or dental abscesses.     Pharynx: No pharyngeal swelling, oropharyngeal exudate, posterior oropharyngeal erythema or uvula swelling.     Tonsils: No tonsillar exudate or tonsillar abscesses.  Eyes:     Extraocular Movements: Extraocular movements intact.     Conjunctiva/sclera: Conjunctivae normal.     Pupils: Pupils are equal, round, and reactive to light.  Cardiovascular:     Rate and Rhythm:  Normal rate and regular rhythm.     Pulses: Normal pulses.     Heart sounds: Normal heart sounds.  Pulmonary:     Effort: Pulmonary effort is normal. No respiratory distress.     Breath sounds: Normal breath sounds. No stridor. No wheezing, rhonchi or rales.  Abdominal:     General: Abdomen is flat. Bowel sounds are normal.     Palpations: Abdomen is soft.  Musculoskeletal:        General: Normal range of motion.     Cervical back: Normal range of motion.  Skin:    General: Skin is warm and dry.  Neurological:     General: No focal deficit present.     Mental Status: She is alert and oriented to person, place, and time. Mental status is at baseline.  Psychiatric:        Mood and Affect: Mood normal.        Behavior: Behavior normal.      UC Treatments / Results  Labs (all labs ordered are listed, but only abnormal results are displayed) Labs Reviewed - No data to display  EKG   Radiology No results found.  Procedures Procedures (including critical care time)  Medications Ordered in UC Medications - No data to display  Initial Impression / Assessment and Plan / UC Course  I have reviewed the triage vital signs and the nursing notes.  Pertinent labs & imaging results that were available during my care of the patient were reviewed by me and considered in my medical decision making (see chart for details).     Suspect sinus infection.  Suspect dental pain is related to sinus pressure given no signs of dental infection on exam.  Will treat with amoxicillin antibiotic given duration of  symptoms.  Do not think chest imaging is necessary given no adventitious lung sounds on exam.  Advised to follow-up if symptoms persist or worsen.  Patient verbalized understanding and was agreeable with plan. Final Clinical Impressions(s) / UC Diagnoses   Final diagnoses:  Acute non-recurrent sinusitis of other sinus     Discharge Instructions      It appears that you have a sinus  infection.  This is being treated with antibiotic. please follow-up if symptoms persist or worsen.    ED Prescriptions     Medication Sig Dispense Auth. Provider   amoxicillin (AMOXIL) 875 MG tablet Take 1 tablet (875 mg total) by mouth 2 (two) times daily for 10 days. 20 tablet [place], Michele Rockers, [place]      PDMP not reviewed this encounter.   Teodora Medici, [place] 06/22/22 [id number]

## 2022-06-22 NOTE — Discharge Instructions (Signed)
It appears that you have a sinus infection.  This is being treated with antibiotic. please follow-up if symptoms persist or worsen.

## 2022-07-12 DIAGNOSIS — E1169 Type 2 diabetes mellitus with other specified complication: Secondary | ICD-10-CM | POA: Diagnosis not present

## 2022-07-12 DIAGNOSIS — I10 Essential (primary) hypertension: Secondary | ICD-10-CM | POA: Diagnosis not present

## 2022-07-12 DIAGNOSIS — E7800 Pure hypercholesterolemia, unspecified: Secondary | ICD-10-CM | POA: Diagnosis not present

## 2022-07-18 DIAGNOSIS — D2262 Melanocytic nevi of left upper limb, including shoulder: Secondary | ICD-10-CM | POA: Diagnosis not present

## 2022-07-18 DIAGNOSIS — L821 Other seborrheic keratosis: Secondary | ICD-10-CM | POA: Diagnosis not present

## 2022-07-18 DIAGNOSIS — L570 Actinic keratosis: Secondary | ICD-10-CM | POA: Diagnosis not present

## 2022-07-18 DIAGNOSIS — D225 Melanocytic nevi of trunk: Secondary | ICD-10-CM | POA: Diagnosis not present

## 2022-07-18 DIAGNOSIS — D2261 Melanocytic nevi of right upper limb, including shoulder: Secondary | ICD-10-CM | POA: Diagnosis not present

## 2022-07-18 DIAGNOSIS — Z85828 Personal history of other malignant neoplasm of skin: Secondary | ICD-10-CM | POA: Diagnosis not present

## 2022-07-18 DIAGNOSIS — L280 Lichen simplex chronicus: Secondary | ICD-10-CM | POA: Diagnosis not present

## 2022-07-18 DIAGNOSIS — B353 Tinea pedis: Secondary | ICD-10-CM | POA: Diagnosis not present

## 2022-07-18 DIAGNOSIS — D1722 Benign lipomatous neoplasm of skin and subcutaneous tissue of left arm: Secondary | ICD-10-CM | POA: Diagnosis not present

## 2022-07-18 DIAGNOSIS — L304 Erythema intertrigo: Secondary | ICD-10-CM | POA: Diagnosis not present

## 2022-07-18 DIAGNOSIS — Q825 Congenital non-neoplastic nevus: Secondary | ICD-10-CM | POA: Diagnosis not present

## 2022-07-18 DIAGNOSIS — L820 Inflamed seborrheic keratosis: Secondary | ICD-10-CM | POA: Diagnosis not present

## 2022-07-21 DIAGNOSIS — S62102A Fracture of unspecified carpal bone, left wrist, initial encounter for closed fracture: Secondary | ICD-10-CM

## 2022-07-21 HISTORY — DX: Fracture of unspecified carpal bone, left wrist, initial encounter for closed fracture: S62.102A

## 2022-07-26 ENCOUNTER — Ambulatory Visit
Admission: RE | Admit: 2022-07-26 | Discharge: 2022-07-26 | Disposition: A | Discharge status: Home / Self Care | Payer: Medicare Other | Source: Ambulatory Visit | Attending: Family Medicine | Admitting: Family Medicine

## 2022-07-26 ENCOUNTER — Other Ambulatory Visit: Payer: Self-pay | Admitting: Family Medicine

## 2022-07-26 DIAGNOSIS — S6992XA Unspecified injury of left wrist, hand and finger(s), initial encounter: Secondary | ICD-10-CM

## 2022-07-26 DIAGNOSIS — M25432 Effusion, left wrist: Secondary | ICD-10-CM | POA: Diagnosis not present

## 2022-07-26 DIAGNOSIS — M25532 Pain in left wrist: Secondary | ICD-10-CM | POA: Diagnosis not present

## 2022-07-28 ENCOUNTER — Encounter (HOSPITAL_COMMUNITY): Payer: Self-pay | Admitting: Orthopedic Surgery

## 2022-07-28 DIAGNOSIS — M25532 Pain in left wrist: Secondary | ICD-10-CM | POA: Diagnosis not present

## 2022-07-28 NOTE — Progress Notes (Addendum)
Addendum:  received call from pt .  Pt stated she spoke with her endocrinologist and was given instructions to do half dose insulin night before surgery.   Spoke w/ via phone for pre-op interview--- pt Lab needs dos---- [employer], ekg              Lab results------ no COVID test -----patient states asymptomatic no test needed Arrive at -------  1045 on 08-01-2022 NPO after MN NO Solid Food.  Clear liquids from MN until--- 0945 Med rec completed Medications to take morning of surgery ----- none Diabetic medication -----  do not take jardiance morning of surgery and do not humulin U-500 morning of surgery Patient instructed no nail polish to be worn day of surgery Patient instructed to bring photo id and insurance card day of surgery Patient aware to have Driver (ride ) / caregiver for 24 hours after surgery --  nephew, michael Patient Special Instructions ----- pt verbalized understanding to call her endocrinologist, Fredia Sorrow NP, on Monday 07-31-2022 for instruction for insulin night before surgery. Pre-Op special Istructions ----- case just added on , pending orders Patient verbalized understanding of instructions that were given at this phone interview. Patient denies shortness of breath, chest pain, fever, cough at this phone interview.

## 2022-07-31 ENCOUNTER — Encounter (HOSPITAL_COMMUNITY): Payer: Self-pay | Admitting: Orthopedic Surgery

## 2022-07-31 NOTE — H&P (Signed)
PREOPERATIVE H&P  Chief Complaint: LEFT WRIST FRACTURE  HPI: Destiny Tucker is a 66 y.o. female who presents with a diagnosis of LEFT WRIST FRACTURE. Symptoms are rated as moderate to severe, and have been worsening.  This is significantly impairing activities of daily living.  She has elected for surgical management.   Past Medical History:  Diagnosis Date   Allergic rhinitis    Carpal tunnel syndrome    GAD (generalized anxiety disorder)    Genital herpes    Hemorrhoids    History of low-risk melanoma    per pt excision rectal/ perineal area (moh's per pt) yrs ago   History of SCC (squamous cell carcinoma) of skin 2010   Hyperlipidemia    Hypertension    Internal hemorrhoids without mention of complication    Left wrist fracture 07/2022   MDD (major depressive disorder)    OA (osteoarthritis)    Osteoporosis    Type 2 diabetes mellitus treated with insulin [facility])    endocrinologist-- jamie huffman np;   checks blood sugar w/ Elenor Legato,  (07-28-2022 pt stated fasting blood sugar 130--140)   Past Surgical History:  Procedure Laterality Date   LAPAROSCOPIC SALPINGO OOPHERECTOMY Left 1983   LAPAROSCOPIC SALPINGOOPHERECTOMY Right 09/21/2004   '@[facility]'$   by dr Gaetano Net;   EXTENSIVE LYSIS ADHESIONS AND D&C HYSTEROSCOPY   MOHS SURGERY     per pt yrs ago rectal / perineal area   Social History   Socioeconomic History   Marital status: Married    Spouse name: Not on file   Number of children: Not on file   Years of education: Not on file   Highest education level: Not on file  Occupational History   Occupation: Retired    Fish farm manager: [employer]  Tobacco Use   Smoking status: Former    Years: 25.00    Types: Cigarettes    Quit date: 2009    Years since quitting: 14.7   Smokeless tobacco: Never  Vaping Use   Vaping Use: Never used  Substance and Sexual Activity   Alcohol use: Not Currently    Comment: rare   Drug use: Never   Sexual activity: Not on file  Other  Topics Concern   Not on file  Social History Narrative   Not on file   Social Determinants of Health   Financial Resource Strain: Not on file  Food Insecurity: Not on file  Transportation Needs: Not on file  Physical Activity: Not on file  Stress: Not on file  Social Connections: Not on file   Family History  Problem Relation Age of Onset   Hypertension Mother    Hyperlipidemia Mother    Nephrolithiasis Mother    Diabetes Mother    Hypertension Father    Hyperlipidemia Father    Nephrolithiasis Father    Diabetes Maternal Uncle    Allergies  Allergen Reactions   Codeine Nausea And Vomiting   Prior to Admission medications   Medication Sig Start Date End Date Taking? Authorizing Provider  BIOTIN PO Take by mouth at bedtime.   Yes [provider]  Calcium Carbonate-Vit D-Min (CALCIUM 1200 PO) Take by mouth at bedtime.   Yes [provider]  Cholecalciferol (VITAMIN D-3) 25 MCG (1000 UT) CAPS Take 1 capsule by mouth at bedtime.   Yes [provider]  Coenzyme Q10 (COQ10 PO) Take by mouth at bedtime.   Yes [provider]  empagliflozin (JARDIANCE) 25 MG TABS tablet Take 25 mg by  mouth daily.   Yes [provider]  hydrochlorothiazide (HYDRODIURIL) 12.5 MG tablet TAKE 1 TABLET BY MOUTH ONCE DAILY Patient taking differently: Take 12.5 mg by mouth daily. 10/25/18  Yes Dragnev, Caesar Chestnut, NP  insulin regular human CONCENTRATED (HUMULIN R) 500 UNIT/ML injection Inject 30 Units into the skin 2 (two) times daily. Pt has pen with U500/ 91m   Yes [provider]  losartan (COZAAR) 50 MG tablet Take 1 tablet (50 mg total) by mouth daily. Patient taking differently: Take 50 mg by mouth daily. 10/29/18  Yes Dragnev, ACaesar Chestnut NP  lovastatin (MEVACOR) 20 MG tablet Take 1 tablet (20 mg total) by mouth daily with breakfast. Patient taking differently: Take 20 mg by mouth at bedtime. 11/18/18  Yes Dragnev, ACaesar Chestnut  NP  metFORMIN (GLUCOPHAGE) 1000 MG tablet TAKE ONE TABLET BY MOUTH TWICE DAILY WITH FOOD Patient taking differently: Take 1,000 mg by mouth at bedtime. 10/31/16  Yes KElayne Snare MD  Olopatadine HCl (PATADAY OP) Apply to eye as needed.   Yes [provider]  Probiotic Product (PROBIOTIC DAILY) CAPS Take by mouth at bedtime.   Yes [provider]  Semaglutide, 1 MG/DOSE, (OZEMPIC, 1 MG/DOSE,) 4 MG/3ML SOPN Inject 1 mg into the skin once a week. Friday's   Yes [provider]  sertraline (ZOLOFT) 100 MG tablet Take 1.5 tablets (150 mg total) by mouth daily. Patient taking differently: Take 150 mg by mouth at bedtime. 10/10/18  Yes Dragnev, ACaesar Chestnut NP  clonazePAM (KLONOPIN) 1 MG tablet Take 1 tablet (1 mg total) by mouth at bedtime. for anxiety Patient taking differently: Take 1 mg by mouth 2 (two) times daily as needed. for anxiety 08/02/20   KVevelyn Francois NP  Insulin Pen Needle (NOVOFINE) 32G X 6 MM MISC Use to inject 2 times per day Patient not taking: Reported on 07/28/2022 10/18/16   KElayne Snare MD  Insulin Pen Needle 31G X 8 MM MISC by Does not apply route. Patient not taking: Reported on 07/28/2022    [provider]     Positive ROS: All other systems have been reviewed and were otherwise negative with the exception of those mentioned in the HPI and as above.  Physical Exam: General: Alert, no acute distress Cardiovascular: No pedal edema Respiratory: No cyanosis, no use of accessory musculature GI: No organomegaly, abdomen is soft and non-tender Skin: No lesions in the area of chief complaint Neurologic: Sensation intact distally Psychiatric: Patient is competent for consent with normal mood and affect Lymphatic: No axillary or cervical lymphadenopathy  MUSCULOSKELETAL: TTP left wrist, limited wrist ROM, edema and ecchymosis present, NVI   Imaging: 2v left wrist xrays significant displacement of distal radius fracture with  intra-articular involvement   Assessment: LEFT WRIST FRACTURE  Plan: Plan for Procedure(s): OPEN REDUCTION INTERNAL FIXATION (ORIF) WRIST FRACTURE  The risks benefits and alternatives were discussed with the patient including but not limited to the risks of nonoperative treatment, versus surgical intervention including infection, bleeding, nerve injury,  blood clots, cardiopulmonary complications, morbidity, mortality, among others, and they were willing to proceed.   Weightbearing: NWB LUE Orthopedic devices: splint Showering: keep splint dry Dressing: reinforce PRN Medicines: ASA , Norco, Mobic, Zofran  Discharge: home Follow up: 2 weeks    MAlisa GraffOffice [phone]/09/2022 11:21 AM

## 2022-08-01 ENCOUNTER — Ambulatory Visit (HOSPITAL_BASED_OUTPATIENT_CLINIC_OR_DEPARTMENT_OTHER): Payer: Medicare Other

## 2022-08-01 ENCOUNTER — Ambulatory Visit (HOSPITAL_BASED_OUTPATIENT_CLINIC_OR_DEPARTMENT_OTHER): Payer: Medicare Other | Admitting: Anesthesiology

## 2022-08-01 ENCOUNTER — Encounter (HOSPITAL_BASED_OUTPATIENT_CLINIC_OR_DEPARTMENT_OTHER): Payer: Self-pay | Admitting: Orthopedic Surgery

## 2022-08-01 ENCOUNTER — Encounter (HOSPITAL_BASED_OUTPATIENT_CLINIC_OR_DEPARTMENT_OTHER)
Admission: RE | Disposition: A | Discharge status: Home / Self Care | Payer: Self-pay | Source: Home / Self Care | Attending: Orthopedic Surgery

## 2022-08-01 ENCOUNTER — Other Ambulatory Visit: Payer: Self-pay

## 2022-08-01 ENCOUNTER — Ambulatory Visit (HOSPITAL_BASED_OUTPATIENT_CLINIC_OR_DEPARTMENT_OTHER)
Admission: RE | Admit: 2022-08-01 | Discharge: 2022-08-01 | Disposition: A | Discharge status: Home / Self Care | Payer: Medicare Other | Attending: Orthopedic Surgery | Admitting: Orthopedic Surgery

## 2022-08-01 DIAGNOSIS — F418 Other specified anxiety disorders: Secondary | ICD-10-CM | POA: Diagnosis not present

## 2022-08-01 DIAGNOSIS — Z87891 Personal history of nicotine dependence: Secondary | ICD-10-CM | POA: Insufficient documentation

## 2022-08-01 DIAGNOSIS — S52502A Unspecified fracture of the lower end of left radius, initial encounter for closed fracture: Secondary | ICD-10-CM | POA: Diagnosis not present

## 2022-08-01 DIAGNOSIS — E119 Type 2 diabetes mellitus without complications: Secondary | ICD-10-CM | POA: Diagnosis not present

## 2022-08-01 DIAGNOSIS — I10 Essential (primary) hypertension: Secondary | ICD-10-CM

## 2022-08-01 DIAGNOSIS — X58XXXA Exposure to other specified factors, initial encounter: Secondary | ICD-10-CM | POA: Diagnosis not present

## 2022-08-01 DIAGNOSIS — S62102A Fracture of unspecified carpal bone, left wrist, initial encounter for closed fracture: Secondary | ICD-10-CM

## 2022-08-01 DIAGNOSIS — Z794 Long term (current) use of insulin: Secondary | ICD-10-CM | POA: Insufficient documentation

## 2022-08-01 DIAGNOSIS — Z7984 Long term (current) use of oral hypoglycemic drugs: Secondary | ICD-10-CM | POA: Diagnosis not present

## 2022-08-01 DIAGNOSIS — Z01818 Encounter for other preprocedural examination: Secondary | ICD-10-CM

## 2022-08-01 HISTORY — PX: ORIF WRIST FRACTURE: SHX2133

## 2022-08-01 HISTORY — DX: Age-related osteoporosis without current pathological fracture: M81.0

## 2022-08-01 HISTORY — DX: Unspecified osteoarthritis, unspecified site: M19.90

## 2022-08-01 HISTORY — DX: Long term (current) use of insulin: Z79.4

## 2022-08-01 HISTORY — DX: Major depressive disorder, single episode, unspecified: F32.9

## 2022-08-01 HISTORY — DX: Unspecified hemorrhoids: K64.9

## 2022-08-01 HISTORY — DX: Type 2 diabetes mellitus without complications: E11.9

## 2022-08-01 HISTORY — DX: Allergic rhinitis, unspecified: J30.9

## 2022-08-01 HISTORY — DX: Generalized anxiety disorder: F41.1

## 2022-08-01 HISTORY — DX: Personal history of malignant melanoma of skin: Z85.820

## 2022-08-01 HISTORY — DX: Essential (primary) hypertension: I10

## 2022-08-01 HISTORY — DX: Hyperlipidemia, unspecified: E78.5

## 2022-08-01 HISTORY — DX: Carpal tunnel syndrome, unspecified upper limb: G56.00

## 2022-08-01 LAB — GLUCOSE, CAPILLARY: Glucose-Capillary: 141 mg/dL — ABNORMAL HIGH (ref 70–99)

## 2022-08-01 LAB — POCT I-STAT, CHEM 8
BUN: 28 mg/dL — ABNORMAL HIGH (ref 8–23)
Calcium, Ion: 1.3 mmol/L (ref 1.15–1.40)
Chloride: 103 mmol/L (ref 98–111)
Creatinine, Ser: 0.5 mg/dL (ref 0.44–1.00)
Glucose, Bld: 133 mg/dL — ABNORMAL HIGH (ref 70–99)
HCT: 46 % (ref 36.0–46.0)
Hemoglobin: 15.6 g/dL — ABNORMAL HIGH (ref 12.0–15.0)
Potassium: 4.2 mmol/L (ref 3.5–5.1)
Sodium: 140 mmol/L (ref 135–145)
TCO2: 29 mmol/L (ref 22–32)

## 2022-08-01 SURGERY — OPEN REDUCTION INTERNAL FIXATION (ORIF) WRIST FRACTURE
Anesthesia: Monitor Anesthesia Care | Site: Wrist | Laterality: Left

## 2022-08-01 MED ORDER — FENTANYL CITRATE (PF) 100 MCG/2ML IJ SOLN: 25.0000 ug | INTRAMUSCULAR | Status: DC | PRN

## 2022-08-01 MED ORDER — PROMETHAZINE HCL 25 MG/ML IJ SOLN: 6.2500 mg | INTRAMUSCULAR | Status: DC | PRN

## 2022-08-01 MED ORDER — ACETAMINOPHEN 500 MG PO TABS
ORAL_TABLET | ORAL | Status: AC
  Filled 2022-08-01: qty 2

## 2022-08-01 MED ORDER — ASPIRIN 81 MG PO TBEC: 81.0000 mg | DELAYED_RELEASE_TABLET | Freq: Two times a day (BID) | ORAL | 0 refills | Status: DC

## 2022-08-01 MED ORDER — ROPIVACAINE HCL 5 MG/ML IJ SOLN
INTRAMUSCULAR | Status: DC | PRN
  Administered 2022-08-01: 30 mL via PERINEURAL

## 2022-08-01 MED ORDER — POVIDONE-IODINE 10 % EX SWAB: 2.0000 | Freq: Once | CUTANEOUS | Status: DC

## 2022-08-01 MED ORDER — DEXAMETHASONE SODIUM PHOSPHATE 10 MG/ML IJ SOLN: 8.0000 mg | Freq: Once | INTRAMUSCULAR | Status: DC

## 2022-08-01 MED ORDER — AMISULPRIDE (ANTIEMETIC) 5 MG/2ML IV SOLN: 10.0000 mg | Freq: Once | INTRAVENOUS | Status: DC | PRN

## 2022-08-01 MED ORDER — PROPOFOL 10 MG/ML IV BOLUS
INTRAVENOUS | Status: AC
  Filled 2022-08-01: qty 20

## 2022-08-01 MED ORDER — MELOXICAM 15 MG PO TABS: 15.0000 mg | ORAL_TABLET | Freq: Every day | ORAL | 0 refills | Status: DC

## 2022-08-01 MED ORDER — MIDAZOLAM HCL 2 MG/2ML IJ SOLN
INTRAMUSCULAR | Status: AC
  Filled 2022-08-01: qty 2

## 2022-08-01 MED ORDER — CEFAZOLIN SODIUM-DEXTROSE 2-4 GM/100ML-% IV SOLN
2.0000 g | INTRAVENOUS | Status: AC
  Administered 2022-08-01: 2 g via INTRAVENOUS

## 2022-08-01 MED ORDER — 0.9 % SODIUM CHLORIDE (POUR BTL) OPTIME
TOPICAL | Status: DC | PRN
  Administered 2022-08-01: 1000 mL

## 2022-08-01 MED ORDER — PROPOFOL 500 MG/50ML IV EMUL
INTRAVENOUS | Status: DC | PRN
  Administered 2022-08-01: 100 ug/kg/min via INTRAVENOUS

## 2022-08-01 MED ORDER — ACETAMINOPHEN 500 MG PO TABS
1000.0000 mg | ORAL_TABLET | Freq: Once | ORAL | Status: AC
  Administered 2022-08-01: 1000 mg via ORAL

## 2022-08-01 MED ORDER — HYDROCODONE-ACETAMINOPHEN 10-325 MG PO TABS: 1.0000 | ORAL_TABLET | Freq: Four times a day (QID) | ORAL | 0 refills | Status: DC | PRN

## 2022-08-01 MED ORDER — DEXAMETHASONE SODIUM PHOSPHATE 10 MG/ML IJ SOLN
INTRAMUSCULAR | Status: DC | PRN
  Administered 2022-08-01: 5 mg

## 2022-08-01 MED ORDER — ACETAMINOPHEN 500 MG PO TABS: 1000.0000 mg | ORAL_TABLET | Freq: Three times a day (TID) | ORAL | 0 refills | Status: DC | PRN

## 2022-08-01 MED ORDER — CEFAZOLIN SODIUM-DEXTROSE 2-4 GM/100ML-% IV SOLN
INTRAVENOUS | Status: AC
  Filled 2022-08-01: qty 100

## 2022-08-01 MED ORDER — ONDANSETRON 4 MG PO TBDP: 4.0000 mg | ORAL_TABLET | Freq: Two times a day (BID) | ORAL | 0 refills | Status: DC | PRN

## 2022-08-01 MED ORDER — MIDAZOLAM HCL 2 MG/2ML IJ SOLN
2.0000 mg | Freq: Once | INTRAMUSCULAR | Status: AC
  Administered 2022-08-01: 2 mg via INTRAVENOUS

## 2022-08-01 MED ORDER — FENTANYL CITRATE (PF) 100 MCG/2ML IJ SOLN
INTRAMUSCULAR | Status: AC
  Filled 2022-08-01: qty 2

## 2022-08-01 MED ORDER — FENTANYL CITRATE (PF) 100 MCG/2ML IJ SOLN
100.0000 ug | Freq: Once | INTRAMUSCULAR | Status: AC
  Administered 2022-08-01: 100 ug via INTRAVENOUS

## 2022-08-01 MED ORDER — ACETAMINOPHEN 500 MG PO TABS: 1000.0000 mg | ORAL_TABLET | Freq: Once | ORAL | Status: DC

## 2022-08-01 MED ORDER — LACTATED RINGERS IV SOLN: INTRAVENOUS | Status: DC

## 2022-08-01 SURGICAL SUPPLY — 72 items
APL PRP STRL LF DISP 70% ISPRP (MISCELLANEOUS) ×1
BIT DRILL 2.2 SS TIBIAL (BIT) IMPLANT
BLADE SURG 15 STRL LF DISP TIS (BLADE) ×4 IMPLANT
BLADE SURG 15 STRL SS (BLADE) ×2
BNDG CMPR 9X4 STRL LF SNTH (GAUZE/BANDAGES/DRESSINGS) ×1
BNDG ELASTIC 4X5.8 VLCR STR LF (GAUZE/BANDAGES/DRESSINGS) ×2 IMPLANT
BNDG ESMARK 4X9 LF (GAUZE/BANDAGES/DRESSINGS) ×2 IMPLANT
CHLORAPREP W/TINT 26 (MISCELLANEOUS) ×2 IMPLANT
CORD BIPOLAR FORCEPS 12FT (ELECTRODE) ×2 IMPLANT
COVER BACK TABLE 60X90IN (DRAPES) ×2 IMPLANT
CUFF TOURN SGL QUICK 18X4 (TOURNIQUET CUFF) IMPLANT
CUFF TOURN SGL QUICK 24 (TOURNIQUET CUFF)
CUFF TRNQT CYL 24X4X16.5-23 (TOURNIQUET CUFF) IMPLANT
DECANTER SPIKE VIAL GLASS SM (MISCELLANEOUS) IMPLANT
DRAPE C-ARM 35X43 STRL (DRAPES) ×2 IMPLANT
DRAPE EXTREMITY T 121X128X90 (DISPOSABLE) ×2 IMPLANT
DRAPE IMP U-DRAPE 54X76 (DRAPES) ×2 IMPLANT
DRAPE SURG 17X23 STRL (DRAPES) IMPLANT
DRSG EMULSION OIL 3X3 NADH (GAUZE/BANDAGES/DRESSINGS) ×2 IMPLANT
GAUZE 4X4 16PLY ~~LOC~~+RFID DBL (SPONGE) ×2 IMPLANT
GAUZE SPONGE 4X4 12PLY STRL (GAUZE/BANDAGES/DRESSINGS) ×2 IMPLANT
GAUZE XEROFORM 1X8 LF (GAUZE/BANDAGES/DRESSINGS) IMPLANT
GLOVE BIO SURGEON STRL SZ7.5 (GLOVE) ×4 IMPLANT
GLOVE BIOGEL PI IND STRL 7.5 (GLOVE) ×2 IMPLANT
GLOVE BIOGEL PI IND STRL 8 (GLOVE) ×4 IMPLANT
GLOVE SURG SS PI 7.5 STRL IVOR (GLOVE) ×2 IMPLANT
GOWN STRL REUS W/TWL LRG LVL3 (GOWN DISPOSABLE) ×2 IMPLANT
GOWN STRL REUS W/TWL XL LVL3 (GOWN DISPOSABLE) ×2 IMPLANT
K-WIRE 1.6 (WIRE) ×1
K-WIRE FX5X1.6XNS BN SS (WIRE) ×1
KIT TURNOVER CYSTO (KITS) ×2 IMPLANT
KWIRE FX5X1.6XNS BN SS (WIRE) IMPLANT
NDL HYPO 25X1 1.5 SAFETY (NEEDLE) ×2 IMPLANT
NEEDLE HYPO 25X1 1.5 SAFETY (NEEDLE) ×1 IMPLANT
NS IRRIG 1000ML POUR BTL (IV SOLUTION) ×2 IMPLANT
PACK BASIN DAY SURGERY FS (CUSTOM PROCEDURE TRAY) ×2 IMPLANT
PAD CAST 4YDX4 CTTN HI CHSV (CAST SUPPLIES) ×2 IMPLANT
PADDING CAST ABS COTTON 4X4 ST (CAST SUPPLIES) ×2 IMPLANT
PADDING CAST COTTON 4X4 STRL (CAST SUPPLIES) ×2
PEG LOCKING SMOOTH 2.2X16 (Screw) IMPLANT
PEG LOCKING SMOOTH 2.2X18 (Peg) IMPLANT
PEG LOCKING SMOOTH 2.2X20 (Screw) IMPLANT
PLATE STANDARD DVR LEFT (Plate) ×1 IMPLANT
PLATE STD DVR LT 24X51 (Plate) IMPLANT
SCREW  LP NL 2.7X13MM (Screw) ×1 IMPLANT
SCREW  LP NL 2.7X15MM (Screw) ×1 IMPLANT
SCREW  LP NL 2.7X16MM (Screw) ×1 IMPLANT
SCREW LP NL 2.7X13MM (Screw) IMPLANT
SCREW LP NL 2.7X15MM (Screw) IMPLANT
SCREW LP NL 2.7X16MM (Screw) IMPLANT
SLEEVE SCD COMPRESS KNEE MED (STOCKING) IMPLANT
SLING ARM FOAM STRAP LRG (SOFTGOODS) IMPLANT
SPLINT PLASTER CAST FAST 5X30 (CAST SUPPLIES) IMPLANT
SPLINT PLASTER CAST XFAST 3X15 (CAST SUPPLIES) IMPLANT
SPLINT PLASTER CAST XFAST 4X15 (CAST SUPPLIES) IMPLANT
SPONGE T-LAP 4X18 ~~LOC~~+RFID (SPONGE) ×2 IMPLANT
STRIP CLOSURE SKIN 1/2X4 (GAUZE/BANDAGES/DRESSINGS) ×2 IMPLANT
SUCTION FRAZIER HANDLE 10FR (MISCELLANEOUS)
SUCTION TUBE FRAZIER 10FR DISP (MISCELLANEOUS) IMPLANT
SUT ETHILON 3 0 PS 1 (SUTURE) IMPLANT
SUT MON AB 2-0 CT1 36 (SUTURE) ×2 IMPLANT
SUT MON AB 4-0 PC3 18 (SUTURE) IMPLANT
SUT PROLENE 3 0 PS 2 (SUTURE) IMPLANT
SUT VIC AB 0 SH 27 (SUTURE) IMPLANT
SUT VIC AB 2-0 SH 27 (SUTURE)
SUT VIC AB 2-0 SH 27XBRD (SUTURE) IMPLANT
SUT VIC AB 3-0 FS2 27 (SUTURE) IMPLANT
SYR BULB EAR ULCER 3OZ GRN STR (SYRINGE) ×2 IMPLANT
SYR CONTROL 10ML LL (SYRINGE) ×2 IMPLANT
TOWEL OR 17X26 10 PK STRL BLUE (TOWEL DISPOSABLE) ×2 IMPLANT
TUBE CONNECTING 12X1/4 (SUCTIONS) ×2 IMPLANT
UNDERPAD 30X36 HEAVY ABSORB (UNDERPADS AND DIAPERS) ×2 IMPLANT

## 2022-08-01 NOTE — Anesthesia Postprocedure Evaluation (Signed)
Anesthesia Post Note  Patient: Destiny Tucker  Procedure(s) Performed: OPEN REDUCTION INTERNAL FIXATION (ORIF) WRIST FRACTURE (Left: Wrist)     Patient location during evaluation: PACU Anesthesia Type: Regional Level of consciousness: awake and alert Pain management: pain level controlled Vital Signs Assessment: post-procedure vital signs reviewed and stable Respiratory status: spontaneous breathing and respiratory function stable Cardiovascular status: stable Postop Assessment: no apparent nausea or vomiting Anesthetic complications: no   No notable events documented.  Last Vitals:  Vitals:   08/01/22 1435 08/01/22 1445  BP: (!) 163/80 128/77  Pulse: 90 85  Resp: (!) 21 18  Temp: (!) 36.4 C   SpO2: 92% 92%    Last Pain:  Vitals:   08/01/22 1445  TempSrc:   PainSc: 0-No pain                 Bunnie Lederman DANIEL

## 2022-08-01 NOTE — Op Note (Signed)
08/01/2022  3:08 PM  PATIENT:  Destiny Tucker    PRE-OPERATIVE DIAGNOSIS:  LEFT WRIST FRACTURE  POST-OPERATIVE DIAGNOSIS:  Same  PROCEDURE:  OPEN REDUCTION INTERNAL FIXATION (ORIF) WRIST FRACTURE  SURGEON:  Renette Butters, MD  ASSISTANT: Aggie Moats, PA-C, he was present and scrubbed throughout the case, critical for completion in a timely fashion, and for retraction, instrumentation, and closure.   ANESTHESIA:   regional  PREOPERATIVE INDICATIONS:  [name] is a  66 y.o. female with a diagnosis of LEFT WRIST FRACTURE who failed conservative measures and elected for surgical management.    The risks benefits and alternatives were discussed with the patient preoperatively including but not limited to the risks of infection, bleeding, nerve injury, cardiopulmonary complications, the need for revision surgery, among others, and the patient was willing to proceed.  OPERATIVE IMPLANTS: DVR plate  OPERATIVE FINDINGS: unstable fracture, 3 part  BLOOD LOSS: min  COMPLICATIONS: none  TOURNIQUET TIME: 102mn  OPERATIVE PROCEDURE:  Patient was identified in the preoperative holding area and site was marked by me She was transported to the operating theater and placed on the table in supine position taking care to pad all bony prominences. After a preincinduction time out anesthesia was induced. The left upper extremity was prepped and draped in normal sterile fashion and a pre-incision timeout was performed. She received ancef for preoperative antibiotics.   I made a 5 cm incision centered over her FCR tendon and dissected down carefully to the level of the flexor tendon sheath and incise this longitudinally and retracted the FCR radially and incised the dorsal aspect of the sheath.   I bluntly dissected the FPL muscle belly away from the brachioradialis and then sharply incised the pronator tendon from the distal radius and from the wrist capsule. I Elevated this off the  bone the fractures visible.   I released the brachioradialis from its insertion. I then debrided the fracture and performed a manual reduction. There were at least 3 articular pieces of the fracture.  I selected a plate and I placed it on the bone. I pinned it into place and was happy on multiple radiographic views with it's placement. I then fixed the plate distally with the locking pegs. I confirmed no articular penetration with the pegs and that none were prominent dorsally.   I then reduced the plate to the shaft improving the volar and radial tilt of her distal radius.  I was happy with the final fluoro xrays. I reviewed more than 3 views of the wrist including obliques and ap/lat   I thoroughly irrigated the wound and closed the pronator over top of the plate and then closed the skin in layers with absorbable stitch. Sterile dressing was applied using the PACU in stable condition.  POST OPERATIVE PLAN: NWB, Splint full time. Ambulate for DVT px.

## 2022-08-01 NOTE — Progress Notes (Signed)
Assisted Dr. Tobias Alexander with left, supraclavicular, ultrasound guided block. Side rails up, monitors on throughout procedure. See vital signs in flow sheet. Tolerated Procedure well.

## 2022-08-01 NOTE — Transfer of Care (Signed)
Immediate Anesthesia Transfer of Care Note  Patient: Destiny Tucker  Procedure(s) Performed: Procedure(s) (LRB): OPEN REDUCTION INTERNAL FIXATION (ORIF) WRIST FRACTURE (Left)  Patient Location: PACU  Anesthesia Type: MAC  Level of Consciousness: awake, alert , oriented and patient cooperative  Airway & Oxygen Therapy: Patient Spontanous Breathing and Patient connected to face mask oxygen  Post-op Assessment: Report given to PACU RN and Post -op Vital signs reviewed and stable  Post vital signs: Reviewed and stable  Complications: No apparent anesthesia complications  Last Vitals:  Vitals Value Taken Time  BP 163/80 08/01/22 1435  Temp    Pulse 91 08/01/22 1437  Resp 17 08/01/22 1437  SpO2 94 % 08/01/22 1437  Vitals shown include unvalidated device data.  Last Pain:  Vitals:   08/01/22 1133  TempSrc: Oral  PainSc: 0-No pain      Patients Stated Pain Goal: 3 (93/90/30 0923)  Complications: No notable events documented.

## 2022-08-01 NOTE — Discharge Instructions (Addendum)
POST-OPERATIVE OPIOID TAPER INSTRUCTIONS: It is important to wean off of your opioid medication as soon as possible. If you do not need pain medication after your surgery it is ok to stop day one. Opioids include: Codeine, Hydrocodone(Norco, Vicodin), Oxycodone(Percocet, oxycontin) and hydromorphone amongst others.  Long term and even short term use of opiods can cause: Increased pain response Dependence Constipation Depression Respiratory depression And more.  Withdrawal symptoms can include Flu like symptoms Nausea, vomiting And more Techniques to manage these symptoms Hydrate well Eat regular healthy meals Stay active Use relaxation techniques(deep breathing, meditating, yoga) Do Not substitute Alcohol to help with tapering If you have been on opioids for less than two weeks and do not have pain than it is ok to stop all together.  Plan to wean off of opioids This plan should start within one week post op of your joint replacement. Maintain the same interval or time between taking each dose and first decrease the dose.  Cut the total daily intake of opioids by one tablet each day Next start to increase the time between doses. The last dose that should be eliminated is the evening dose.    Regional Anesthesia Blocks  1. Numbness or the inability to move the "blocked" extremity may last from 3-48 hours after placement. The length of time depends on the medication injected and your individual response to the medication. If the numbness is not going away after 48 hours, call your surgeon.  2. The extremity that is blocked will need to be protected until the numbness is gone and the  Strength has returned. Because you cannot feel it, you will need to take extra care to avoid injury. Because it may be weak, you may have difficulty moving it or using it. You may not know what position it is in without looking at it while the block is in effect.  3. For blocks in the legs and feet,  returning to weight bearing and walking needs to be done carefully. You will need to wait until the numbness is entirely gone and the strength has returned. You should be able to move your leg and foot normally before you try and bear weight or walk. You will need someone to be with you when you first try to ensure you do not fall and possibly risk injury.  4. Bruising and tenderness at the needle site are common side effects and will resolve in a few days.  5. Persistent numbness or new problems with movement should be communicated to the surgeon or the [facility] [phone] [place] [phone]).  Post Anesthesia Home Care Instructions  Activity: Get plenty of rest for the remainder of the day. A responsible individual must stay with you for 24 hours following the procedure.  For the next 24 hours, DO NOT: -Drive a car -Paediatric nurse -Drink alcoholic beverages -Take any medication unless instructed by your physician -Make any legal decisions or sign important papers.  Meals: Start with liquid foods such as gelatin or soup. Progress to regular foods as tolerated. Avoid greasy, spicy, heavy foods. If nausea and/or vomiting occur, drink only clear liquids until the nausea and/or vomiting subsides. Call your physician if vomiting continues.  Special Instructions/Symptoms: Your throat may feel dry or sore from the anesthesia or the breathing tube placed in your throat during surgery. If this causes discomfort, gargle with warm salt water. The discomfort should disappear within 24 hours.

## 2022-08-01 NOTE — Anesthesia Procedure Notes (Signed)
Anesthesia Regional Block: Supraclavicular block   Pre-Anesthetic Checklist: , timeout performed,  Correct Patient, Correct Site, Correct Laterality,  Correct Procedure, Correct Position, site marked,  Risks and benefits discussed,  Surgical consent,  Pre-op evaluation,  At surgeon's request and post-op pain management  Laterality: Left  Prep: chloraprep       Needles:  Injection technique: Single-shot  Needle Type: Echogenic Stimulator Needle     Needle Length: 5cm  Needle Gauge: 22     Additional Needles:   Narrative:  Start time: 08/01/2022 12:01 PM End time: 08/01/2022 12:11 PM Injection made incrementally with aspirations every 5 mL.  Performed by: Personally  Anesthesiologist: Duane Boston, MD  Additional Notes: Functioning IV was confirmed and monitors applied.  A 82m 22ga echogenic arrow stimulator was used. Sterile prep and drape,hand hygiene and sterile gloves were used.Ultrasound guidance: relevant anatomy identified, needle position confirmed, local anesthetic spread visualized around nerve(s)., vascular puncture avoided.  Image printed for medical record.  Negative aspiration and negative test dose prior to incremental administration of local anesthetic. The patient tolerated the procedure well.

## 2022-08-01 NOTE — Anesthesia Preprocedure Evaluation (Addendum)
Anesthesia Evaluation  Patient identified by MRN, date of birth, ID band Patient awake    Reviewed: Allergy & Precautions, NPO status , Patient's Chart, lab work & pertinent test results  History of Anesthesia Complications Negative for: history of anesthetic complications  Airway Mallampati: III  TM Distance: >3 FB Neck ROM: Full    Dental  (+) Caps, Dental Advisory Given   Pulmonary neg pulmonary ROS, former smoker,    Pulmonary exam normal        Cardiovascular hypertension, Pt. on medications Normal cardiovascular exam     Neuro/Psych PSYCHIATRIC DISORDERS Anxiety Depression negative neurological ROS     GI/Hepatic negative GI ROS, Neg liver ROS,   Endo/Other  diabetes  Renal/GU negative Renal ROS     Musculoskeletal negative musculoskeletal ROS (+)   Abdominal   Peds  Hematology negative hematology ROS (+)   Anesthesia Other Findings   Reproductive/Obstetrics                            Anesthesia Physical Anesthesia Plan  ASA: 2  Anesthesia Plan: Regional and MAC   Post-op Pain Management: Regional block* and Tylenol PO (pre-op)*   Induction: Intravenous  PONV Risk Score and Plan: 2 and Ondansetron and Midazolam  Airway Management Planned: Natural Airway and Simple Face Mask  Additional Equipment:   Intra-op Plan:   Post-operative Plan:   Informed Consent: I have reviewed the patients History and Physical, chart, labs and discussed the procedure including the risks, benefits and alternatives for the proposed anesthesia with the patient or authorized representative who has indicated his/her understanding and acceptance.     Dental advisory given  Plan Discussed with: Anesthesiologist and CRNA  Anesthesia Plan Comments:        Anesthesia Quick Evaluation

## 2022-08-01 NOTE — Interval H&P Note (Signed)
History and Physical Interval Note:  08/01/2022 11:26 AM  Destiny Tucker  has presented today for surgery, with the diagnosis of LEFT WRIST FRACTURE.  The various methods of treatment have been discussed with the patient and family. After consideration of risks, benefits and other options for treatment, the patient has consented to  Procedure(s): OPEN REDUCTION INTERNAL FIXATION (ORIF) WRIST FRACTURE (Left) as a surgical intervention.  The patient's history has been reviewed, patient examined, no change in status, stable for surgery.  I have reviewed the patient's chart and labs.  Questions were answered to the patient's satisfaction.     Renette Butters

## 2022-08-02 ENCOUNTER — Encounter (HOSPITAL_BASED_OUTPATIENT_CLINIC_OR_DEPARTMENT_OTHER): Payer: Self-pay | Admitting: Orthopedic Surgery

## 2022-08-11 DIAGNOSIS — S52502D Unspecified fracture of the lower end of left radius, subsequent encounter for closed fracture with routine healing: Secondary | ICD-10-CM | POA: Diagnosis not present

## 2022-09-11 DIAGNOSIS — S52502D Unspecified fracture of the lower end of left radius, subsequent encounter for closed fracture with routine healing: Secondary | ICD-10-CM | POA: Diagnosis not present

## 2022-10-02 DIAGNOSIS — H02831 Dermatochalasis of right upper eyelid: Secondary | ICD-10-CM | POA: Diagnosis not present

## 2022-10-02 DIAGNOSIS — H52203 Unspecified astigmatism, bilateral: Secondary | ICD-10-CM | POA: Diagnosis not present

## 2022-10-02 DIAGNOSIS — E119 Type 2 diabetes mellitus without complications: Secondary | ICD-10-CM | POA: Diagnosis not present

## 2022-10-02 DIAGNOSIS — H02834 Dermatochalasis of left upper eyelid: Secondary | ICD-10-CM | POA: Diagnosis not present

## 2022-10-02 DIAGNOSIS — H2513 Age-related nuclear cataract, bilateral: Secondary | ICD-10-CM | POA: Diagnosis not present

## 2022-10-23 DIAGNOSIS — L570 Actinic keratosis: Secondary | ICD-10-CM | POA: Diagnosis not present

## 2022-10-25 DIAGNOSIS — L900 Lichen sclerosus et atrophicus: Secondary | ICD-10-CM | POA: Diagnosis not present

## 2022-10-25 DIAGNOSIS — L293 Anogenital pruritus, unspecified: Secondary | ICD-10-CM | POA: Diagnosis not present

## 2022-12-12 DIAGNOSIS — E1169 Type 2 diabetes mellitus with other specified complication: Secondary | ICD-10-CM | POA: Diagnosis not present

## 2022-12-12 DIAGNOSIS — E7800 Pure hypercholesterolemia, unspecified: Secondary | ICD-10-CM | POA: Diagnosis not present

## 2022-12-12 DIAGNOSIS — I10 Essential (primary) hypertension: Secondary | ICD-10-CM | POA: Diagnosis not present

## 2022-12-22 DIAGNOSIS — E7800 Pure hypercholesterolemia, unspecified: Secondary | ICD-10-CM | POA: Diagnosis not present

## 2022-12-22 DIAGNOSIS — M1990 Unspecified osteoarthritis, unspecified site: Secondary | ICD-10-CM | POA: Diagnosis not present

## 2022-12-22 DIAGNOSIS — E119 Type 2 diabetes mellitus without complications: Secondary | ICD-10-CM | POA: Diagnosis not present

## 2022-12-22 DIAGNOSIS — R2689 Other abnormalities of gait and mobility: Secondary | ICD-10-CM | POA: Diagnosis not present

## 2022-12-22 DIAGNOSIS — Z0000 Encounter for general adult medical examination without abnormal findings: Secondary | ICD-10-CM | POA: Diagnosis not present

## 2022-12-22 DIAGNOSIS — Z9181 History of falling: Secondary | ICD-10-CM | POA: Diagnosis not present

## 2023-01-05 DIAGNOSIS — R2689 Other abnormalities of gait and mobility: Secondary | ICD-10-CM | POA: Diagnosis not present

## 2023-01-23 DIAGNOSIS — R2689 Other abnormalities of gait and mobility: Secondary | ICD-10-CM | POA: Diagnosis not present

## 2023-01-29 ENCOUNTER — Ambulatory Visit (INDEPENDENT_AMBULATORY_CARE_PROVIDER_SITE_OTHER): Payer: 59 | Admitting: Obstetrics and Gynecology

## 2023-01-29 ENCOUNTER — Other Ambulatory Visit (HOSPITAL_COMMUNITY)
Admission: RE | Admit: 2023-01-29 | Discharge: 2023-01-29 | Disposition: A | Discharge status: Home / Self Care | Payer: 59 | Source: Ambulatory Visit | Attending: Obstetrics and Gynecology | Admitting: Obstetrics and Gynecology

## 2023-01-29 ENCOUNTER — Encounter: Payer: Self-pay | Admitting: Obstetrics and Gynecology

## 2023-01-29 VITALS — BP 114/78 | HR 90 | Ht 62.8 in | Wt 221.8 lb

## 2023-01-29 DIAGNOSIS — B379 Candidiasis, unspecified: Secondary | ICD-10-CM

## 2023-01-29 DIAGNOSIS — N760 Acute vaginitis: Secondary | ICD-10-CM | POA: Diagnosis not present

## 2023-01-29 DIAGNOSIS — N3281 Overactive bladder: Secondary | ICD-10-CM

## 2023-01-29 DIAGNOSIS — N393 Stress incontinence (female) (male): Secondary | ICD-10-CM | POA: Diagnosis not present

## 2023-01-29 DIAGNOSIS — B9689 Other specified bacterial agents as the cause of diseases classified elsewhere: Secondary | ICD-10-CM

## 2023-01-29 DIAGNOSIS — R350 Frequency of micturition: Secondary | ICD-10-CM

## 2023-01-29 DIAGNOSIS — N898 Other specified noninflammatory disorders of vagina: Secondary | ICD-10-CM | POA: Diagnosis present

## 2023-01-29 LAB — POCT URINALYSIS DIPSTICK
Bilirubin, UA: NEGATIVE
Blood, UA: NEGATIVE
Glucose, UA: POSITIVE — AB
Ketones, UA: NEGATIVE
Leukocytes, UA: NEGATIVE
Nitrite, UA: NEGATIVE
Protein, UA: NEGATIVE
Spec Grav, UA: >=1.03 — AB (ref 1.010–1.025)
Urobilinogen, UA: 0.2 E.U./dL
pH, UA: 5.5 (ref 5.0–8.0)

## 2023-01-29 MED ORDER — MIRABEGRON ER 25 MG PO TB24: 25.0000 mg | ORAL_TABLET | Freq: Every day | ORAL | 5 refills | Status: AC

## 2023-01-29 NOTE — Patient Instructions (Addendum)
Today we talked about ways to manage bladder urgency such as altering your diet to avoid irritative beverages and foods (bladder diet) as well as attempting to decrease stress and other exacerbating factors.    Avoid caffeine in the afternoon and evening. Avoid drinking 2-3 hours prior to bedtime.   The Most Bothersome Foods* The Least Bothersome Foods*  Coffee - Regular & Decaf Tea - caffeinated Carbonated beverages - cola, non-colas, diet & caffeine-free Alcohols - Beer, Red Wine, White Wine, Champagne Fruits - Grapefruit, [place], Orange, [employer] - Cranberry, Grapefruit, Orange, Pineapple Vegetables - Tomato & Tomato Products Flavor Enhancers - Hot peppers, Spicy foods, Chili, Horseradish, Vinegar, Monosodium glutamate (MSG) Artificial Sweeteners - NutraSweet, Sweet 'N Low, Equal (sweetener), Saccharin Ethnic foods - [place], [place], [place] food Express Scripts - low-fat & whole Fruits - Bananas, Blueberries, Honeydew melon, Pears, Raisins, Watermelon Vegetables - Broccoli, Brussels Sprouts, [place], Carrots, Cauliflower, [place], Cucumber, Mushrooms, Peas, Radishes, Squash, Zucchini, White potatoes, Sweet potatoes & yams Poultry - Chicken, Eggs, [place], [employer] - Beef, Programmer, multimedia, Lamb Seafood - Shrimp, [place] fish, Salmon Grains - Oat, Rice Snacks - Pretzels, Popcorn  *Lissa Morales et al. Diet and its role in interstitial cystitis/bladder pain syndrome (IC/BPS) and comorbid conditions. [place] 2012 Jan 11.    We discussed the symptoms of overactive bladder (OAB), which include urinary urgency, urinary frequency, night-time urination, with or without urge incontinence.  We discussed management including behavioral therapy (decreasing bladder irritants by following a bladder diet, urge suppression strategies, timed voids, bladder retraining), physical therapy, medication; and for refractory cases posterior tibial nerve stimulation, sacral neuromodulation, and intravesical  botulinum toxin injection.   For Beta-3 agonist medication, we discussed the potential side effect of elevated blood pressure which is more likely to occur in individuals with uncontrolled hypertension. You were given an Rx for Myrbetriq 25 mg.  It can take a month to start working so give it time, but if you have bothersome side effects call sooner and we can try a different medication.  Call us if you have trouble filling the prescription or if it's not covered by your insurance.   For treatment of stress urinary incontinence, which is leakage with physical activity/movement/strainging/coughing, we discussed expectant management versus nonsurgical options versus surgery. Nonsurgical options include weight loss, physical therapy, as well as a pessary.  Surgical options include a midurethral sling, which is a synthetic mesh sling that acts like a hammock under the urethra to prevent leakage of urine, a Burch urethropexy, and transurethral injection of a bulking agent.

## 2023-01-29 NOTE — Progress Notes (Signed)
[facility] New Patient Evaluation and Consultation  Referring Provider: Tyson Dense, * PCP: Bartholome Bill, MD Date of Service: 01/29/2023  SUBJECTIVE Chief Complaint: New Patient (Initial Visit) Destiny Tucker is a 67 y.o. female is here for urinary incontinence. )  History of Present Illness: Destiny Tucker is a 67 y.o. White or Caucasian female seen in consultation at the request of Dr. Royston Sinner for evaluation of incontinence.    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 She reports she had such significant skin inflammation that she could not tolerate a pelvic during the time. She has seen a dermatologist regarding this and they believe it may be psoriasis.   Review of records significant for: Has had worsening incontinence over the last two years.   A1c 01/31/22 was 6.0  Urinary Symptoms: Leaks urine with cough/ sneeze, going from sitting to standing, with a full bladder, with movement to the bathroom, and with urgency. UUI > SUI.  Leaks 3 time(s) per days.  Pad use: Does not wear pads   She is bothered by her UI symptoms. Reports that she had urodynamic testing several years ago and it did not show leakage with coughing.   Day time voids 10.  Nocturia: 3-4 times per night to void. Voiding dysfunction: she does not empty her bladder well.  does not use a catheter to empty bladder.  When urinating, she feels a weak stream and difficulty starting urine stream Drinks: Occasional Orange juice in AM, Drinks 12 oz [place] sweet tea with dinner,  24 oz bottled water per day   UTIs: 0 UTI's in the last year.   Denies history of blood in urine, kidney or bladder stones, pyelonephritis, bladder cancer, and kidney cancer  Pelvic Organ Prolapse Symptoms:                  She Denies a feeling of a bulge the vaginal area.  Bowel Symptom: Bowel movements: 1 time(s) per day Stool consistency:  soft  Straining: no.  Splinting: no.  Incomplete evacuation: no.  She Denies accidental bowel leakage / fecal incontinence Bowel regimen: diet Last colonoscopy: Date 2014, Results No abnormalities   Sexual Function Sexually active: no.  Sexual orientation: Straight Pain with sex: No  Pelvic Pain Denies pelvic pain  Past Medical History:  Past Medical History:  Diagnosis Date   Allergic rhinitis    Carpal tunnel syndrome    GAD (generalized anxiety disorder)    Genital herpes    Hemorrhoids    History of low-risk melanoma    per pt excision rectal/ perineal area (moh's per pt) yrs ago   History of SCC (squamous cell carcinoma) of skin 2010   Hyperlipidemia    Hypertension    Internal hemorrhoids without mention of complication    Left wrist fracture 07/2022   MDD (major depressive disorder)    OA (osteoarthritis)    Osteoporosis    Type 2 diabetes mellitus treated with insulin [facility])    endocrinologist-- jamie huffman np;   checks blood sugar w/ Elenor Legato,  (07-28-2022 pt stated fasting blood sugar 130--140)     Past Surgical History:   Past Surgical History:  Procedure Laterality Date   LAPAROSCOPIC SALPINGO OOPHERECTOMY Left 1983   LAPAROSCOPIC SALPINGOOPHERECTOMY Right 09/21/2004   '@[facility]'$   by dr Gaetano Net;   EXTENSIVE LYSIS ADHESIONS AND D&C HYSTEROSCOPY   MOHS SURGERY     per pt yrs ago  rectal / perineal area   ORIF WRIST FRACTURE Left 08/01/2022   Procedure: OPEN REDUCTION INTERNAL FIXATION (ORIF) WRIST FRACTURE;  Surgeon: Renette Butters, MD;  Location: [facility];  Service: Orthopedics;  Laterality: Left;     Past OB/GYN History: G1 P0 Vaginal deliveries: 0,  Forceps/ Vacuum deliveries: 0, Cesarean section: 0 Menopausal: Yes, at age 53 Last pap smear was 11/2022.  Any history of abnormal pap smears: yes.   Medications: She has a current medication list which includes the following prescription(s): aspirin ec, biotin, calcium carbonate-vit d-min,  vitamin d-3, clonazepam, coenzyme q10, empagliflozin, hydrochlorothiazide, hydrocodone-acetaminophen, insulin pen needle, insulin pen needle, humulin r, losartan, lovastatin, meloxicam, metformin, mirabegron er, olopatadine hcl, ondansetron, probiotic daily, ozempic (1 mg/dose), and sertraline.   Allergies: Patient is allergic to codeine and sulfa antibiotics.   Social History:  Social History   Tobacco Use   Smoking status: Former    Years: 25.00    Types: Cigarettes    Quit date: 2009    Years since quitting: 15.2   Smokeless tobacco: Never  Vaping Use   Vaping Use: Never used  Substance Use Topics   Alcohol use: Not Currently    Comment: rare   Drug use: Never    Relationship status: married She lives with husband.   She is not employed. Regular exercise: Yes: does water aerobics 2-3 times weekly History of abuse: No  Family History:   Family History  Problem Relation Age of Onset   Dementia Mother    Alzheimer's disease Mother    Nephrolithiasis Father    Hypertension Father    Hyperlipidemia Father    Stroke Father    Aortic aneurysm Father    Diabetes Maternal Uncle      Review of Systems: Review of Systems  Constitutional:  Negative for fever, malaise/fatigue and weight loss.  Respiratory:  Positive for wheezing. Negative for cough and shortness of breath.   Cardiovascular:  Negative for chest pain, palpitations and leg swelling.  Gastrointestinal:  Positive for abdominal pain. Negative for blood in stool.  Genitourinary:  Negative for dysuria.  Musculoskeletal:  Negative for myalgias.  Skin:  Negative for rash.  Neurological:  Positive for dizziness, weakness and headaches.  Endo/Heme/Allergies:  Does not bruise/bleed easily.  Psychiatric/Behavioral:  Negative for depression. The patient is not nervous/anxious.      OBJECTIVE Physical Exam: Vitals:   01/29/23 1411  BP: 114/78  Pulse: 90  Weight: 221 lb 12.8 oz (100.6 kg)  Height: 5' 2.8" (1.595  m)    Physical Exam Constitutional:      General: She is not in acute distress.    Appearance: She is obese.  Pulmonary:     Effort: Pulmonary effort is normal.  Abdominal:     General: There is no distension.     Palpations: Abdomen is soft.     Tenderness: There is no abdominal tenderness. There is no rebound.  Musculoskeletal:        General: No swelling. Normal range of motion.  Skin:    General: Skin is warm and dry.     Findings: No rash.  Neurological:     Mental Status: She is alert and oriented to person, place, and time.  Psychiatric:        Mood and Affect: Mood normal.        Behavior: Behavior normal.      GU / Detailed Urogynecologic Evaluation:  Pelvic Exam: Normal external female genitalia; Bartholin's and Skene's  glands normal in appearance; urethral meatus normal in appearance, no urethral masses or discharge.   CST: negative  White discharge noted at inner vulva and introitus, aptima swab obtained.   Split speculum exam performed due to patient discomfort. Speculum exam reveals normal vaginal mucosa with atrophy. Did not visualize cervix.    POP-Q:  Deferred, no prolapse   Rectal Exam:  Normal external rectum  Post-Void Residual (PVR) by Bladder Scan: In order to evaluate bladder emptying, we discussed obtaining a postvoid residual and she agreed to this procedure.  Procedure: The ultrasound unit was placed on the patient's abdomen in the suprapubic region after the patient had voided. A PVR of 34 ml was obtained by bladder scan.  Laboratory Results: POC urine: positive glucose, otherwise negative   ASSESSMENT AND PLAN Destiny Tucker is a 67 y.o. with:  1. Overactive bladder   2. Urinary frequency   3. Vaginal discharge   4. SUI (stress urinary incontinence, female)    OAB - We discussed the symptoms of overactive bladder (OAB), which include urinary urgency, urinary frequency, nocturia, with or without urge incontinence.  While we do not  know the exact etiology of OAB, several treatment options exist. We discussed management including behavioral therapy (decreasing bladder irritants, urge suppression strategies, timed voids, bladder retraining), physical therapy, medication; for refractory cases posterior tibial nerve stimulation, sacral neuromodulation, and intravesical botulinum toxin injection.  - Discussed eliminating bladder irritants. Avoid caffeine in the afternoon and evening. Avoid drinking 2-3 hours prior to bedtime.  - Prescribed Myrbetriq '25mg'$  daily. For Beta-3 agonist medication, we discussed the potential side effect of elevated blood pressure which is more likely to occur in individuals with uncontrolled hypertension.  2. SUI - For treatment of stress urinary incontinence,  non-surgical options include expectant management, weight loss, physical therapy, as well as a pessary.  Surgical options include a midurethral sling, Burch urethropexy, and transurethral injection of a bulking agent. - Not as bothersome to her at this time. Will focus on treatment for OAB then can reassess.   3. Vaginal discharge - aptima swab obtained.   Return 6 weeks for follow up  Jaquita Folds, MD

## 2023-01-30 LAB — CERVICOVAGINAL ANCILLARY ONLY
Bacterial Vaginitis (gardnerella): POSITIVE — AB
Candida Glabrata: POSITIVE — AB
Candida Vaginitis: NEGATIVE
Comment: NEGATIVE
Comment: NEGATIVE
Comment: NEGATIVE

## 2023-01-30 MED ORDER — METRONIDAZOLE 500 MG PO TABS: 500.0000 mg | ORAL_TABLET | Freq: Two times a day (BID) | ORAL | 0 refills | Status: AC

## 2023-01-30 MED ORDER — FLUCONAZOLE 150 MG PO TABS: 150.0000 mg | ORAL_TABLET | Freq: Once | ORAL | 0 refills | Status: AC

## 2023-01-30 NOTE — Addendum Note (Signed)
Addended by: Jaquita Folds on: 01/30/2023 03:28 PM   Modules accepted: Orders

## 2023-01-30 NOTE — Progress Notes (Signed)
Patient has been notified

## 2023-02-08 DIAGNOSIS — R29890 Loss of height: Secondary | ICD-10-CM | POA: Diagnosis not present

## 2023-02-08 DIAGNOSIS — Z8262 Family history of osteoporosis: Secondary | ICD-10-CM | POA: Diagnosis not present

## 2023-02-12 DIAGNOSIS — Z1211 Encounter for screening for malignant neoplasm of colon: Secondary | ICD-10-CM | POA: Diagnosis not present

## 2023-02-22 DIAGNOSIS — Z1231 Encounter for screening mammogram for malignant neoplasm of breast: Secondary | ICD-10-CM | POA: Diagnosis not present

## 2023-03-15 ENCOUNTER — Ambulatory Visit: Payer: 59 | Admitting: Obstetrics and Gynecology

## 2023-04-13 DIAGNOSIS — L0390 Cellulitis, unspecified: Secondary | ICD-10-CM | POA: Diagnosis not present

## 2023-04-13 DIAGNOSIS — E119 Type 2 diabetes mellitus without complications: Secondary | ICD-10-CM | POA: Diagnosis not present

## 2023-06-20 ENCOUNTER — Ambulatory Visit (INDEPENDENT_AMBULATORY_CARE_PROVIDER_SITE_OTHER): Payer: 59 | Admitting: Clinical

## 2023-06-20 DIAGNOSIS — F32A Depression, unspecified: Secondary | ICD-10-CM

## 2023-06-20 DIAGNOSIS — F419 Anxiety disorder, unspecified: Secondary | ICD-10-CM | POA: Diagnosis not present

## 2023-06-20 NOTE — Progress Notes (Unsigned)
[facility] Counselor Initial Adult Exam  Name: Destiny Tucker Date: 06/20/2023 MRN: [id number] DOB: [birth year] PCP: Verlon Au, MD  Time spent: 1:31pm - 2:30pm   Guardian/Payee:  NA    Paperwork requested:  NA  Reason for Visit /Presenting Problem: Patient reported she has been searching for a therapist for several months. Patient stated, "my main issue is coping skills with things".  Mental Status Exam: Appearance:   Neat and Well Groomed     Behavior:  Appropriate  Motor:  Normal  Speech/Language:   Clear and Coherent  Affect:  Appropriate  Mood:  normal  Thought process:  tangential  Thought content:    Tangential  Sensory/Perceptual disturbances:    WNL  Orientation:  oriented to person, place, and situation  Attention:  Good  Concentration:  Good  Memory:  WNL  Fund of knowledge:   Good  Insight:    Good  Judgment:   Good  Impulse Control:  Good   Reported Symptoms:  Patient stated, "situational anxiety on airplanes", "I would cry at the drop of the hat" in response to the loss of her sister. Patient reported a history of severe anxiety and decreased appetite. Patient reported situational anxiety when in a large crowd or in an airplane. Patient reported a history of depression and reported depressed mood at times. Patient stated, "I'm not in a happy marriage" and patient reported she is the caregiver for her husband who is immobile.   Risk Assessment: Danger to Self:  No Patient denied current and past suicidal ideation and symptoms of psychosis Self-injurious Behavior: No Danger to Others: No Patient denied current and past homicidal ideation Duty to Warn:no Physical Aggression / Violence:No  Access to Firearms a concern: No  Gang Involvement:No  Patient / guardian was educated about steps to take if suicide or homicide risk level increases between visits: yes While future psychiatric events cannot be accurately predicted, the patient  does not currently require acute inpatient psychiatric care and does not currently meet [facility] involuntary commitment criteria.  Substance Abuse History: Current substance abuse: Yes   Patient reported a history of tobacco use but stopped several years ago. Patient reported drinking alcohol at most once a year. Patient reported no current or past drug use.   Past Psychiatric History:   Previous psychological history is significant for anxiety Outpatient Providers: Patient reported a history of medication management with a psychiatrist, Emerson Monte. Patient reported she previously participated in therapy with Evalina Field with [facility]. Patient reported she attended several group sessions through the [facility]  History of Psych Hospitalization: No  Psychological Testing:  none    Abuse History:  Victim of: No.,  none    Report needed: No. Victim of Neglect:No. Perpetrator of  none   Witness / Exposure to Domestic Violence: No   Protective Services Involvement: No  Witness to [employer] Violence:  No   Family History:  Family History  Problem Relation Age of Onset   Dementia Mother    Alzheimer's disease Mother    Nephrolithiasis Father    Hypertension Father    Hyperlipidemia Father    Stroke Father    Aortic aneurysm Father    Diabetes Maternal Uncle   Schizophrenia - nephew, per patient Family history of depression, per patient  Living situation: the patient lives with their spouse  Sexual Orientation: Straight  Relationship Status: married 40 years Name of spouse / other: Romeo Apple  If a parent, number  of children / ages: 0  Support Systems: friends, spouse  Surveyor, quantity Stress:  No   Income/Employment/Disability: retired  Financial planner: No   Educational History: Education: high school diploma/GED  Religion/Sprituality/[employer]: Baptist   Any cultural differences that may affect / interfere with treatment:  not applicable    Recreation/Hobbies: attends the [employer] and swims  Stressors: Loss of patient's sister in August 2023   Marital or family conflict   Other: family dynamics and patient's relationship with her nephew (son of patient's sister who passed away)   Husband's health concerns and is caregiver for patient's husband  Strengths: going to the [employer], swimming  Barriers:  none reported   Legal History: Pending legal issue / charges: The patient has no significant history of legal issues. History of legal issue / charges:  none  Medical History/Surgical History: reviewed Past Medical History:  Diagnosis Date   Allergic rhinitis    Carpal tunnel syndrome    GAD (generalized anxiety disorder)    Genital herpes    Hemorrhoids    History of low-risk melanoma    per pt excision rectal/ perineal area (moh's per pt) yrs ago   History of SCC (squamous cell carcinoma) of skin 2010   Hyperlipidemia    Hypertension    Internal hemorrhoids without mention of complication    Left wrist fracture 07/2022   MDD (major depressive disorder)    OA (osteoarthritis)    Osteoporosis    Type 2 diabetes mellitus treated with insulin [facility])    endocrinologist-- jamie huffman np;   checks blood sugar w/ Josephine Igo,  (07-28-2022 pt stated fasting blood sugar 130--140)    Past Surgical History:  Procedure Laterality Date   LAPAROSCOPIC SALPINGO OOPHERECTOMY Left 1983   LAPAROSCOPIC SALPINGOOPHERECTOMY Right 09/21/2004   @[facility]   by dr Henderson Cloud;   EXTENSIVE LYSIS ADHESIONS AND D&C HYSTEROSCOPY   MOHS SURGERY     per pt yrs ago rectal / perineal area   ORIF WRIST FRACTURE Left 08/01/2022   Procedure: OPEN REDUCTION INTERNAL FIXATION (ORIF) WRIST FRACTURE;  Surgeon: Sheral Apley, MD;  Location[facility];  Service: Orthopedics;  Laterality: Left;    Medications: Current Outpatient Medications  Medication Sig Dispense Refill   aspirin EC 81 MG tablet Take 1 tablet (81 mg total) by mouth 2 (two) times  daily. To prevent blood clots for 30 days after surgery. 60 tablet 0   BIOTIN PO Take by mouth at bedtime.     Calcium Carbonate-Vit D-Min (CALCIUM 1200 PO) Take by mouth at bedtime.     Cholecalciferol (VITAMIN D-3) 25 MCG (1000 UT) CAPS Take 1 capsule by mouth at bedtime.     clonazePAM (KLONOPIN) 1 MG tablet Take 1 tablet (1 mg total) by mouth at bedtime. for anxiety (Patient taking differently: Take 1 mg by mouth 2 (two) times daily as needed. for anxiety) 60 tablet 0   Coenzyme Q10 (COQ10 PO) Take by mouth at bedtime.     empagliflozin (JARDIANCE) 25 MG TABS tablet Take 25 mg by mouth daily.     hydrochlorothiazide (HYDRODIURIL) 12.5 MG tablet TAKE 1 TABLET BY MOUTH ONCE DAILY (Patient taking differently: Take 12.5 mg by mouth daily.) 90 tablet 2   HYDROcodone-acetaminophen (NORCO) 10-325 MG tablet Take 1 tablet by mouth every 6 (six) hours as needed for severe pain. 28 tablet 0   Insulin Pen Needle (NOVOFINE) 32G X 6 MM MISC Use to inject 2 times per day 300 each 2   Insulin  Pen Needle 31G X 8 MM MISC by Does not apply route.     insulin regular human CONCENTRATED (HUMULIN R) 500 UNIT/ML injection Inject 30 Units into the skin 2 (two) times daily. Pt has pen with U500/ 3ml     losartan (COZAAR) 50 MG tablet Take 1 tablet (50 mg total) by mouth daily. (Patient taking differently: Take 50 mg by mouth daily.) 90 tablet 0   lovastatin (MEVACOR) 20 MG tablet Take 1 tablet (20 mg total) by mouth daily with breakfast. (Patient taking differently: Take 20 mg by mouth at bedtime.) 90 tablet 1   meloxicam (MOBIC) 15 MG tablet Take 1 tablet (15 mg total) by mouth daily. For pain and inflammation 30 tablet 0   metFORMIN (GLUCOPHAGE) 1000 MG tablet TAKE ONE TABLET BY MOUTH TWICE DAILY WITH FOOD (Patient taking differently: Take 1,000 mg by mouth at bedtime.) 180 tablet 2   mirabegron ER (MYRBETRIQ) 25 MG TB24 tablet Take 1 tablet (25 mg total) by mouth daily. 30 tablet 5   Olopatadine HCl (PATADAY OP)  Apply to eye as needed.     ondansetron (ZOFRAN-ODT) 4 MG disintegrating tablet Take 1 tablet (4 mg total) by mouth 2 (two) times daily as needed for nausea or vomiting. 10 tablet 0   Probiotic Product (PROBIOTIC DAILY) CAPS Take by mouth at bedtime.     Semaglutide, 1 MG/DOSE, (OZEMPIC, 1 MG/DOSE,) 4 MG/3ML SOPN Inject 1 mg into the skin once a week. Friday's     sertraline (ZOLOFT) 100 MG tablet Take 1.5 tablets (150 mg total) by mouth daily. (Patient taking differently: Take 150 mg by mouth at bedtime.) 135 tablet 3   No current facility-administered medications for this visit.  Patient reported she does not currently take aspirin.   Allergies  Allergen Reactions   Codeine Nausea And Vomiting   Sulfa Antibiotics     Other Reaction(s): GI Intolerance    Diagnoses:  Depression, unspecified depression type  Anxiety disorder, unspecified type  Plan of Care: Patient is a 67 year old female who presented for an initial assessment. Clinician conducted session via caregility video from clinician's home office. Patient provided verbal consent to proceed with telehealth session and is aware of limitations of telephone or video visits. Patient participated in session from patient's home. Patient stated, "my main issue is coping skills with things" when clinician inquired about reason for today's visit. Patient reported the following symptoms: crying in response to the loss of her sister, situational anxiety when in a large crowd or in an airplane, and depressed mood at times. Patient reported a history of severe anxiety, decreased appetite, and depression. Patient denied current and past suicidal ideation, homicidal ideation, and symptoms of psychosis. Patient reported current alcohol use at most once a year. Patient reported a history of tobacco use. Patient reported no current or past drug use. Patient reported a history of participation in individual and group therapy, as well as, medication  management. Patient reported no history of psychiatric hospitalization. Patient reported she is the full time caregiver for her husband and reported caregiving responsibilities, patient's husband's health, family dynamics, and the loss of patient's sister are current stressors. Patient identified her spouse and friends as current supports. It is recommended patient participate in individual therapy biweekly. Clinician will review recommendations and treatment plan with patient during follow up appointment. Treatment plan will be developed during follow up appointment.   Collaboration of Care: Other not required at this time   Doree Barthel, LCSW

## 2023-06-20 NOTE — Progress Notes (Unsigned)
                Karen Sharpe, LCSW 

## 2023-06-22 DIAGNOSIS — E119 Type 2 diabetes mellitus without complications: Secondary | ICD-10-CM | POA: Diagnosis not present

## 2023-06-22 DIAGNOSIS — M19049 Primary osteoarthritis, unspecified hand: Secondary | ICD-10-CM | POA: Diagnosis not present

## 2023-06-22 DIAGNOSIS — E7800 Pure hypercholesterolemia, unspecified: Secondary | ICD-10-CM | POA: Diagnosis not present

## 2023-06-22 DIAGNOSIS — R413 Other amnesia: Secondary | ICD-10-CM | POA: Diagnosis not present

## 2023-06-22 DIAGNOSIS — Z794 Long term (current) use of insulin: Secondary | ICD-10-CM | POA: Diagnosis not present

## 2023-06-22 DIAGNOSIS — E1169 Type 2 diabetes mellitus with other specified complication: Secondary | ICD-10-CM | POA: Diagnosis not present

## 2023-06-22 DIAGNOSIS — I10 Essential (primary) hypertension: Secondary | ICD-10-CM | POA: Diagnosis not present

## 2023-06-22 DIAGNOSIS — M2560 Stiffness of unspecified joint, not elsewhere classified: Secondary | ICD-10-CM | POA: Diagnosis not present

## 2023-06-28 ENCOUNTER — Ambulatory Visit: Payer: 59 | Admitting: Clinical

## 2023-07-19 DIAGNOSIS — D2261 Melanocytic nevi of right upper limb, including shoulder: Secondary | ICD-10-CM | POA: Diagnosis not present

## 2023-07-19 DIAGNOSIS — D225 Melanocytic nevi of trunk: Secondary | ICD-10-CM | POA: Diagnosis not present

## 2023-07-19 DIAGNOSIS — B353 Tinea pedis: Secondary | ICD-10-CM | POA: Diagnosis not present

## 2023-07-19 DIAGNOSIS — L918 Other hypertrophic disorders of the skin: Secondary | ICD-10-CM | POA: Diagnosis not present

## 2023-07-19 DIAGNOSIS — Z85828 Personal history of other malignant neoplasm of skin: Secondary | ICD-10-CM | POA: Diagnosis not present

## 2023-07-19 DIAGNOSIS — L738 Other specified follicular disorders: Secondary | ICD-10-CM | POA: Diagnosis not present

## 2023-07-19 DIAGNOSIS — D2262 Melanocytic nevi of left upper limb, including shoulder: Secondary | ICD-10-CM | POA: Diagnosis not present

## 2023-07-19 DIAGNOSIS — L821 Other seborrheic keratosis: Secondary | ICD-10-CM | POA: Diagnosis not present

## 2023-07-19 DIAGNOSIS — L309 Dermatitis, unspecified: Secondary | ICD-10-CM | POA: Diagnosis not present

## 2023-07-19 DIAGNOSIS — L570 Actinic keratosis: Secondary | ICD-10-CM | POA: Diagnosis not present

## 2023-07-19 DIAGNOSIS — Q825 Congenital non-neoplastic nevus: Secondary | ICD-10-CM | POA: Diagnosis not present

## 2023-07-19 DIAGNOSIS — D1801 Hemangioma of skin and subcutaneous tissue: Secondary | ICD-10-CM | POA: Diagnosis not present

## 2023-07-20 ENCOUNTER — Other Ambulatory Visit: Payer: Self-pay | Admitting: Obstetrics and Gynecology

## 2023-07-20 DIAGNOSIS — N3281 Overactive bladder: Secondary | ICD-10-CM

## 2023-07-31 ENCOUNTER — Ambulatory Visit (INDEPENDENT_AMBULATORY_CARE_PROVIDER_SITE_OTHER): Payer: 59 | Admitting: Clinical

## 2023-07-31 DIAGNOSIS — F419 Anxiety disorder, unspecified: Secondary | ICD-10-CM | POA: Diagnosis not present

## 2023-07-31 DIAGNOSIS — F32A Depression, unspecified: Secondary | ICD-10-CM

## 2023-07-31 NOTE — Progress Notes (Signed)
[facility] Counselor/Therapist Progress Note  Patient ID: Destiny Tucker, MRN: [id number]    Date: 07/31/23  Time Spent: 12:36  pm - 1:30 pm : 54 Minutes  Treatment Type: Individual Therapy.  Reported Symptoms: Patient reported anxiety and feeling on edge  Mental Status Exam: Appearance:  Neat and Well Groomed     Behavior: Appropriate  Motor: Normal  Speech/Language:  Clear and Coherent  Affect: Appropriate  Mood: anxious  Thought process: tangential  Thought content:   Tangential  Sensory/Perceptual disturbances:   WNL  Orientation: oriented to person, place, and situation  Attention: Good  Concentration: Good  Memory: WNL  Fund of knowledge:  Good  Insight:   Fair  Judgment:  Good  Impulse Control: Good   Risk Assessment: Danger to Self:  No Patient denied current suicidal ideation  Self-injurious Behavior: No Danger to Others: No Patient denied current homicidal ideation Duty to Warn:no Physical Aggression / Violence:No  Access to Firearms a concern: No  Gang Involvement:No   Subjective:  Patient reported she is a caregiver for her husband and reported her husband experiences medical issues associated with husband's weight, history of prostate cancer, and issues with husband's knees. Patient reported she does not get to participate in activities due to caregiving responsibilities. Patient stated, "I just feel like I can't hardly relax anymore, feeling like I'm on edge". Patient reported feeling she has "disengaged" from her family. Patient reported she remains in contact with her older sister. Patient reported she continues to think about the conflict between patient and patient's nephew. Patient stated, "I haven't teared up, I haven't had any emotional stuff going on" in response to patient's mood since last session. Patient reported a family history of major depressive disorder and schizophrenia. Patient stated, "my goal is mainly to try and learn to  cope with things". Patient reported she experiences dreams that upset her.   Interventions: Clinician conducted session in person at clinician's office at [facility]. Discussed current stressors. Explored patient's support system. Assessed patient's mood since last session. Clinician reviewed diagnosis and treatment recommendations. Provided psycho education related to diagnosis and treatment.   Collaboration of Care: Primary Care Provider AEB Patient requested to complete a consent for patient's PCP, Rinka Pahwani at [facility]  Patient/Guardian was advised Release of Information must be obtained prior to any record release in order to collaborate their care with an outside provider.   Diagnosis:  Depression, unspecified depression type  Anxiety disorder, unspecified type   Plan: Goals to be determined during follow up appointment on 09/04/23.                 Doree Barthel, LCSW

## 2023-09-04 ENCOUNTER — Ambulatory Visit: Payer: 59 | Admitting: Clinical

## 2023-10-09 ENCOUNTER — Other Ambulatory Visit (HOSPITAL_COMMUNITY): Payer: Self-pay | Admitting: Internal Medicine

## 2023-10-09 DIAGNOSIS — Z136 Encounter for screening for cardiovascular disorders: Secondary | ICD-10-CM

## 2023-10-09 DIAGNOSIS — M5442 Lumbago with sciatica, left side: Secondary | ICD-10-CM | POA: Diagnosis not present

## 2023-10-09 DIAGNOSIS — R002 Palpitations: Secondary | ICD-10-CM | POA: Diagnosis not present

## 2023-10-09 DIAGNOSIS — H00014 Hordeolum externum left upper eyelid: Secondary | ICD-10-CM | POA: Diagnosis not present

## 2023-10-16 DIAGNOSIS — H52203 Unspecified astigmatism, bilateral: Secondary | ICD-10-CM | POA: Diagnosis not present

## 2023-10-16 DIAGNOSIS — E119 Type 2 diabetes mellitus without complications: Secondary | ICD-10-CM | POA: Diagnosis not present

## 2023-10-16 DIAGNOSIS — H02834 Dermatochalasis of left upper eyelid: Secondary | ICD-10-CM | POA: Diagnosis not present

## 2023-10-16 DIAGNOSIS — H33011 Retinal detachment with single break, right eye: Secondary | ICD-10-CM | POA: Diagnosis not present

## 2023-10-16 DIAGNOSIS — H02831 Dermatochalasis of right upper eyelid: Secondary | ICD-10-CM | POA: Diagnosis not present

## 2023-10-16 DIAGNOSIS — H02403 Unspecified ptosis of bilateral eyelids: Secondary | ICD-10-CM | POA: Diagnosis not present

## 2023-10-16 DIAGNOSIS — R300 Dysuria: Secondary | ICD-10-CM | POA: Diagnosis not present

## 2023-10-16 DIAGNOSIS — H2513 Age-related nuclear cataract, bilateral: Secondary | ICD-10-CM | POA: Diagnosis not present

## 2023-10-17 DIAGNOSIS — H35033 Hypertensive retinopathy, bilateral: Secondary | ICD-10-CM | POA: Diagnosis not present

## 2023-10-17 DIAGNOSIS — H43813 Vitreous degeneration, bilateral: Secondary | ICD-10-CM | POA: Diagnosis not present

## 2023-10-17 DIAGNOSIS — H33311 Horseshoe tear of retina without detachment, right eye: Secondary | ICD-10-CM | POA: Diagnosis not present

## 2023-10-17 DIAGNOSIS — H4311 Vitreous hemorrhage, right eye: Secondary | ICD-10-CM | POA: Diagnosis not present

## 2023-10-17 DIAGNOSIS — H35373 Puckering of macula, bilateral: Secondary | ICD-10-CM | POA: Diagnosis not present

## 2023-10-22 DIAGNOSIS — H35033 Hypertensive retinopathy, bilateral: Secondary | ICD-10-CM | POA: Diagnosis not present

## 2023-10-22 DIAGNOSIS — H4311 Vitreous hemorrhage, right eye: Secondary | ICD-10-CM | POA: Diagnosis not present

## 2023-10-22 DIAGNOSIS — H35373 Puckering of macula, bilateral: Secondary | ICD-10-CM | POA: Diagnosis not present

## 2023-10-30 ENCOUNTER — Encounter (HOSPITAL_COMMUNITY): Payer: Self-pay

## 2023-10-30 ENCOUNTER — Ambulatory Visit (HOSPITAL_COMMUNITY): Payer: 59

## 2023-11-05 DIAGNOSIS — H31091 Other chorioretinal scars, right eye: Secondary | ICD-10-CM | POA: Diagnosis not present

## 2023-11-05 DIAGNOSIS — H35373 Puckering of macula, bilateral: Secondary | ICD-10-CM | POA: Diagnosis not present

## 2023-11-05 DIAGNOSIS — H35033 Hypertensive retinopathy, bilateral: Secondary | ICD-10-CM | POA: Diagnosis not present

## 2023-11-05 DIAGNOSIS — H4311 Vitreous hemorrhage, right eye: Secondary | ICD-10-CM | POA: Diagnosis not present

## 2023-11-05 DIAGNOSIS — H43813 Vitreous degeneration, bilateral: Secondary | ICD-10-CM | POA: Diagnosis not present

## 2023-12-21 DIAGNOSIS — H35373 Puckering of macula, bilateral: Secondary | ICD-10-CM | POA: Diagnosis not present

## 2023-12-21 DIAGNOSIS — H35033 Hypertensive retinopathy, bilateral: Secondary | ICD-10-CM | POA: Diagnosis not present

## 2023-12-21 DIAGNOSIS — H31091 Other chorioretinal scars, right eye: Secondary | ICD-10-CM | POA: Diagnosis not present

## 2023-12-21 DIAGNOSIS — H43813 Vitreous degeneration, bilateral: Secondary | ICD-10-CM | POA: Diagnosis not present

## 2023-12-21 DIAGNOSIS — H4311 Vitreous hemorrhage, right eye: Secondary | ICD-10-CM | POA: Diagnosis not present

## 2023-12-21 DIAGNOSIS — H33311 Horseshoe tear of retina without detachment, right eye: Secondary | ICD-10-CM | POA: Diagnosis not present

## 2023-12-25 DIAGNOSIS — M8588 Other specified disorders of bone density and structure, other site: Secondary | ICD-10-CM | POA: Diagnosis not present

## 2023-12-25 DIAGNOSIS — I10 Essential (primary) hypertension: Secondary | ICD-10-CM | POA: Diagnosis not present

## 2023-12-25 DIAGNOSIS — Z0000 Encounter for general adult medical examination without abnormal findings: Secondary | ICD-10-CM | POA: Diagnosis not present

## 2023-12-25 DIAGNOSIS — E1169 Type 2 diabetes mellitus with other specified complication: Secondary | ICD-10-CM | POA: Diagnosis not present

## 2023-12-25 DIAGNOSIS — R2689 Other abnormalities of gait and mobility: Secondary | ICD-10-CM | POA: Diagnosis not present

## 2023-12-25 DIAGNOSIS — E7800 Pure hypercholesterolemia, unspecified: Secondary | ICD-10-CM | POA: Diagnosis not present

## 2023-12-25 DIAGNOSIS — Z794 Long term (current) use of insulin: Secondary | ICD-10-CM | POA: Diagnosis not present

## 2023-12-25 DIAGNOSIS — M1990 Unspecified osteoarthritis, unspecified site: Secondary | ICD-10-CM | POA: Diagnosis not present

## 2024-01-01 DIAGNOSIS — L293 Anogenital pruritus, unspecified: Secondary | ICD-10-CM | POA: Diagnosis not present

## 2024-01-01 DIAGNOSIS — M8580 Other specified disorders of bone density and structure, unspecified site: Secondary | ICD-10-CM | POA: Diagnosis not present

## 2024-01-01 DIAGNOSIS — L900 Lichen sclerosus et atrophicus: Secondary | ICD-10-CM | POA: Diagnosis not present

## 2024-02-05 DIAGNOSIS — I10 Essential (primary) hypertension: Secondary | ICD-10-CM | POA: Diagnosis not present

## 2024-02-05 DIAGNOSIS — E7800 Pure hypercholesterolemia, unspecified: Secondary | ICD-10-CM | POA: Diagnosis not present

## 2024-02-05 DIAGNOSIS — M8588 Other specified disorders of bone density and structure, other site: Secondary | ICD-10-CM | POA: Diagnosis not present

## 2024-02-05 DIAGNOSIS — E118 Type 2 diabetes mellitus with unspecified complications: Secondary | ICD-10-CM | POA: Diagnosis not present

## 2024-03-12 ENCOUNTER — Other Ambulatory Visit (INDEPENDENT_AMBULATORY_CARE_PROVIDER_SITE_OTHER): Payer: Self-pay

## 2024-03-12 ENCOUNTER — Telehealth: Payer: Self-pay

## 2024-03-12 ENCOUNTER — Ambulatory Visit: Admitting: Orthopedic Surgery

## 2024-03-12 VITALS — Ht 63.0 in | Wt 223.6 lb

## 2024-03-12 DIAGNOSIS — M25562 Pain in left knee: Secondary | ICD-10-CM

## 2024-03-12 DIAGNOSIS — M79641 Pain in right hand: Secondary | ICD-10-CM

## 2024-03-12 DIAGNOSIS — M25561 Pain in right knee: Secondary | ICD-10-CM | POA: Diagnosis not present

## 2024-03-12 DIAGNOSIS — R200 Anesthesia of skin: Secondary | ICD-10-CM

## 2024-03-12 NOTE — Telephone Encounter (Signed)
 Auth needed for bil knee gel

## 2024-03-14 ENCOUNTER — Encounter: Payer: Self-pay | Admitting: Orthopedic Surgery

## 2024-03-14 DIAGNOSIS — Z1231 Encounter for screening mammogram for malignant neoplasm of breast: Secondary | ICD-10-CM | POA: Diagnosis not present

## 2024-03-14 NOTE — Progress Notes (Signed)
 Office Visit Note   Patient: Destiny Tucker           Date of Birth: 12/18/1955           MRN: [id number] Visit Date: 03/12/2024 Requested by: Jacqulyne Maxim, MD [address] [place],  [place] [address] PCP: Jacqulyne Maxim, MD  Subjective: Chief Complaint  Patient presents with   Right Hand - Pain    HPI: Destiny Tucker is a 68 y.o. female who presents to the office reporting Right hand pain as well as bilateral knee pain right worse than left.  She states her right hand pain has been ongoing for years.  Worse over the past several months.  Describes tingling.  She was told years ago that she has carpal tunnel syndrome.  She is right-hand dominant.  Early morning is the worst pain.  This has been waking her up most mornings.  She tried a brace at night but that was not really helpful.  She does like to exercise 3 times a week at the [facility].  Patient also reports bilateral knee pain right worse than left.  Describes primarily lateral and posterior pain on the right-hand side and left-hand side.  No prior knee surgery.  Does describe some catching.  Does have a history of diabetes with last hemoglobin A1c 6.0..                ROS: All systems reviewed are negative as they relate to the chief complaint within the history of present illness.  Patient denies fevers or chills.  Assessment & Plan: Visit Diagnoses:  1. Pain in right hand   2. Numbness of right hand     Plan: Impression is right hand carpal tunnel syndrome.  Nerve conduction to evaluate both hands.  Regarding the knee she does have bilateral knee arthritis.  Would like to try gel injections for both knees.  Continue with nonweightbearing quad strengthening exercises. This patient is diagnosed with osteoarthritis of the knee(s).    Radiographs show evidence of joint space narrowing, osteophytes, subchondral sclerosis and/or subchondral cysts.  This patient has knee pain which interferes with  functional and activities of daily living.    This patient has experienced inadequate response, adverse effects and/or intolerance with conservative treatments such as acetaminophen , NSAIDS, topical creams, physical therapy or regular exercise, knee bracing and/or weight loss.   This patient has experienced inadequate response or has a contraindication to intra articular steroid injections for at least 3 months.   This patient is not scheduled to have a total knee replacement within 6 months of starting treatment with viscosupplementation.   Follow-Up Instructions: No follow-ups on file.   Orders:  Orders Placed This Encounter  Procedures   XR Hand Complete Right   XR KNEE 3 VIEW RIGHT   XR Knee 1-2 Views Left   Ambulatory referral to Physical Medicine Rehab   No orders of the defined types were placed in this encounter.     Procedures: No procedures performed   Clinical Data: No additional findings.  Objective: Vital Signs: Ht 5\' 3"  (1.6 m)   Wt 223 lb 9.6 oz (101.4 kg)   BMI 39.61 kg/m   Physical Exam:  Constitutional: Patient appears well-developed HEENT:  Head: Normocephalic Eyes:EOM are normal Neck: Normal range of motion Cardiovascular: Normal rate Pulmonary/chest: Effort normal Neurologic: Patient is alert Skin: Skin is warm Psychiatric: Patient has normal mood and affect  Ortho Exam: Ortho exam demonstrates  bilateral knee range of motion of 10-1 05.  Pedal pulses palpable.  Ankle dorsiflexion intact.  No groin pain with internal/external rotation of the leg.  Extensor mechanism intact.  Collateral and cruciate ligaments are stable.  Right hand is examined.  She does have positive carpal tunnel compression testing and negative tenderness in the cubital tunnel on the right.  Elbow and wrist range of motion is full.  Intact EPL FPL interosseous function.  Patient has bilateral 5 out of 5 grip EPL FPL interosseous wrist flexion wrist extension bicep triceps and  deltoid strength.  Bilateral palpable radial pulses and no paresthesias C5-T1 in either arm.  Neck range of motion flexion chin to chest with extension approximately 50 degrees with approximately 50 degrees of rotation bilaterally.  No masses lymphadenopathy or skin changes around the neck or shoulder girdle region bilaterally   Specialty Comments:  No specialty comments available.  Imaging: No results found.   PMFS History: Patient Active Problem List   Diagnosis Date Noted   Closed fracture of left distal radius 08/01/2022   Arthritis 01/21/2020   Osteopenia 01/21/2020   Abnormal cervical Papanicolaou smear 12/22/2019   Chronic interstitial cystitis 12/22/2019   Genital herpes simplex 12/22/2019   Actinic keratosis 09/04/2019   Diffuse photodamage of skin 09/04/2019   History of nonmelanoma skin cancer 09/04/2019   Routine adult health maintenance 05/21/2017   Obesity 12/22/2016   Degenerative arthritis of knee 10/25/2016   Right knee pain 05/09/2016   Chronic pain of both knees 03/16/2016   Trochanteric bursitis of right hip 01/28/2016   Acute sinusitis 09/28/2015   Carpal tunnel syndrome 09/28/2015   Pre-employment health screening examination 05/26/2015   Muscle spasms of neck 03/09/2015   Bilateral lower extremity edema 03/09/2015   Degenerative arthritis of left knee 01/26/2015   Pronation deformity of both feet 01/26/2015   Bladder problem 08/02/2014   Type 2 diabetes mellitus (HCC) 08/30/2010   Hyperlipidemia 05/18/2010   Depression 05/18/2010   Hypertensive disorder 05/18/2010   Allergic rhinitis 05/18/2010   TOBACCO USE, QUIT 05/18/2010   HYSTEROSCOPY, HX OF 05/18/2010   Anxiety 10/26/2008   Hemorrhoids, internal 10/26/2008   Disorder of bone and cartilage 10/26/2008   Past Medical History:  Diagnosis Date   Allergic rhinitis    Carpal tunnel syndrome    GAD (generalized anxiety disorder)    Genital herpes    Hemorrhoids    History of low-risk melanoma     per pt excision rectal/ perineal area (moh's per pt) yrs ago   History of SCC (squamous cell carcinoma) of skin 2010   Hyperlipidemia    Hypertension    Internal hemorrhoids without mention of complication    Left wrist fracture 07/2022   MDD (major depressive disorder)    OA (osteoarthritis)    Osteoporosis    Type 2 diabetes mellitus [facility] with insulin  [facility])    endocrinologist-- jamie huffman np;   checks blood sugar w/ Jerrilyn Moras,  (07-28-2022 pt stated fasting blood sugar 130--140)    Family History  Problem Relation Age of Onset   Dementia Mother    Alzheimer's disease Mother    Nephrolithiasis Father    Hypertension Father    Hyperlipidemia Father    Stroke Father    Aortic aneurysm Father    Diabetes Maternal Uncle     Past Surgical History:  Procedure Laterality Date   LAPAROSCOPIC SALPINGO OOPHERECTOMY Left 1983   LAPAROSCOPIC SALPINGOOPHERECTOMY Right 09/21/2004   @[facility]   by dr Yehuda Helms;  EXTENSIVE LYSIS ADHESIONS AND D&C HYSTEROSCOPY   MOHS SURGERY     per pt yrs ago rectal / perineal area   ORIF WRIST FRACTURE Left 08/01/2022   Procedure: OPEN REDUCTION INTERNAL FIXATION (ORIF) WRIST FRACTURE;  Surgeon: Saundra Curl, MD;  Location: [facility];  Service: Orthopedics;  Laterality: Left;   Social History   Occupational History   Occupation: Retired    Associate Professor: [employer]  Tobacco Use   Smoking status: Former    Current packs/day: 0.00    Types: Cigarettes    Start date: 1984    Quit date: 2009    Years since quitting: 16.3   Smokeless tobacco: Never  Vaping Use   Vaping status: Never Used  Substance and Sexual Activity   Alcohol use: Not Currently    Comment: rare   Drug use: Never   Sexual activity: Not Currently    Birth control/protection: Post-menopausal

## 2024-03-20 ENCOUNTER — Telehealth: Payer: Self-pay | Admitting: Orthopedic Surgery

## 2024-03-20 NOTE — Telephone Encounter (Signed)
VOB submitted for Durolane, bilateral knee  

## 2024-03-20 NOTE — Telephone Encounter (Signed)
 Patient called would like the status of the gel injections. Would like a call.

## 2024-03-20 NOTE — Telephone Encounter (Signed)
Talked with patient and appt.has been scheduled for gel injection.  

## 2024-03-25 ENCOUNTER — Other Ambulatory Visit: Payer: Self-pay

## 2024-03-25 DIAGNOSIS — H43813 Vitreous degeneration, bilateral: Secondary | ICD-10-CM | POA: Diagnosis not present

## 2024-03-25 DIAGNOSIS — H4311 Vitreous hemorrhage, right eye: Secondary | ICD-10-CM | POA: Diagnosis not present

## 2024-03-25 DIAGNOSIS — H35033 Hypertensive retinopathy, bilateral: Secondary | ICD-10-CM | POA: Diagnosis not present

## 2024-03-25 DIAGNOSIS — H33311 Horseshoe tear of retina without detachment, right eye: Secondary | ICD-10-CM | POA: Diagnosis not present

## 2024-03-25 DIAGNOSIS — H31091 Other chorioretinal scars, right eye: Secondary | ICD-10-CM | POA: Diagnosis not present

## 2024-03-25 DIAGNOSIS — M25562 Pain in left knee: Secondary | ICD-10-CM

## 2024-03-25 DIAGNOSIS — H35373 Puckering of macula, bilateral: Secondary | ICD-10-CM | POA: Diagnosis not present

## 2024-03-27 ENCOUNTER — Ambulatory Visit (INDEPENDENT_AMBULATORY_CARE_PROVIDER_SITE_OTHER): Admitting: Orthopedic Surgery

## 2024-03-27 ENCOUNTER — Telehealth: Payer: Self-pay

## 2024-03-27 DIAGNOSIS — M170 Bilateral primary osteoarthritis of knee: Secondary | ICD-10-CM

## 2024-03-27 NOTE — Telephone Encounter (Signed)
 Auth needed for bil knee gel

## 2024-03-28 ENCOUNTER — Encounter: Payer: Self-pay | Admitting: Orthopedic Surgery

## 2024-03-28 ENCOUNTER — Encounter: Admitting: Physical Medicine and Rehabilitation

## 2024-03-28 DIAGNOSIS — M170 Bilateral primary osteoarthritis of knee: Secondary | ICD-10-CM | POA: Diagnosis not present

## 2024-03-28 MED ORDER — SODIUM HYALURONATE 60 MG/3ML IX PRSY
60.0000 mg | PREFILLED_SYRINGE | INTRA_ARTICULAR | Status: AC | PRN
  Administered 2024-03-28: 60 mg via INTRA_ARTICULAR

## 2024-03-28 NOTE — Progress Notes (Signed)
   Procedure Note  Patient: Destiny Tucker             Date of Birth: 1956-05-18           MRN: [id number]             Visit Date: 03/27/2024  Procedures: Visit Diagnoses:  1. Primary osteoarthritis of both knees     Large Joint Inj: bilateral knee on 03/28/2024 10:38 PM Indications: diagnostic evaluation, joint swelling and pain Details: 18 G 1.5 in needle, superolateral approach  Arthrogram: No  Medications (Right): 60 mg Sodium Hyaluronate 60 MG/3ML Medications (Left): 60 mg Sodium Hyaluronate 60 MG/3ML Outcome: tolerated well, no immediate complications Procedure, treatment alternatives, risks and benefits explained, specific risks discussed. Consent was given by the patient. Immediately prior to procedure a time out was called to verify the correct patient, procedure, equipment, support staff and site/side marked as required. Patient was prepped and draped in the usual sterile fashion.

## 2024-03-31 ENCOUNTER — Telehealth: Payer: Self-pay | Admitting: Orthopedic Surgery

## 2024-03-31 NOTE — Telephone Encounter (Signed)
 Patient called and said she had the gel injections and she is hurting so bad. CB#[phone]

## 2024-03-31 NOTE — Telephone Encounter (Signed)
 Come in today for Toradol injections thanks

## 2024-03-31 NOTE — Telephone Encounter (Signed)
 I called pt and she said she is starting to feel better and would give it a couple more days before coming in for that inj.

## 2024-04-07 ENCOUNTER — Ambulatory Visit: Admitting: Surgical

## 2024-04-07 ENCOUNTER — Encounter: Payer: Self-pay | Admitting: Surgical

## 2024-04-07 DIAGNOSIS — R200 Anesthesia of skin: Secondary | ICD-10-CM

## 2024-04-07 DIAGNOSIS — M170 Bilateral primary osteoarthritis of knee: Secondary | ICD-10-CM | POA: Diagnosis not present

## 2024-04-07 MED ORDER — BUPIVACAINE HCL 0.25 % IJ SOLN
4.0000 mL | INTRAMUSCULAR | Status: AC | PRN
  Administered 2024-04-07: 4 mL via INTRA_ARTICULAR

## 2024-04-07 MED ORDER — LIDOCAINE HCL 1 % IJ SOLN
5.0000 mL | INTRAMUSCULAR | Status: AC | PRN
  Administered 2024-04-07: 5 mL

## 2024-04-07 NOTE — Progress Notes (Signed)
   Procedure Note  Patient: Destiny Tucker             Date of Birth: 07/11/1956           MRN: [id number]             Visit Date: 04/07/2024  Procedures: Visit Diagnoses: No diagnosis found.  Large Joint Inj: bilateral knee on 04/07/2024 2:19 PM Indications: diagnostic evaluation, joint swelling and pain Details: 18 G 1.5 in needle, superolateral approach  Arthrogram: No  Medications (Right): 5 mL lidocaine  1 %; 4 mL bupivacaine  0.25 % Medications (Left): 5 mL lidocaine  1 %; 4 mL bupivacaine  0.25 % Outcome: tolerated well, no immediate complications  Patient is here today for bilateral knee Toradol injections.  Had increased pain after bilateral knee gel injections done a little over a week ago.  She noticed pain the following day.  No fevers or chills.  Toradol injection (1 cc Toradol mixed with 4 cc of bupivacaine ) administered in both knees and patient tolerated procedure well without complication.  Also reordered nerve conduction study of right upper extremity to evaluate for right-sided carpal tunnel syndrome Procedure, treatment alternatives, risks and benefits explained, specific risks discussed. Consent was given by the patient. Immediately prior to procedure a time out was called to verify the correct patient, procedure, equipment, support staff and site/side marked as required. Patient was prepped and draped in the usual sterile fashion.

## 2024-04-07 NOTE — Addendum Note (Signed)
 Addended by: Acey Ace on: 04/07/2024 03:22 PM   Modules accepted: Orders

## 2024-04-15 NOTE — Telephone Encounter (Signed)
 Next available gel injection would need to be after 09/27/2024. Will submit in October, 2025

## 2024-04-24 DIAGNOSIS — I10 Essential (primary) hypertension: Secondary | ICD-10-CM | POA: Diagnosis not present

## 2024-04-24 DIAGNOSIS — E1169 Type 2 diabetes mellitus with other specified complication: Secondary | ICD-10-CM | POA: Diagnosis not present

## 2024-04-24 DIAGNOSIS — H02403 Unspecified ptosis of bilateral eyelids: Secondary | ICD-10-CM | POA: Diagnosis not present

## 2024-04-24 DIAGNOSIS — L0390 Cellulitis, unspecified: Secondary | ICD-10-CM | POA: Diagnosis not present

## 2024-04-28 ENCOUNTER — Encounter: Payer: Self-pay | Admitting: Physical Medicine and Rehabilitation

## 2024-04-28 ENCOUNTER — Ambulatory Visit (INDEPENDENT_AMBULATORY_CARE_PROVIDER_SITE_OTHER): Admitting: Physical Medicine and Rehabilitation

## 2024-04-28 DIAGNOSIS — M79641 Pain in right hand: Secondary | ICD-10-CM | POA: Diagnosis not present

## 2024-04-28 DIAGNOSIS — R200 Anesthesia of skin: Secondary | ICD-10-CM

## 2024-04-28 DIAGNOSIS — R202 Paresthesia of skin: Secondary | ICD-10-CM | POA: Diagnosis not present

## 2024-04-28 NOTE — Procedures (Signed)
 EMG & NCV Findings: Evaluation of the right median motor nerve showed prolonged distal onset latency (6.6 ms) and decreased conduction velocity (Elbow-Wrist, 43 m/s).  The right median (across palm) sensory nerve showed prolonged distal peak latency (Wrist, 5.6 ms) and prolonged distal peak latency (Palm, 5.4 ms).  All remaining nerves (as indicated in the following tables) were within normal limits.    All examined muscles (as indicated in the following table) showed no evidence of electrical instability.    Impression: The above electrodiagnostic study is ABNORMAL and reveals evidence of a moderate to severe right median nerve entrapment at the wrist (carpal tunnel syndrome) affecting sensory and motor components.   There is no significant electrodiagnostic evidence of any other focal nerve entrapment, brachial plexopathy, cervical radiculopathy or generalized peripheral neuropathy.   Recommendations: 1.  Follow-up with referring physician. 2.  Continue current management of symptoms. 3.  Continue use of resting splint at night-time and as needed during the day. 4.  Suggest surgical evaluation.  ___________________________ Collin Deal FAAPMR Board Certified, American Board of Physical Medicine and Rehabilitation    Nerve Conduction Studies Anti Sensory Summary Table   Stim Site NR Peak (ms) Norm Peak (ms) P-T Amp (V) Norm P-T Amp Site1 Site2 Delta-P (ms) Dist (cm) Vel (m/s) Norm Vel (m/s)  Right Median Acr Palm Anti Sensory (2nd Digit)  31.3C  Wrist    *5.6 <3.6 10.5 >10 Wrist Palm 0.2 0.0    Palm    *5.4 <2.0 10.1         Right Radial Anti Sensory (Base 1st Digit)  31.3C  Wrist    1.9 <3.1 33.2  Wrist Base 1st Digit 1.9 0.0    Right Ulnar Anti Sensory (5th Digit)  31.7C  Wrist    3.0 <3.7 25.0 >15.0 Wrist 5th Digit 3.0 14.0 47 >38   Motor Summary Table   Stim Site NR Onset (ms) Norm Onset (ms) O-P Amp (mV) Norm O-P Amp Site1 Site2 Delta-0 (ms) Dist (cm) Vel (m/s) Norm Vel  (m/s)  Right Median Motor (Abd Poll Brev)  31.7C  Wrist    *6.6 <4.2 5.3 >5 Elbow Wrist 4.3 18.5 *43 >50  Elbow    10.9  2.2         Right Ulnar Motor (Abd Dig Min)  31.7C  Wrist    2.7 <4.2 8.5 >3 B Elbow Wrist 2.8 18.0 64 >53  B Elbow    5.5  9.8  A Elbow B Elbow 1.1 10.0 91 >53  A Elbow    6.6  9.5          EMG   Side Muscle Nerve Root Ins Act Fibs Psw Amp Dur Poly Recrt Int Deatra Face Comment  Right Abd Poll Brev Median C8-T1 Nml Nml Nml Nml Nml 0 Nml Nml   Right 1stDorInt Ulnar C8-T1 Nml Nml Nml Nml Nml 0 Nml Nml   Right PronatorTeres Median C6-7 Nml Nml Nml Nml Nml 0 Nml Nml   Right Biceps Musculocut C5-6 Nml Nml Nml Nml Nml 0 Nml Nml     Nerve Conduction Studies Anti Sensory Left/Right Comparison   Stim Site L Lat (ms) R Lat (ms) L-R Lat (ms) L Amp (V) R Amp (V) L-R Amp (%) Site1 Site2 L Vel (m/s) R Vel (m/s) L-R Vel (m/s)  Median Acr Palm Anti Sensory (2nd Digit)  31.3C  Wrist  *5.6   10.5  Wrist Palm     Palm  *5.4   10.1  Radial Anti Sensory (Base 1st Digit)  31.3C  Wrist  1.9   33.2  Wrist Base 1st Digit     Ulnar Anti Sensory (5th Digit)  31.7C  Wrist  3.0   25.0  Wrist 5th Digit  47    Motor Left/Right Comparison   Stim Site L Lat (ms) R Lat (ms) L-R Lat (ms) L Amp (mV) R Amp (mV) L-R Amp (%) Site1 Site2 L Vel (m/s) R Vel (m/s) L-R Vel (m/s)  Median Motor (Abd Poll Brev)  31.7C  Wrist  *6.6   5.3  Elbow Wrist  *43   Elbow  10.9   2.2        Ulnar Motor (Abd Dig Min)  31.7C  Wrist  2.7   8.5  B Elbow Wrist  64   B Elbow  5.5   9.8  A Elbow B Elbow  91   A Elbow  6.6   9.5           Waveforms:

## 2024-04-28 NOTE — Progress Notes (Signed)
 Patient is here to have NCS/EMG on her right hand. She is having numbness and tingling. She is right handed. Wears brace at times. Worse when using it. Feel like electrical shocks. Wakes her up at night and unable to sleep. Uses her hand a lot to do chores around the house. Takes Naproxen  as needed for pain but mainly for knee. Feels like its getting worse.   Pain Scale   Average Pain 2

## 2024-04-28 NOTE — Progress Notes (Signed)
 Destiny Tucker - 68 y.o. female MRN [id number]  Date of birth: 16-Nov-1956  Office Visit Note: Visit Date: 04/28/2024 PCP: Jacqulyne Maxim, MD Referred by: Casilda Clayman, PA-C  Subjective: Chief Complaint  Patient presents with   Right Hand - Numbness   HPI: Destiny Tucker is a 68 y.o. female who comes in today at the request of Dr. Marykay Snipes and Prentis Brock, PA-C for evaluation and management of chronic, worsening and severe pain, numbness and tingling in the Right upper extremities.  Patient is Right hand dominant.  She reports many years of right hand pain with some getting jars and doing work around the house.  She reports any type of activity causes increased pain numbness and tingling.  She is getting this tingling more in the radial side of the hand up to the elbow.  No real frank radicular symptoms down the arm.  She does get nocturnal complaints and really the early morning is the worst.  She has been using bracing but has not really been helpful but she has only been using those kind of as needed.  She is continue to try to exercise and take medications without much relief.  She has been told in the past through sports medicine at [facility] that she had carpal tunnel syndrome.  Her case is complicated by type 2 diabetes.  She has no real tingling in the feet although she gets a burning sensation at times and has had a history lately of cellulitis.   I spent more than 30 minutes speaking face-to-face with the patient with 50% of the time in counseling and discussing coordination of care.       Review of Systems  Musculoskeletal:  Positive for joint pain and neck pain.  Neurological:  Positive for tingling and weakness.  All other systems reviewed and are negative.  Otherwise per HPI.  Assessment & Plan: Visit Diagnoses:    ICD-10-CM   1. Paresthesia of skin  R20.2 NCV with EMG (electromyography)    2. Pain in right hand  M79.641 NCV with EMG  (electromyography)    3. 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  Cannot rule out peripheral polyneuropathy or radiculopathy.  Electrodiagnostic study performed today.  The above electrodiagnostic study is ABNORMAL and reveals evidence of a moderate to severe right median nerve entrapment at the wrist (carpal tunnel syndrome) affecting sensory and motor components.   There is no significant electrodiagnostic evidence of any other focal nerve entrapment, brachial plexopathy, cervical radiculopathy or generalized peripheral neuropathy.   Recommendations: 1.  Follow-up with referring physician. 2.  Continue current management of symptoms. 3.  Continue use of resting splint at night-time and as needed during the day. 4.  Suggest surgical evaluation.  Meds & Orders: No orders of the defined types were placed in this encounter.   Orders Placed This Encounter  Procedures   NCV with EMG (electromyography)    Follow-up: Return for G. Lynita Saris, MD.   Procedures: No procedures performed  EMG & NCV Findings: Evaluation of the right median motor nerve showed prolonged distal onset latency (6.6 ms) and decreased conduction velocity (Elbow-Wrist, 43 m/s).  The right median (across palm) sensory nerve showed prolonged distal peak latency (Wrist, 5.6 ms) and prolonged distal peak latency (Palm, 5.4 ms).  All remaining nerves (  as indicated in the following tables) were within normal limits.    All examined muscles (as indicated in the following table) showed no evidence of electrical instability.    Impression: The above electrodiagnostic study is ABNORMAL and reveals evidence of a moderate to severe right median nerve entrapment at the wrist (carpal tunnel syndrome) affecting sensory and motor components.   There is no significant electrodiagnostic evidence  of any other focal nerve entrapment, brachial plexopathy, cervical radiculopathy or generalized peripheral neuropathy.   Recommendations: 1.  Follow-up with referring physician. 2.  Continue current management of symptoms. 3.  Continue use of resting splint at night-time and as needed during the day. 4.  Suggest surgical evaluation.  ___________________________ Collin Deal FAAPMR Board Certified, American Board of Physical Medicine and Rehabilitation    Nerve Conduction Studies Anti Sensory Summary Table   Stim Site NR Peak (ms) Norm Peak (ms) P-T Amp (V) Norm P-T Amp Site1 Site2 Delta-P (ms) Dist (cm) Vel (m/s) Norm Vel (m/s)  Right Median Acr Palm Anti Sensory (2nd Digit)  31.3C  Wrist    *5.6 <3.6 10.5 >10 Wrist Palm 0.2 0.0    Palm    *5.4 <2.0 10.1         Right Radial Anti Sensory (Base 1st Digit)  31.3C  Wrist    1.9 <3.1 33.2  Wrist Base 1st Digit 1.9 0.0    Right Ulnar Anti Sensory (5th Digit)  31.7C  Wrist    3.0 <3.7 25.0 >15.0 Wrist 5th Digit 3.0 14.0 47 >38   Motor Summary Table   Stim Site NR Onset (ms) Norm Onset (ms) O-P Amp (mV) Norm O-P Amp Site1 Site2 Delta-0 (ms) Dist (cm) Vel (m/s) Norm Vel (m/s)  Right Median Motor (Abd Poll Brev)  31.7C  Wrist    *6.6 <4.2 5.3 >5 Elbow Wrist 4.3 18.5 *43 >50  Elbow    10.9  2.2         Right Ulnar Motor (Abd Dig Min)  31.7C  Wrist    2.7 <4.2 8.5 >3 B Elbow Wrist 2.8 18.0 64 >53  B Elbow    5.5  9.8  A Elbow B Elbow 1.1 10.0 91 >53  A Elbow    6.6  9.5          EMG   Side Muscle Nerve Root Ins Act Fibs Psw Amp Dur Poly Recrt Int Deatra Face Comment  Right Abd Poll Brev Median C8-T1 Nml Nml Nml Nml Nml 0 Nml Nml   Right 1stDorInt Ulnar C8-T1 Nml Nml Nml Nml Nml 0 Nml Nml   Right PronatorTeres Median C6-7 Nml Nml Nml Nml Nml 0 Nml Nml   Right Biceps Musculocut C5-6 Nml Nml Nml Nml Nml 0 Nml Nml     Nerve Conduction Studies Anti Sensory Left/Right Comparison   Stim Site L Lat (ms) R Lat (ms) L-R Lat (ms) L Amp (V) R  Amp (V) L-R Amp (%) Site1 Site2 L Vel (m/s) R Vel (m/s) L-R Vel (m/s)  Median Acr Palm Anti Sensory (2nd Digit)  31.3C  Wrist  *5.6   10.5  Wrist Palm     Palm  *5.4   10.1        Radial Anti Sensory (Base 1st Digit)  31.3C  Wrist  1.9   33.2  Wrist Base 1st Digit     Ulnar Anti Sensory (5th Digit)  31.7C  Wrist  3.0   25.0  Wrist 5th Digit  47  Motor Left/Right Comparison   Stim Site L Lat (ms) R Lat (ms) L-R Lat (ms) L Amp (mV) R Amp (mV) L-R Amp (%) Site1 Site2 L Vel (m/s) R Vel (m/s) L-R Vel (m/s)  Median Motor (Abd Poll Brev)  31.7C  Wrist  *6.6   5.3  Elbow Wrist  *43   Elbow  10.9   2.2        Ulnar Motor (Abd Dig Min)  31.7C  Wrist  2.7   8.5  B Elbow Wrist  64   B Elbow  5.5   9.8  A Elbow B Elbow  91   A Elbow  6.6   9.5           Waveforms:            Clinical History: No specialty comments available.   She reports that she quit smoking about 16 years ago. Her smoking use included cigarettes. She started smoking about 41 years ago. She has never used smokeless tobacco. No results for input(s): "HGBA1C", "LABURIC" in the last 8760 hours.  Objective:  VS:  HT:    WT:   BMI:     BP:   HR: bpm  TEMP: ( )  RESP:  Physical Exam Vitals and nursing note reviewed.  Constitutional:      General: She is not in acute distress.    Appearance: Normal appearance. She is well-developed. She is not ill-appearing.  HENT:     Head: Normocephalic and atraumatic.  Eyes:     Conjunctiva/sclera: Conjunctivae normal.     Pupils: Pupils are equal, round, and reactive to light.  Cardiovascular:     Rate and Rhythm: Normal rate.     Pulses: Normal pulses.  Pulmonary:     Effort: Pulmonary effort is normal.  Musculoskeletal:        General: Tenderness present. No swelling or deformity.     Right lower leg: No edema.     Left lower leg: No edema.     Comments: Inspection reveals no atrophy of the bilateral APB or FDI or hand intrinsics. There is no swelling, color  changes, allodynia or dystrophic changes. There is 5 out of 5 strength in the bilateral wrist extension, finger abduction and long finger flexion. There is intact sensation to light touch in all dermatomal and peripheral nerve distributions. There is a positive Phalen's test on th right. There is a negative Hoffmann's test bilaterally.  Skin:    General: Skin is warm and dry.     Findings: No erythema or rash.  Neurological:     General: No focal deficit present.     Mental Status: She is alert and oriented to person, place, and time.     Cranial Nerves: No cranial nerve deficit.     Sensory: No sensory deficit.     Motor: No weakness or abnormal muscle tone.     Coordination: Coordination normal.     Gait: Gait normal.  Psychiatric:        Mood and Affect: Mood normal.        Behavior: Behavior normal.     Ortho Exam  Imaging: No results found.  Past Medical/Family/Surgical/Social History: Medications & Allergies reviewed per EMR, new medications updated. Patient Active Problem List   Diagnosis Date Noted   Closed fracture of left distal radius 08/01/2022   Arthritis 01/21/2020   Osteopenia 01/21/2020   Abnormal cervical Papanicolaou smear 12/22/2019   Chronic interstitial cystitis 12/22/2019   Genital  herpes simplex 12/22/2019   Actinic keratosis 09/04/2019   Diffuse photodamage of skin 09/04/2019   History of nonmelanoma skin cancer 09/04/2019   Routine adult health maintenance 05/21/2017   Obesity 12/22/2016   Degenerative arthritis of knee 10/25/2016   Right knee pain 05/09/2016   Chronic pain of both knees 03/16/2016   Trochanteric bursitis of right hip 01/28/2016   Acute sinusitis 09/28/2015   Carpal tunnel syndrome 09/28/2015   Pre-employment health screening examination 05/26/2015   Muscle spasms of neck 03/09/2015   Bilateral lower extremity edema 03/09/2015   Degenerative arthritis of left knee 01/26/2015   Pronation deformity of both feet 01/26/2015    Bladder problem 08/02/2014   Type 2 diabetes mellitus (HCC) 08/30/2010   Hyperlipidemia 05/18/2010   Depression 05/18/2010   Hypertensive disorder 05/18/2010   Allergic rhinitis 05/18/2010   TOBACCO USE, QUIT 05/18/2010   HYSTEROSCOPY, HX OF 05/18/2010   Anxiety 10/26/2008   Hemorrhoids, internal 10/26/2008   Disorder of bone and cartilage 10/26/2008   Past Medical History:  Diagnosis Date   Allergic rhinitis    Carpal tunnel syndrome    GAD (generalized anxiety disorder)    Genital herpes    Hemorrhoids    History of low-risk melanoma    per pt excision rectal/ perineal area (moh's per pt) yrs ago   History of SCC (squamous cell carcinoma) of skin 2010   Hyperlipidemia    Hypertension    Internal hemorrhoids without mention of complication    Left wrist fracture 07/2022   MDD (major depressive disorder)    OA (osteoarthritis)    Osteoporosis    Type 2 diabetes mellitus treated with insulin  [facility])    endocrinologist-- jamie huffman np;   checks blood sugar w/ Jerrilyn Moras,  (07-28-2022 pt stated fasting blood sugar 130--140)   Family History  Problem Relation Age of Onset   Dementia Mother    Alzheimer's disease Mother    Nephrolithiasis Father    Hypertension Father    Hyperlipidemia Father    Stroke Father    Aortic aneurysm Father    Diabetes Maternal Uncle    Past Surgical History:  Procedure Laterality Date   LAPAROSCOPIC SALPINGO OOPHERECTOMY Left 1983   LAPAROSCOPIC SALPINGOOPHERECTOMY Right 09/21/2004   @[facility]   by dr Yehuda Helms;   EXTENSIVE LYSIS ADHESIONS AND D&C HYSTEROSCOPY   MOHS SURGERY     per pt yrs ago rectal / perineal area   ORIF WRIST FRACTURE Left 08/01/2022   Procedure: OPEN REDUCTION INTERNAL FIXATION (ORIF) WRIST FRACTURE;  Surgeon: Saundra Curl, MD;  Location: [facility] [place];  Service: Orthopedics;  Laterality: Left;   Social History   Occupational History   Occupation: Retired    Associate Professor: [employer]  Tobacco Use    Smoking status: Former    Current packs/day: 0.00    Types: Cigarettes    Start date: 1984    Quit date: 2009    Years since quitting: 16.4   Smokeless tobacco: Never  Vaping Use   Vaping status: Never Used  Substance and Sexual Activity   Alcohol use: Not Currently    Comment: rare   Drug use: Never   Sexual activity: Not Currently    Birth control/protection: Post-menopausal

## 2024-06-17 ENCOUNTER — Telehealth: Payer: Self-pay | Admitting: Orthopedic Surgery

## 2024-06-17 DIAGNOSIS — M2550 Pain in unspecified joint: Secondary | ICD-10-CM

## 2024-06-17 NOTE — Telephone Encounter (Signed)
Ok for that thx

## 2024-06-17 NOTE — Telephone Encounter (Signed)
 Patient called. She would like a referral sent to Waddell Craze at [facility].

## 2024-06-17 NOTE — Telephone Encounter (Signed)
 Referral placed.

## 2024-06-24 ENCOUNTER — Telehealth: Payer: Self-pay

## 2024-06-24 DIAGNOSIS — L03116 Cellulitis of left lower limb: Secondary | ICD-10-CM | POA: Diagnosis not present

## 2024-06-24 DIAGNOSIS — E1169 Type 2 diabetes mellitus with other specified complication: Secondary | ICD-10-CM | POA: Diagnosis not present

## 2024-06-24 DIAGNOSIS — I10 Essential (primary) hypertension: Secondary | ICD-10-CM | POA: Diagnosis not present

## 2024-06-24 NOTE — Telephone Encounter (Signed)
 Patient contacted the office and states she has an appointment with Dr. Luba on 07/15/2024. Patient states she has an appointment with her Orthopedic coming up and she usually gets injections for relief. Patient inquired if she should hold off on injections before her appointment with Dr. Luba to ensure it would not hinder the appointment. Advised the patient that the injections is something she should discuss with her orthopedic doctors. Patient inquired if Dr. Luba does injections here. Advised the patient that is something she can discuss at her appointment with Dr. Luba. Patient verbalized understanding.

## 2024-06-25 ENCOUNTER — Telehealth: Payer: Self-pay

## 2024-06-25 NOTE — Telephone Encounter (Signed)
 Patient called wanting to speak to [facility] nurse. Stated that she has appointment with Herlene next week and she has an appointment with Rheumatology on the 26th. She wants to know if she needs to come in and see Herlene before she sees the Rheumatologist  because she wants them to him to get the full effect of her pain.  Please call and advise at [phone]

## 2024-06-25 NOTE — Telephone Encounter (Signed)
 I think from the standpoint of her severe bilateral knee arthritis and her carpal tunnel syndrome which is likely the topic of the next appointment, I do not think her seen rheumatology before me would significantly alter what we talked about at our appointment.  I think she can keep her appointment for the 15th but if she rather reschedule, she is welcome to.

## 2024-07-04 ENCOUNTER — Ambulatory Visit (INDEPENDENT_AMBULATORY_CARE_PROVIDER_SITE_OTHER): Admitting: Surgical

## 2024-07-04 DIAGNOSIS — M79641 Pain in right hand: Secondary | ICD-10-CM | POA: Diagnosis not present

## 2024-07-04 DIAGNOSIS — M170 Bilateral primary osteoarthritis of knee: Secondary | ICD-10-CM | POA: Diagnosis not present

## 2024-07-04 DIAGNOSIS — R200 Anesthesia of skin: Secondary | ICD-10-CM | POA: Diagnosis not present

## 2024-07-05 ENCOUNTER — Encounter: Payer: Self-pay | Admitting: Surgical

## 2024-07-05 NOTE — Progress Notes (Signed)
 Office Visit Note   Patient: Destiny Tucker           Date of Birth: 1956-03-29           MRN: [id number] Visit Date: 07/04/2024 Requested by: Jolee Madelin Patch, MD [address] [place],  [place] [address] PCP: Jolee Madelin Patch, MD  Subjective: Chief Complaint  Patient presents with   Left Knee - Pain   Right Knee - Pain    HPI: Jeffifer Rabold is a 68 y.o. female who presents to the office reporting right hand pain and bilateral knee pain.  She describes bilateral knee pain from her knee arthritis.  Gel injections did not help and Toradol injections were short-lived.  She has had cortisone injections in the past which were somewhat helpful.  She has stiffness and difficulty with getting up from seated position.  No groin pain.  She also is here for right hand pain.  Recently had nerve conduction study by Dr. Eldonna demonstrating moderate to severe right carpal tunnel syndrome.  She describes pain in the median nerve distribution as well as pain at the base of her right thumb.  Pain is worse with activity but sometimes it will just randomly bother her even if she is not very active with her hand.  She wakes up from sleep about 2-3 nights out of the week.  Recently change statin drug which has helped her energy but not her joint pains..                ROS: All systems reviewed are negative as they relate to the chief complaint within the history of present illness.  Patient denies fevers or chills.  Assessment & Plan: Visit Diagnoses:  1. Numbness of right hand   2. Pain in right hand   3. Primary osteoarthritis of both knees     Plan: Impression is 68 year old female with history of bilateral knee arthritis.  We discussed options available to patient now that she has failed gel and Toradol injections.  She would like to try cortisone injections at some point in the future but for now we will plan to try physical therapy for 2-3 sessions to design  aquatic therapy exercise program to see if this will be helpful for her as an adjunct.  She is going to see Dr Dolphus soon and is interested to see if she can offer any new avenues for improvement in her joint pains.  Regarding her right hand pain, seems to be a combination of likely first [facility] arthritis and right carpal tunnel syndrome rated moderate to severe.  We discussed carpal tunnel release which she would like to hold off on for now but she is going to consider this in the near future.  She understands that for patients with severe carpal tunnel syndrome, prolonging surgery can lead to lingering symptoms long-term.  Patient will call when she wants to schedule surgery.  Follow-Up Instructions: No follow-ups on file.   Orders:  No orders of the defined types were placed in this encounter.  No orders of the defined types were placed in this encounter.     Procedures: No procedures performed   Clinical Data: No additional findings.  Objective: Vital Signs: There were no vitals taken for this visit.  Physical Exam:  Constitutional: Patient appears well-developed HEENT:  Head: Normocephalic Eyes:EOM are normal Neck: Normal range of motion Cardiovascular: Normal rate Pulmonary/chest: Effort normal Neurologic: Patient is alert Skin: Skin is  warm Psychiatric: Patient has normal mood and affect  Ortho Exam: Ortho exam demonstrates bilateral knees with no cellulitis or skin changes around the knee region.  She has small effusion in the left knee and no effusion in the right knee today.  No calf tenderness.  Negative Homans' sign bilaterally.  Palpable pedal pulses.  Able to perform straight leg raise bilaterally.  Right hand with 2+ radial pulse of the right upper extremity.  Intact EPL, FPL, finger abduction.  She has prominence of the first [facility] joint with tenderness in this region as well as pain and crepitus reproduced with thumb circumduction.  Specialty Comments:  No  specialty comments available.  Imaging: No results found.   PMFS History: Patient Active Problem List   Diagnosis Date Noted   Closed fracture of left distal radius 08/01/2022   Arthritis 01/21/2020   Osteopenia 01/21/2020   Abnormal cervical Papanicolaou smear 12/22/2019   Chronic interstitial cystitis 12/22/2019   Genital herpes simplex 12/22/2019   Actinic keratosis 09/04/2019   Diffuse photodamage of skin 09/04/2019   History of nonmelanoma skin cancer 09/04/2019   Routine adult health maintenance 05/21/2017   Obesity 12/22/2016   Degenerative arthritis of knee 10/25/2016   Right knee pain 05/09/2016   Chronic pain of both knees 03/16/2016   Trochanteric bursitis of right hip 01/28/2016   Acute sinusitis 09/28/2015   Carpal tunnel syndrome 09/28/2015   Pre-employment health screening examination 05/26/2015   Muscle spasms of neck 03/09/2015   Bilateral lower extremity edema 03/09/2015   Degenerative arthritis of left knee 01/26/2015   Pronation deformity of both feet 01/26/2015   Bladder problem 08/02/2014   Type 2 diabetes mellitus (HCC) 08/30/2010   Hyperlipidemia 05/18/2010   Depression 05/18/2010   Hypertensive disorder 05/18/2010   Allergic rhinitis 05/18/2010   TOBACCO USE, QUIT 05/18/2010   HYSTEROSCOPY, HX OF 05/18/2010   Anxiety 10/26/2008   Hemorrhoids, internal 10/26/2008   Disorder of bone and cartilage 10/26/2008   Past Medical History:  Diagnosis Date   Allergic rhinitis    Carpal tunnel syndrome    GAD (generalized anxiety disorder)    Genital herpes    Hemorrhoids    History of low-risk melanoma    per pt excision rectal/ perineal area (moh's per pt) yrs ago   History of SCC (squamous cell carcinoma) of skin 2010   Hyperlipidemia    Hypertension    Internal hemorrhoids without mention of complication    Left wrist fracture 07/2022   MDD (major depressive disorder)    OA (osteoarthritis)    Osteoporosis    Type 2 diabetes mellitus treated  with insulin [facility])    endocrinologist-- jamie huffman np;   checks blood sugar w/ Herlene,  (07-28-2022 pt stated fasting blood sugar 130--140)    Family History  Problem Relation Age of Onset   Dementia Mother    Alzheimer's disease Mother    Nephrolithiasis Father    Hypertension Father    Hyperlipidemia Father    Stroke Father    Aortic aneurysm Father    Diabetes Maternal Uncle     Past Surgical History:  Procedure Laterality Date   LAPAROSCOPIC SALPINGO OOPHERECTOMY Left 1983   LAPAROSCOPIC SALPINGOOPHERECTOMY Right 09/21/2004   @[facility]   by dr curlene;   EXTENSIVE LYSIS ADHESIONS AND D&C HYSTEROSCOPY   MOHS SURGERY     per pt yrs ago rectal / perineal area   ORIF WRIST FRACTURE Left 08/01/2022   Procedure: OPEN REDUCTION INTERNAL FIXATION (ORIF) WRIST FRACTURE;  Surgeon: Beverley Evalene BIRCH, MD;  Location[facility];  Service: Orthopedics;  Laterality: Left;   Social History   Occupational History   Occupation: Retired    Associate Professor: [employer]  Tobacco Use   Smoking status: Former    Current packs/day: 0.00    Types: Cigarettes    Start date: 1984    Quit date: 2009    Years since quitting: 16.6   Smokeless tobacco: Never  Vaping Use   Vaping status: Never Used  Substance and Sexual Activity   Alcohol use: Not Currently    Comment: rare   Drug use: Never   Sexual activity: Not Currently    Birth control/protection: Post-menopausal

## 2024-07-15 ENCOUNTER — Ambulatory Visit

## 2024-07-15 VITALS — BP 119/76 | HR 89 | Resp 16 | Ht 62.75 in | Wt 225.2 lb

## 2024-07-15 DIAGNOSIS — M1811 Unilateral primary osteoarthritis of first carpometacarpal joint, right hand: Secondary | ICD-10-CM

## 2024-07-15 DIAGNOSIS — Z79899 Other long term (current) drug therapy: Secondary | ICD-10-CM

## 2024-07-15 DIAGNOSIS — M170 Bilateral primary osteoarthritis of knee: Secondary | ICD-10-CM | POA: Diagnosis not present

## 2024-07-15 DIAGNOSIS — M180 Bilateral primary osteoarthritis of first carpometacarpal joints: Secondary | ICD-10-CM | POA: Diagnosis not present

## 2024-07-15 NOTE — Progress Notes (Unsigned)
 Office Visit Note  Patient: Destiny Tucker             Date of Birth: [birth year]           MRN: [id number]             PCP: Vernon Velna SAUNDERS, MD Referring: Addie Cordella Hamilton, MD Visit Date: 07/15/2024  Subjective:  New Patient (Initial Visit) (Knee pain, Had the drained a few times. Has had x-rays. Patient states she is having pain in her hands and trouble with gripping. Patient states she has trouble with stiffness when she sits for long periods of time. )   History of Present Illness: Destiny Tucker is a 68 y.o. female bilateral knee pains. She admits to issues with stiffness when she is sitting for extended periods of time. She admits to knee pain episodically for the past few years. She has had steroid injections in the knee, which has not provided any improvement.  She also admits to pain in bilateral CMC joints. She does admit to diagnosis of carpel tunnel in her right hand that causes numbness/tingling of the hand. She admits to issues with grip strength. She states that this has been gradual in the last year or two.   She admits to starting naproxen , but it does not work as well as Advil . She used to take Duexis  which worked really well, but no longer covered by insurance.   She admits to AM stiffness lasting 5-10 minutes. She admits to some swelling in her fingers.    She tried changing statin therapy to see if this would help, and she does feel like some of her sluggishness and overall joint pain has improved.    Activities of Daily Living:  Patient reports morning stiffness for 10 minutes.   Patient Reports nocturnal pain.  Difficulty dressing/grooming: Reports Difficulty climbing stairs: Reports Difficulty getting out of chair: Reports Difficulty using hands for taps, buttons, cutlery, and/or writing: Reports  Review of Systems  Constitutional:  Negative for fatigue.  HENT:  Negative for mouth sores and mouth dryness.   Eyes:  Negative for dryness.   Respiratory:  Negative for shortness of breath.   Cardiovascular:  Negative for chest pain and palpitations.  Gastrointestinal:  Negative for blood in stool, constipation and diarrhea.  Endocrine: Positive for increased urination.  Genitourinary:  Negative for involuntary urination.  Musculoskeletal:  Positive for joint pain, gait problem, joint pain, joint swelling, myalgias, muscle weakness, morning stiffness, muscle tenderness and myalgias.  Skin:  Positive for sensitivity to sunlight. Negative for color change, rash and hair loss.  Allergic/Immunologic: Positive for susceptible to infections.  Neurological:  Positive for dizziness. Negative for headaches.  Hematological:  Negative for swollen glands.  Psychiatric/Behavioral:  Positive for depressed mood. Negative for sleep disturbance. The patient is not nervous/anxious.      Rheum History: N/A   PMFS History:  Patient Active Problem List   Diagnosis Date Noted  . Closed fracture of left distal radius 08/01/2022  . Arthritis 01/21/2020  . Osteopenia 01/21/2020  . Abnormal cervical Papanicolaou smear 12/22/2019  . Chronic interstitial cystitis 12/22/2019  . Genital herpes simplex 12/22/2019  . Actinic keratosis 09/04/2019  . Diffuse photodamage of skin 09/04/2019  . History of nonmelanoma skin cancer 09/04/2019  . Routine adult health maintenance 05/21/2017  . Obesity 12/22/2016  . Degenerative arthritis of knee 10/25/2016  . Right knee pain 05/09/2016  . Chronic pain of both knees 03/16/2016  . Trochanteric bursitis of right  hip 01/28/2016  . Acute sinusitis 09/28/2015  . Carpal tunnel syndrome 09/28/2015  . Pre-employment health screening examination 05/26/2015  . Muscle spasms of neck 03/09/2015  . Bilateral lower extremity edema 03/09/2015  . Degenerative arthritis of left knee 01/26/2015  . 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 Bladder problem 08/02/2014  . Type 2 diabetes mellitus (HCC) 08/30/2010  .  Hyperlipidemia 05/18/2010  . Depression 05/18/2010  . Hypertensive disorder 05/18/2010  . Allergic rhinitis 05/18/2010  . TOBACCO USE, QUIT 05/18/2010  . HYSTEROSCOPY, HX OF 05/18/2010  . Anxiety 10/26/2008  . Hemorrhoids, internal 10/26/2008  . Disorder of bone and cartilage 10/26/2008    Past Medical History:  Diagnosis Date  . Allergic rhinitis   . Carpal tunnel syndrome   . GAD (generalized anxiety disorder)   . Genital herpes   . Hemorrhoids   . History of low-risk melanoma    per pt excision rectal/ perineal area (moh's per pt) yrs ago  . History of SCC (squamous cell carcinoma) of skin 2010  . Hyperlipidemia   . Hypertension   . Internal hemorrhoids without mention of complication   . Left wrist fracture 07/2022  . MDD (major depressive disorder)   . OA (osteoarthritis)   . Osteoporosis   . Type 2 diabetes mellitus treated with insulin  [facility])    endocrinologist-- jamie huffman np;   checks blood sugar w/ Herlene,  (07-28-2022 pt stated fasting blood sugar 130--140)    Family History  Problem Relation Age of Onset  . Dementia Mother   . Alzheimer's disease Mother   . Nephrolithiasis Father   . Hypertension Father   . Hyperlipidemia Father   . Stroke Father   . Aortic aneurysm Father   . Cancer Sister   . Alcoholism Sister   . Hypertension Sister   . Osteoarthritis Sister   . Hypertension Brother   . Kidney failure Brother   . Diabetes Maternal Uncle    Past Surgical History:  Procedure Laterality Date  . LAPAROSCOPIC SALPINGO OOPHERECTOMY Left 1983  . LAPAROSCOPIC SALPINGOOPHERECTOMY Right 09/21/2004   @[facility]   by dr tomblin;   EXTENSIVE LYSIS ADHESIONS AND D&C HYSTEROSCOPY  . MOHS SURGERY     per pt yrs ago rectal / perineal area  . ORIF WRIST FRACTURE Left 08/01/2022   Procedure: OPEN REDUCTION INTERNAL FIXATION (ORIF) WRIST FRACTURE;  Surgeon: Beverley Evalene BIRCH, MD;  [facility];  Service: Orthopedics;  Laterality: Left;   Social  History   Social History Narrative  . Not on file   Immunization History  Administered Date(s) Administered  . Fluad Quad(high Dose 65+) 07/14/2020  . Influenza Whole 08/20/2009  . Influenza,inj,Quad PF,6+ Mos 09/15/2015, 10/05/2016, 09/06/2017, 10/10/2018  . Influenza-Unspecified 09/20/2013  . PFIZER(Purple Top)SARS-COV-2 Vaccination 02/19/2020, 03/15/2020  . PPD Test 05/28/2015  . Pneumococcal Polysaccharide-23 10/03/2013  . Td 11/20/2006  . Tdap 05/21/2017     Objective: Vital Signs: BP 119/76 (BP Location: Right Arm, Patient Position: Sitting, Cuff Size: Large)   Pulse 89   Resp 16   Ht 5' 2.75 (1.594 m)   Wt 225 lb 3.2 oz (102.2 kg)   BMI 40.21 kg/m    Physical Exam   Musculoskeletal Exam: ***  CDAI Exam: CDAI Score: -- Patient Global: --; Provider Global: -- Swollen: --; Tender: -- Joint Exam 07/15/2024   No joint exam has been documented for this visit   There is currently no information documented on the homunculus. Go to the Rheumatology activity and  complete the homunculus joint exam.  Investigation: No additional findings.  Imaging: No results found.  Recent Labs: Lab Results  Component Value Date   WBC 11.4 (H) 10/10/2018   HGB 15.6 (H) 08/01/2022   PLT 302.0 10/10/2018   NA 140 08/01/2022   K 4.2 08/01/2022   CL 103 08/01/2022   CO2 27 10/10/2018   GLUCOSE 133 (H) 08/01/2022   BUN 28 (H) 08/01/2022   CREATININE 0.50 08/01/2022   BILITOT 0.4 10/10/2018   ALKPHOS 71 10/10/2018   AST 17 10/10/2018   ALT 24 10/10/2018   PROT 7.7 10/10/2018   ALBUMIN 4.2 10/10/2018   CALCIUM 10.1 10/10/2018   B/L Knee XR personally received and interpreted. Based on my personal interpretation, patient with evidence of severe medial joint space narrowing of both knees.   Speciality Comments: No specialty comments available.  Procedures:  No procedures performed Allergies: Codeine and Sulfa antibiotics   Assessment / Plan:     Visit  Diagnoses:  #Bilateral primary osteoarthritis of knee #Arthritis of carpometacarpal (CMC) joints of both thumbs Likely primary, multiple sites. This diagnosis is supported by advanced age, bilateral CMC squaring, Heberden's and Bouchard's nodes, absence of morning stiffness, and intensification of pain with activity.   No concern for underlying inflammatory arthritis due to the patient's presentation. PLAN: -Topical Voltaren  gel BID PRN -Tylenol  1g TID as needed or 650 QID PRN -Physical therapy referral -Occupational/hand therapy referral -Discussed range of motion and dexterity exercises, passive stretching of hands/wrists. Heat therapy with Paraffin wax or heated gloves -DME order for 1st [facility] commercial spica splint/custom made splint  -NSAIDs for hip and knee OA (topical if oral unsafe) -Tramadol if NSAIDs contraindicated/ineffective -Intra-articular glucocorticoids for acute exacerbations of knee OA -Quadriceps strengthening exercises for knee OA -Weight loss for hip and knee OA -If no response to nonsurgical treatment, joint arthroplasty of hip or knee indicated    Patient with bilateral knee pain that appears to be non-   #High risk medication use Patient reports resolution of joint pain with Duexis  in the past. Discussed with patient that Duexis  is a combination medication consisting of Advil  800mg  and Pepcid  26.6mg . Discussed that patient could obtain Advil  and Pepcid  over-the-counter given Duexis  is typically not approved by insurance. Discussed NSAIDs are an option for treatment of OA, and risks of NSAID use include, but are not limited to, kidney injury, elevation in liver enzymes, GI side effects.  Patient verbalizes understanding.  Orders: No orders of the defined types were placed in this encounter.  No orders of the defined types were placed in this encounter.   Face-to-face time spent with patient was 60 minutes. Greater than 50% of time was spent in counseling and  coordination of care.  Follow-Up Instructions: Return if symptoms worsen or fail to improve.   Asberry Claw, DO

## 2024-07-22 DIAGNOSIS — L918 Other hypertrophic disorders of the skin: Secondary | ICD-10-CM | POA: Diagnosis not present

## 2024-07-22 DIAGNOSIS — L900 Lichen sclerosus et atrophicus: Secondary | ICD-10-CM | POA: Diagnosis not present

## 2024-07-22 DIAGNOSIS — I872 Venous insufficiency (chronic) (peripheral): Secondary | ICD-10-CM | POA: Diagnosis not present

## 2024-07-22 DIAGNOSIS — L570 Actinic keratosis: Secondary | ICD-10-CM | POA: Diagnosis not present

## 2024-07-22 DIAGNOSIS — I8312 Varicose veins of left lower extremity with inflammation: Secondary | ICD-10-CM | POA: Diagnosis not present

## 2024-07-22 DIAGNOSIS — I8311 Varicose veins of right lower extremity with inflammation: Secondary | ICD-10-CM | POA: Diagnosis not present

## 2024-07-22 DIAGNOSIS — D2262 Melanocytic nevi of left upper limb, including shoulder: Secondary | ICD-10-CM | POA: Diagnosis not present

## 2024-07-22 DIAGNOSIS — L821 Other seborrheic keratosis: Secondary | ICD-10-CM | POA: Diagnosis not present

## 2024-07-22 DIAGNOSIS — Q825 Congenital non-neoplastic nevus: Secondary | ICD-10-CM | POA: Diagnosis not present

## 2024-07-22 DIAGNOSIS — D2261 Melanocytic nevi of right upper limb, including shoulder: Secondary | ICD-10-CM | POA: Diagnosis not present

## 2024-07-22 DIAGNOSIS — D225 Melanocytic nevi of trunk: Secondary | ICD-10-CM | POA: Diagnosis not present

## 2024-07-22 DIAGNOSIS — Z85828 Personal history of other malignant neoplasm of skin: Secondary | ICD-10-CM | POA: Diagnosis not present

## 2024-07-25 ENCOUNTER — Encounter (HOSPITAL_BASED_OUTPATIENT_CLINIC_OR_DEPARTMENT_OTHER): Payer: Self-pay

## 2024-07-25 ENCOUNTER — Ambulatory Visit (HOSPITAL_BASED_OUTPATIENT_CLINIC_OR_DEPARTMENT_OTHER): Admit: 2024-07-25 | Admitting: Plastic Surgery

## 2024-07-25 SURGERY — BLEPHAROPLASTY
Anesthesia: General | Site: Eye | Laterality: Bilateral

## 2024-07-28 DIAGNOSIS — E1169 Type 2 diabetes mellitus with other specified complication: Secondary | ICD-10-CM | POA: Diagnosis not present

## 2024-09-04 DIAGNOSIS — L309 Dermatitis, unspecified: Secondary | ICD-10-CM | POA: Diagnosis not present

## 2024-09-04 DIAGNOSIS — Z85828 Personal history of other malignant neoplasm of skin: Secondary | ICD-10-CM | POA: Diagnosis not present

## 2024-09-22 ENCOUNTER — Encounter: Payer: Self-pay | Admitting: Radiology

## 2024-09-23 ENCOUNTER — Encounter (HOSPITAL_BASED_OUTPATIENT_CLINIC_OR_DEPARTMENT_OTHER): Payer: Self-pay | Admitting: Plastic Surgery

## 2024-09-23 ENCOUNTER — Other Ambulatory Visit: Payer: Self-pay

## 2024-09-23 NOTE — Progress Notes (Signed)
   09/23/24 1135  PAT Phone Screen  Is the patient taking a GLP-1 receptor agonist? (S)  Yes (Mounjaro  LD 09-21-24)  Has the patient been informed on holding medication? Yes  Do You Have Diabetes? Yes  Do You Have Hypertension? Yes  Have You Ever Been to the ER for Asthma? No  Have You Taken Oral Steroids in the Past 3 Months? No  Do you Take Phenteramine or any Other Diet Drugs? No  Recent  Lab Work, EKG, CXR? No  Do you have a history of heart problems? No  Any Recent Hospitalizations? No  Height 5' 2 (1.575 m)  Weight 102.1 kg  Pat Appointment Scheduled (S)  Yes (EKG, BMP)

## 2024-09-24 ENCOUNTER — Other Ambulatory Visit: Payer: Self-pay

## 2024-09-24 ENCOUNTER — Encounter (HOSPITAL_BASED_OUTPATIENT_CLINIC_OR_DEPARTMENT_OTHER)
Admission: RE | Admit: 2024-09-24 | Discharge: 2024-09-24 | Disposition: A | Discharge status: Home / Self Care | Source: Ambulatory Visit | Attending: Plastic Surgery | Admitting: Plastic Surgery

## 2024-09-24 ENCOUNTER — Encounter (HOSPITAL_BASED_OUTPATIENT_CLINIC_OR_DEPARTMENT_OTHER)

## 2024-09-24 DIAGNOSIS — Z01818 Encounter for other preprocedural examination: Secondary | ICD-10-CM | POA: Insufficient documentation

## 2024-09-24 LAB — BASIC METABOLIC PANEL WITH GFR
Anion gap: 10 (ref 5–15)
BUN: 19 mg/dL (ref 8–23)
CO2: 24 mmol/L (ref 22–32)
Calcium: 9.7 mg/dL (ref 8.9–10.3)
Chloride: 103 mmol/L (ref 98–111)
Creatinine, Ser: 0.49 mg/dL (ref 0.44–1.00)
GFR, Estimated: >60 mL/min (ref >60)
Glucose, Bld: 117 mg/dL — ABNORMAL HIGH (ref 70–99)
Potassium: 4 mmol/L (ref 3.5–5.1)
Sodium: 137 mmol/L (ref 135–145)

## 2024-09-24 NOTE — Progress Notes (Signed)
 G2 given (drink 0945 DOS) with written/verbal instruciton         Enhanced Recovery after Surgery for Orthopedics Enhanced Recovery after Surgery is a protocol used to improve the stress on your body and your recovery after surgery.  Patient Instructions  The night before surgery:  No food after midnight. ONLY clear liquids after midnight  The day of surgery (if you do NOT have diabetes):  Drink ONE (1) Pre-Surgery Clear Ensure as directed.   This drink was given to you during your hospital  pre-op appointment visit. The pre-op nurse will instruct you on the time to drink the  Pre-Surgery Ensure depending on your surgery time. Finish the drink at the designated time by the pre-op nurse.  Nothing else to drink after completing the  Pre-Surgery Clear Ensure.  The day of surgery (if you have diabetes): Drink ONE (1) Gatorade 2 (G2) as directed. This drink was given to you during your hospital  pre-op appointment visit.  The pre-op nurse will instruct you on the time to drink the   Gatorade 2 (G2) depending on your surgery time. Color of the Gatorade may vary. Red is not allowed. Nothing else to drink after completing the  Gatorade 2 (G2).         If you have questions, please contact your surgeon's office.

## 2024-09-24 NOTE — Pre-Procedure Instructions (Signed)

## 2024-09-24 NOTE — H&P (Signed)
 Subjective Patient ID: Destiny Tucker is a 68 y.o. female.     HPI   Returns for follow up discussion prior to planned bilateral ptosis repair. Reports interference with vision that impacts reading and driving, worse over day with brow fatigue. Patient denies dry eyes or tearing.  Wears glasses for night driving.   PMH significant for DM on insulin , HbA1c per patient 5.27 January 2024. (PCP is at [facility]). On Mounjauro   On semaglutide for year, reports no significant weight loss on this.   Retired from [employer]. Also worked and [employer]. Lives with spouse.   Review of Systems  HENT:  Positive for sinus pressure.   Endocrine: Positive for polydipsia.  Musculoskeletal:  Positive for arthralgias.  Allergic/Immunologic: Positive for environmental allergies.  Neurological:  Positive for numbness.  All other systems reviewed and are negative.    Objective Physical Exam  Cardiovascular: Normal rate, regular rhythm and normal heart sounds.    Pulmonary/Chest Effort normal and breath sounds normal.   HEENT: - Bells, levator excursion > 12 mm bilateral With frontalis blocked, small excess upper eyelid skin noted, bilateral ptosis eyelids MRD< 2mm bilateral   Assessment/Plan Diagnoses and all orders for this visit:   Ptosis bilateral upper eyelids   Plan bilateral ptosis repair via suture technique. Hold Mounjaro week prior to surgery. Instructed to hold naproxen  10 d prior to surgery.   Reviewed causes of ptosis. Reviewed surgery via upper eyelid incision to access levator muscle and muscle advancement vs plication. Reviewed OP surgery, GA, post operative pain, limitations, sutures, bruising. Counseled most common complication as asymmetry and need for revision. This can mean additional surgery. Reviewed some over correction at time of surgery is planned. Reviewed additional risks bleeding, blindness, exacerbation dry eyes, changes with aging.    Rx for Norco  given.   Earlis Ranks, MD [facility] Plastic [facility] Re[facility] Surgery  Office/ physician access line after hours [phone]

## 2024-09-30 ENCOUNTER — Ambulatory Visit (HOSPITAL_BASED_OUTPATIENT_CLINIC_OR_DEPARTMENT_OTHER): Admitting: Anesthesiology

## 2024-09-30 ENCOUNTER — Other Ambulatory Visit: Payer: Self-pay

## 2024-09-30 ENCOUNTER — Ambulatory Visit (HOSPITAL_BASED_OUTPATIENT_CLINIC_OR_DEPARTMENT_OTHER)
Admission: RE | Admit: 2024-09-30 | Discharge: 2024-09-30 | Disposition: A | Discharge status: Home / Self Care | Attending: Plastic Surgery | Admitting: Plastic Surgery

## 2024-09-30 ENCOUNTER — Encounter (HOSPITAL_BASED_OUTPATIENT_CLINIC_OR_DEPARTMENT_OTHER)
Admission: RE | Disposition: A | Discharge status: Home / Self Care | Payer: Self-pay | Source: Home / Self Care | Attending: Plastic Surgery

## 2024-09-30 ENCOUNTER — Encounter (HOSPITAL_BASED_OUTPATIENT_CLINIC_OR_DEPARTMENT_OTHER): Payer: Self-pay | Admitting: Plastic Surgery

## 2024-09-30 DIAGNOSIS — E66813 Obesity, class 3: Secondary | ICD-10-CM | POA: Insufficient documentation

## 2024-09-30 DIAGNOSIS — Z794 Long term (current) use of insulin: Secondary | ICD-10-CM | POA: Insufficient documentation

## 2024-09-30 DIAGNOSIS — M1990 Unspecified osteoarthritis, unspecified site: Secondary | ICD-10-CM | POA: Diagnosis not present

## 2024-09-30 DIAGNOSIS — Z6841 Body Mass Index (BMI) 40.0 and over, adult: Secondary | ICD-10-CM | POA: Insufficient documentation

## 2024-09-30 DIAGNOSIS — Z87891 Personal history of nicotine dependence: Secondary | ICD-10-CM | POA: Diagnosis not present

## 2024-09-30 DIAGNOSIS — H02403 Unspecified ptosis of bilateral eyelids: Secondary | ICD-10-CM | POA: Insufficient documentation

## 2024-09-30 DIAGNOSIS — I10 Essential (primary) hypertension: Secondary | ICD-10-CM

## 2024-09-30 DIAGNOSIS — F418 Other specified anxiety disorders: Secondary | ICD-10-CM

## 2024-09-30 DIAGNOSIS — Z7985 Long-term (current) use of injectable non-insulin antidiabetic drugs: Secondary | ICD-10-CM | POA: Insufficient documentation

## 2024-09-30 DIAGNOSIS — E119 Type 2 diabetes mellitus without complications: Secondary | ICD-10-CM | POA: Insufficient documentation

## 2024-09-30 HISTORY — PX: PTOSIS REPAIR: SHX6568

## 2024-09-30 LAB — GLUCOSE, CAPILLARY
Glucose-Capillary: 112 mg/dL — ABNORMAL HIGH (ref 70–99)
Glucose-Capillary: 124 mg/dL — ABNORMAL HIGH (ref 70–99)

## 2024-09-30 SURGERY — REPAIR, BLEPHAROPTOSIS
Anesthesia: General | Site: Eye | Laterality: Bilateral

## 2024-09-30 MED ORDER — EPHEDRINE SULFATE (PRESSORS) 25 MG/5ML IV SOSY
PREFILLED_SYRINGE | INTRAVENOUS | Status: DC | PRN
  Administered 2024-09-30: 10 mg via INTRAVENOUS

## 2024-09-30 MED ORDER — LIDOCAINE HCL (CARDIAC) PF 100 MG/5ML IV SOSY
PREFILLED_SYRINGE | INTRAVENOUS | Status: DC | PRN
  Administered 2024-09-30 (×2): 50 mg via INTRAVENOUS

## 2024-09-30 MED ORDER — BSS IO SOLN
INTRAOCULAR | Status: AC
Start: 2024-09-30 — End: 2024-09-30
  Filled 2024-09-30: qty 15

## 2024-09-30 MED ORDER — ACETAMINOPHEN 500 MG PO TABS
1000.0000 mg | ORAL_TABLET | Freq: Once | ORAL | Status: AC
  Administered 2024-09-30: 1000 mg via ORAL

## 2024-09-30 MED ORDER — FENTANYL CITRATE (PF) 100 MCG/2ML IJ SOLN
INTRAMUSCULAR | Status: DC | PRN
  Administered 2024-09-30 (×2): 25 ug via INTRAVENOUS

## 2024-09-30 MED ORDER — PROPOFOL 10 MG/ML IV BOLUS
INTRAVENOUS | Status: DC | PRN
  Administered 2024-09-30: 150 mg via INTRAVENOUS

## 2024-09-30 MED ORDER — OXYCODONE HCL 5 MG PO TABS
5.0000 mg | ORAL_TABLET | Freq: Once | ORAL | Status: AC | PRN
  Administered 2024-09-30: 5 mg via ORAL

## 2024-09-30 MED ORDER — FENTANYL CITRATE (PF) 100 MCG/2ML IJ SOLN
25.0000 ug | INTRAMUSCULAR | Status: DC | PRN
  Administered 2024-09-30 (×2): 50 ug via INTRAVENOUS

## 2024-09-30 MED ORDER — TOBRAMYCIN-DEXAMETHASONE 0.3-0.1 % OP OINT
TOPICAL_OINTMENT | OPHTHALMIC | Status: AC
  Filled 2024-09-30: qty 3.5

## 2024-09-30 MED ORDER — FENTANYL CITRATE (PF) 100 MCG/2ML IJ SOLN
INTRAMUSCULAR | Status: AC
  Filled 2024-09-30: qty 2

## 2024-09-30 MED ORDER — CEFAZOLIN SODIUM-DEXTROSE 2-4 GM/100ML-% IV SOLN
INTRAVENOUS | Status: AC
  Filled 2024-09-30: qty 100

## 2024-09-30 MED ORDER — POVIDONE-IODINE 5 % OP SOLN
OPHTHALMIC | Status: AC
Start: 2024-09-30 — End: 2024-09-30
  Filled 2024-09-30: qty 30

## 2024-09-30 MED ORDER — MIDAZOLAM HCL 5 MG/5ML IJ SOLN
INTRAMUSCULAR | Status: DC | PRN
  Administered 2024-09-30: 1 mg via INTRAVENOUS

## 2024-09-30 MED ORDER — LACTATED RINGERS IV SOLN: INTRAVENOUS | Status: DC

## 2024-09-30 MED ORDER — ONDANSETRON HCL 4 MG/2ML IJ SOLN
INTRAMUSCULAR | Status: DC | PRN
  Administered 2024-09-30: 4 mg via INTRAVENOUS

## 2024-09-30 MED ORDER — LIDOCAINE-EPINEPHRINE 1 %-1:100000 IJ SOLN
INTRAMUSCULAR | Status: DC | PRN
  Administered 2024-09-30: 6.5 mL

## 2024-09-30 MED ORDER — ACETAMINOPHEN 500 MG PO TABS
ORAL_TABLET | ORAL | Status: AC
  Filled 2024-09-30: qty 2

## 2024-09-30 MED ORDER — BSS IO SOLN
INTRAOCULAR | Status: DC | PRN
  Administered 2024-09-30: 1 via INTRAOCULAR

## 2024-09-30 MED ORDER — TOBRAMYCIN-DEXAMETHASONE 0.3-0.1 % OP SUSP
OPHTHALMIC | Status: AC
  Filled 2024-09-30: qty 2.5

## 2024-09-30 MED ORDER — PROPOFOL 10 MG/ML IV BOLUS
INTRAVENOUS | Status: AC
  Filled 2024-09-30: qty 20

## 2024-09-30 MED ORDER — AMISULPRIDE (ANTIEMETIC) 5 MG/2ML IV SOLN: 10.0000 mg | Freq: Once | INTRAVENOUS | Status: DC | PRN

## 2024-09-30 MED ORDER — DEXAMETHASONE SODIUM PHOSPHATE 4 MG/ML IJ SOLN
INTRAMUSCULAR | Status: DC | PRN
  Administered 2024-09-30: 4 mg via INTRAVENOUS

## 2024-09-30 MED ORDER — MIDAZOLAM HCL 2 MG/2ML IJ SOLN
INTRAMUSCULAR | Status: AC
  Filled 2024-09-30: qty 2

## 2024-09-30 MED ORDER — 0.9 % SODIUM CHLORIDE (POUR BTL) OPTIME
TOPICAL | Status: DC | PRN
  Administered 2024-09-30: 1000 mL

## 2024-09-30 MED ORDER — TOBRAMYCIN-DEXAMETHASONE 0.3-0.1 % OP OINT
TOPICAL_OINTMENT | OPHTHALMIC | Status: DC | PRN
  Administered 2024-09-30: 1 via OPHTHALMIC

## 2024-09-30 MED ORDER — OXYCODONE HCL 5 MG/5ML PO SOLN: 5.0000 mg | Freq: Once | ORAL | Status: AC | PRN

## 2024-09-30 MED ORDER — OXYCODONE HCL 5 MG PO TABS
ORAL_TABLET | ORAL | Status: AC
  Filled 2024-09-30: qty 1

## 2024-09-30 MED ORDER — PHENYLEPHRINE HCL (PRESSORS) 10 MG/ML IV SOLN
INTRAVENOUS | Status: DC | PRN
  Administered 2024-09-30 (×3): 80 ug via INTRAVENOUS

## 2024-09-30 MED ORDER — CEFAZOLIN SODIUM-DEXTROSE 2-4 GM/100ML-% IV SOLN
2.0000 g | INTRAVENOUS | Status: AC
  Administered 2024-09-30: 2 g via INTRAVENOUS

## 2024-09-30 SURGICAL SUPPLY — 29 items
APPLICATOR COTTON TIP 6 STRL (MISCELLANEOUS) ×2 IMPLANT
BLADE SURG 15 STRL LF DISP TIS (BLADE) ×2 IMPLANT
COVER BACK TABLE 60X90IN (DRAPES) ×2 IMPLANT
COVER MAYO STAND STRL (DRAPES) ×2 IMPLANT
DRAPE U-SHAPE 76X120 STRL (DRAPES) ×2 IMPLANT
DRAPE UTILITY XL STRL (DRAPES) IMPLANT
ELECT NDL BLADE 2-5/6 (NEEDLE) ×2 IMPLANT
ELECT NEEDLE BLADE 2-5/6 (NEEDLE) ×1 IMPLANT
ELECTRODE REM PT RTRN 9FT ADLT (ELECTROSURGICAL) ×2 IMPLANT
GLOVE BIO SURGEON STRL SZ 6 (GLOVE) ×2 IMPLANT
GOWN STRL REUS W/ TWL LRG LVL3 (GOWN DISPOSABLE) ×4 IMPLANT
NDL HYPO 30GX1 BEV (NEEDLE) ×2 IMPLANT
NEEDLE HYPO 30GX1 BEV (NEEDLE) ×1 IMPLANT
PACK BASIN DAY SURGERY FS (CUSTOM PROCEDURE TRAY) ×2 IMPLANT
PENCIL SMOKE EVACUATOR (MISCELLANEOUS) ×2 IMPLANT
SHIELD EYE MED CORNL SHD 22X21 (OPHTHALMIC RELATED) ×4 IMPLANT
SLEEVE SCD COMPRESS KNEE MED (STOCKING) ×2 IMPLANT
SPIKE FLUID TRANSFER (MISCELLANEOUS) ×2 IMPLANT
STAPLER SKIN PROX WIDE 3.9 (STAPLE) IMPLANT
SUT 6 0 SILK T G140 8DA (SUTURE) IMPLANT
SUT MERSILENE 4 0 P 3 (SUTURE) IMPLANT
SUT MERSILENE 5 0 P 3 (SUTURE) IMPLANT
SUT PROLENE 6 0 P 1 18 (SUTURE) IMPLANT
SUT VIC AB 5-0 P-3 18X BRD (SUTURE) IMPLANT
SUT VICRYL RAPIDE 4-0 (SUTURE) IMPLANT
SUTURE MERSLN 6-0 18IN S14 8MM (SUTURE) IMPLANT
SYR CONTROL 10ML LL (SYRINGE) ×2 IMPLANT
TOWEL GREEN STERILE FF (TOWEL DISPOSABLE) ×2 IMPLANT
TRAY DSU PREP LF (CUSTOM PROCEDURE TRAY) ×2 IMPLANT

## 2024-09-30 NOTE — Anesthesia Preprocedure Evaluation (Addendum)
 Anesthesia Evaluation  Patient identified by MRN, date of birth, ID band Patient awake    Reviewed: Allergy & Precautions, NPO status , Patient's Chart, lab work & pertinent test results  Airway Mallampati: III  TM Distance: >3 FB Neck ROM: Full    Dental no notable dental hx. (+) Teeth Intact, Dental Advisory Given   Pulmonary former smoker   Pulmonary exam normal breath sounds clear to auscultation       Cardiovascular hypertension, Normal cardiovascular exam Rhythm:Regular Rate:Normal     Neuro/Psych  PSYCHIATRIC DISORDERS Anxiety Depression    negative neurological ROS     GI/Hepatic negative GI ROS, Neg liver ROS,,,  Endo/Other  diabetes, Type 2, Oral Hypoglycemic Agents, Insulin  Dependent  Class 3 obesity (BMI 41)  Renal/GU negative Renal ROS  negative genitourinary   Musculoskeletal  (+) Arthritis ,    Abdominal   Peds  Hematology negative hematology ROS (+)   Anesthesia Other Findings   Reproductive/Obstetrics                              Anesthesia Physical Anesthesia Plan  ASA: 3  Anesthesia Plan: General   Post-op Pain Management: Tylenol  PO (pre-op)*   Induction: Intravenous  PONV Risk Score and Plan: 3 and Ondansetron , Dexamethasone  and Midazolam   Airway Management Planned: LMA  Additional Equipment:   Intra-op Plan:   Post-operative Plan: Extubation in OR  Informed Consent: I have reviewed the patients History and Physical, chart, labs and discussed the procedure including the risks, benefits and alternatives for the proposed anesthesia with the patient or authorized representative who has indicated his/her understanding and acceptance.     Dental advisory given  Plan Discussed with: CRNA  Anesthesia Plan Comments:          Anesthesia Quick Evaluation

## 2024-09-30 NOTE — Interval H&P Note (Signed)
 History and Physical Interval Note:  09/30/2024 12:08 PM  Destiny Tucker  has presented today for surgery, with the diagnosis of bilateral upper eyelid ptosis.  The various methods of treatment have been discussed with the patient and family. After consideration of risks, benefits and other options for treatment, the patient has consented to  Procedure(s) with comments: REPAIR, BLEPHAROPTOSIS (Bilateral) - *BILATERAL PTOSIS REPAIR EXTERNAL APPROACH SUTURE TECHNIQUE** as a surgical intervention.  The patient's history has been reviewed, patient examined, no change in status, stable for surgery.  I have reviewed the patient's chart and labs.  Questions were answered to the patient's satisfaction.     Earlis Derek Laughter

## 2024-09-30 NOTE — Op Note (Signed)
 Operative Note   DATE OF OPERATION: 11.11.2025  LOCATION: [facility]-outpatient  SURGICAL DIVISION: Plastic Surgery  PREOPERATIVE DIAGNOSES:  Ptosis bilateral upper eyelids  POSTOPERATIVE DIAGNOSES:  same  PROCEDURE:  Bilateral upper eyelid external approach suture repair ptosis  SURGEON: Earlis Ranks MD MBA  ASSISTANT: none  ANESTHESIA:  General.   EBL: 10 ml  COMPLICATIONS: None immediate.   INDICATIONS FOR PROCEDURE:  The patient, Destiny Tucker, is a 68 y.o. female born on [birth year], is here for treatment bilateral ptosis with obstructed visual fields.    FINDINGS: Suture suspension tarsal plate to levator aponeurosis completed.   DESCRIPTION OF PROCEDURE:  The patient's operative site was confirmed with the patient in the preoperative area. The patient was taken to the operating room. SCDs were placed and IV antibiotics were given. Opthalmic lubricant and scleral shields placed bilateral. The patient's operative site was prepped and draped in a sterile fashion. A time out was performed and all information was confirmed to be correct.    Caudal border for resection marked 7 mm from lash line bilateral. Cephalic extent resection marked at junction brow and eyelid skin. Local anesthetic infiltrated to perform bilateral supraorbital and infraorbital nerve blocks and within area of planned resection. I began on right side. Incision completed sharply. Skin only excision completed with aid of cautery. Hemostasis obtained. Incision carried through orbicularis oculi muscle to identify tarsal plate. Dissection completed superiorly to identify levator aponeurosis. 6-0 Mersilene suture placed from tarsal plate at mid pupillary line to aponeurosis. Additional suture placed just lateral to this. The conjunctiva was checked to ensure suture placement not through conjunctival. Skin closure completed with 6-0 prolene interrupted and running.    I then directed attention to left side.  Incision completed sharply. Skin only excision completed with aid of cautery. Incision carried through orbicularis oculi muscle to identify tarsal plate. Dissection completed superiorly to identify levator aponeurosis. 6-0 Mersilene suture placed from tarsal plate at mid pupillary line to aponeurosis. Additional suture placed just lateral to this. The conjunctiva was checked to ensure suture placement not through conjunctival. Skin closure completed with simple interrupted and running 6-0 prolene.   Scleral shields removed and eyes irrigated with basic salt solution. Tobradex ointment placed in each eye.  The patient was allowed to wake from anesthesia, extubated and taken to the recovery room in satisfactory condition.   SPECIMENS: none  DRAINS: none  Earlis Ranks, MD [facility] Plastic & Reconstructive Surgery  Office/ physician access line after hours [phone]

## 2024-09-30 NOTE — Transfer of Care (Signed)
 Immediate Anesthesia Transfer of Care Note  Patient: Destiny Tucker  Procedure(s) Performed: REPAIR, BLEPHAROPTOSIS (Bilateral: Eye)  Patient Location: PACU  Anesthesia Type:General  Level of Consciousness: oriented  Airway & Oxygen Therapy: Patient Spontanous Breathing  Post-op Assessment: Report given to RN and Post -op Vital signs reviewed and stable  Post vital signs: Reviewed and stable  Last Vitals:  Vitals Value Taken Time  BP 127/86 09/30/24 14:30  Temp 36.2 C 09/30/24 14:24  Pulse 93 09/30/24 14:32  Resp 15 09/30/24 14:32  SpO2 95 % 09/30/24 14:32  Vitals shown include unfiled device data.  Last Pain:  Vitals:   09/30/24 1424  TempSrc:   PainSc: Asleep         Complications: No notable events documented.

## 2024-09-30 NOTE — Discharge Instructions (Signed)
 No Tylenol  before 6pm   Post Anesthesia Home Care Instructions  Activity: Get plenty of rest for the remainder of the day. A responsible individual must stay with you for 24 hours following the procedure.  For the next 24 hours, DO NOT: -Drive a car -Advertising copywriter -Drink alcoholic beverages -Take any medication unless instructed by your physician -Make any legal decisions or sign important papers.  Meals: Start with liquid foods such as gelatin or soup. Progress to regular foods as tolerated. Avoid greasy, spicy, heavy foods. If nausea and/or vomiting occur, drink only clear liquids until the nausea and/or vomiting subsides. Call your physician if vomiting continues.  Special Instructions/Symptoms: Your throat may feel dry or sore from the anesthesia or the breathing tube placed in your throat during surgery. If this causes discomfort, gargle with warm salt water. The discomfort should disappear within 24 hours.  If you had a scopolamine patch placed behind your ear for the management of post- operative nausea and/or vomiting:  1. The medication in the patch is effective for 72 hours, after which it should be removed.  Wrap patch in a tissue and discard in the trash. Wash hands thoroughly with soap and water. 2. You may remove the patch earlier than 72 hours if you experience unpleasant side effects which may include dry mouth, dizziness or visual disturbances. 3. Avoid touching the patch. Wash your hands with soap and water after contact with the patch.

## 2024-09-30 NOTE — Anesthesia Procedure Notes (Signed)
 Procedure Name: LMA Insertion Date/Time: 09/30/2024 12:57 PM  Performed by: Emilio Rock BIRCH, CRNAPre-anesthesia Checklist: Patient identified, Emergency Drugs available, Suction available and Patient being monitored Patient Re-evaluated:Patient Re-evaluated prior to induction Oxygen Delivery Method: Circle system utilized Preoxygenation: Pre-oxygenation with 100% oxygen Induction Type: IV induction Ventilation: Mask ventilation without difficulty LMA: LMA flexible inserted LMA Size: 4.0 Number of attempts: 1 Airway Equipment and Method: Bite block Placement Confirmation: positive ETCO2 Tube secured with: Tape Dental Injury: Teeth and Oropharynx as per pre-operative assessment

## 2024-10-01 ENCOUNTER — Encounter (HOSPITAL_BASED_OUTPATIENT_CLINIC_OR_DEPARTMENT_OTHER): Payer: Self-pay | Admitting: Plastic Surgery

## 2024-10-01 NOTE — Anesthesia Postprocedure Evaluation (Signed)
 Anesthesia Post Note  Patient: Destiny Tucker  Procedure(s) Performed: REPAIR, BLEPHAROPTOSIS (Bilateral: Eye)     Patient location during evaluation: PACU Anesthesia Type: General Level of consciousness: awake and alert Pain management: pain level controlled Vital Signs Assessment: post-procedure vital signs reviewed and stable Respiratory status: spontaneous breathing, nonlabored ventilation, respiratory function stable and patient connected to nasal cannula oxygen Cardiovascular status: blood pressure returned to baseline and stable Postop Assessment: no apparent nausea or vomiting Anesthetic complications: no   No notable events documented.  Last Vitals:  Vitals:   09/30/24 1515 09/30/24 1531  BP: 137/71 (!) 138/46  Pulse: 93 94  Resp: 15 14  Temp:  (!) 36.4 C  SpO2: 93% 97%    Last Pain:  Vitals:   09/30/24 1531  TempSrc:   PainSc: 4                  Destiny Tucker

## 2024-12-01 ENCOUNTER — Encounter: Payer: Self-pay | Admitting: *Deleted

## 2024-12-05 ENCOUNTER — Ambulatory Visit: Admitting: Surgical

## 2024-12-05 ENCOUNTER — Encounter: Payer: Self-pay | Admitting: Surgical

## 2024-12-05 DIAGNOSIS — M170 Bilateral primary osteoarthritis of knee: Secondary | ICD-10-CM

## 2024-12-05 DIAGNOSIS — M1712 Unilateral primary osteoarthritis, left knee: Secondary | ICD-10-CM

## 2024-12-05 DIAGNOSIS — R200 Anesthesia of skin: Secondary | ICD-10-CM

## 2024-12-05 MED ORDER — LIDOCAINE HCL 1 % IJ SOLN
5.0000 mL | INTRAMUSCULAR | Status: AC | PRN
  Administered 2024-12-05: 5 mL

## 2024-12-05 MED ORDER — BUPIVACAINE HCL 0.25 % IJ SOLN
4.0000 mL | INTRAMUSCULAR | Status: AC | PRN
  Administered 2024-12-05: 4 mL via INTRA_ARTICULAR

## 2024-12-05 MED ORDER — METHYLPREDNISOLONE ACETATE 40 MG/ML IJ SUSP
40.0000 mg | INTRAMUSCULAR | Status: AC | PRN
  Administered 2024-12-05: 40 mg via INTRA_ARTICULAR

## 2024-12-05 NOTE — Progress Notes (Signed)
 "  Office Visit Note   Patient: Destiny Tucker           Date of Birth: 16-Apr-1956           MRN: [id number] Visit Date: 12/05/2024 Requested by: Vernon Velna SAUNDERS, MD [address] [place],  [place] [address] PCP: Vernon Velna SAUNDERS, MD  Subjective: Chief Complaint  Patient presents with   Left Knee - Pain    HPI: Destiny Tucker is a 69 y.o. female who presents to the office reporting knee pain.  Has history of knee arthritis.  No new falls or injuries.  No fevers or chills.  Previous injections have provided good relief and they are here today to repeat injections.  Last injection was Toradol  injection in May 2025.  Would like to have cortisone injection today.  She also describes numbness and tingling and burning pain in her right hand through all the fingers of the right hand.  Mostly on the palmar aspect of the right hand.  She wakes up at night with symptoms a lot and has to shake her hand in order to regain sensation.  She has history of nerve conduction study demonstrating severe carpal tunnel syndrome with last nerve conduction study done on 04/28/2024..                ROS: All systems reviewed are negative as they relate to the chief complaint within the history of present illness.  Patient denies fevers or chills.  Assessment & Plan: Visit Diagnoses:  1. Primary osteoarthritis of both knees   2. Numbness of right hand     Plan: Impression is 69 year old female who presents for evaluation of left knee pain.  Has history of left knee arthritis.  Would like to have cortisone injection today.  Tolerated injection well without complication.  She also has history of right carpal tunnel syndrome with nerve conduction study from 04/28/2024 demonstrating moderate to severe compression.  She is interested in right carpal tunnel release in the near future but has unpredictable schedule since her husband is disabled and she has to help care for him.  She will consider her  schedule and her options and reach out when she is ready to schedule surgery.  We did discuss carpal tunnel release in the area as well as the risks and benefits and the expected recovery timeframe.  Follow-Up Instructions: No follow-ups on file.   Orders:  No orders of the defined types were placed in this encounter.  No orders of the defined types were placed in this encounter.     Procedures: Large Joint Inj: L knee on 12/05/2024 1:56 PM Indications: diagnostic evaluation, joint swelling and pain Details: 18 G 1.5 in needle, superolateral approach  Arthrogram: No  Medications: 5 mL lidocaine  1 %; 4 mL bupivacaine  0.25 %; 40 mg methylPREDNISolone  acetate 40 MG/ML Outcome: tolerated well, no immediate complications Procedure, treatment alternatives, risks and benefits explained, specific risks discussed. Consent was given by the patient. Immediately prior to procedure a time out was called to verify the correct patient, procedure, equipment, support staff and site/side marked as required. Patient was prepped and draped in the usual sterile fashion.       Clinical Data: No additional findings.  Objective: Vital Signs: There were no vitals taken for this visit.  Physical Exam:  Constitutional: Patient appears well-developed HEENT:  Head: Normocephalic Eyes:EOM are normal Neck: Normal range of motion Cardiovascular: Normal rate Pulmonary/chest: Effort normal Neurologic: Patient is  alert Skin: Skin is warm Psychiatric: Patient has normal mood and affect  Ortho Exam: Ortho exam demonstrates knees without cellulitis or skin changes.  Effusion minimal in left knee.  No calf tenderness.  Negative Homans' sign.  No pain with hip range of motion.  Able to perform straight leg raise with both lower extremities.  Lower extremities warm and well-perfused.  Has about 3 degrees extension and 105 degrees of knee flexion  Positive Tinel sign over the carpal tunnel.  Intact EPL, FPL,  finger abduction of the right hand.  2+ radial pulse of the right upper extremity.  No incision noted in the right hand.  Specialty Comments:  No specialty comments available.  Imaging: No results found.   PMFS History: Patient Active Problem List   Diagnosis Date Noted   Closed fracture of left distal radius 08/01/2022   Arthritis 01/21/2020   Osteopenia 01/21/2020   Abnormal cervical Papanicolaou smear 12/22/2019   Chronic interstitial cystitis 12/22/2019   Genital herpes simplex 12/22/2019   Actinic keratosis 09/04/2019   Diffuse photodamage of skin 09/04/2019   History of nonmelanoma skin cancer 09/04/2019   Routine adult health maintenance 05/21/2017   Obesity 12/22/2016   Degenerative arthritis of knee 10/25/2016   Right knee pain 05/09/2016   Chronic pain of both knees 03/16/2016   Trochanteric bursitis of right hip 01/28/2016   Acute sinusitis 09/28/2015   Carpal tunnel syndrome 09/28/2015   Pre-employment health screening examination 05/26/2015   Muscle spasms of neck 03/09/2015   Bilateral lower extremity edema 03/09/2015   Degenerative arthritis of left knee 01/26/2015   Pronation deformity of both feet 01/26/2015   Bladder problem 08/02/2014   Type 2 diabetes mellitus (HCC) 08/30/2010   Hyperlipidemia 05/18/2010   Depression 05/18/2010   Hypertensive disorder 05/18/2010   Allergic rhinitis 05/18/2010   TOBACCO USE, QUIT 05/18/2010   HYSTEROSCOPY, HX OF 05/18/2010   Anxiety 10/26/2008   Hemorrhoids, internal 10/26/2008   Disorder of bone and cartilage 10/26/2008   Past Medical History:  Diagnosis Date   Allergic rhinitis    Carpal tunnel syndrome    GAD (generalized anxiety disorder)    Genital herpes    Hemorrhoids    History of low-risk melanoma    per pt excision rectal/ perineal area (moh's per pt) yrs ago   History of SCC (squamous cell carcinoma) of skin 2010   Hyperlipidemia    Hypertension    Internal hemorrhoids without mention of  complication    Left wrist fracture 07/2022   MDD (major depressive disorder)    OA (osteoarthritis)    Osteoporosis    Type 2 diabetes mellitus treated with insulin  [facility])    endocrinologist-- jamie huffman np;   checks blood sugar w/ Herlene,  (07-28-2022 pt stated fasting blood sugar 130--140)    Family History  Problem Relation Age of Onset   Dementia Mother    Alzheimer's disease Mother    Nephrolithiasis Father    Hypertension Father    Hyperlipidemia Father    Stroke Father    Aortic aneurysm Father    Cancer Sister    Alcoholism Sister    Hypertension Sister    Osteoarthritis Sister    Hypertension Brother    Kidney failure Brother    Diabetes Maternal Uncle     Past Surgical History:  Procedure Laterality Date   LAPAROSCOPIC SALPINGO OOPHERECTOMY Left 1983   LAPAROSCOPIC SALPINGOOPHERECTOMY Right 09/21/2004   @[facility]   by dr curlene;   EXTENSIVE LYSIS ADHESIONS AND  D&C HYSTEROSCOPY   MOHS SURGERY     per pt yrs ago rectal / perineal area   ORIF WRIST FRACTURE Left 08/01/2022   Procedure: OPEN REDUCTION INTERNAL FIXATION (ORIF) WRIST FRACTURE;  Surgeon: Beverley Evalene BIRCH, MD;  Location: [facility];  Service: Orthopedics;  Laterality: Left;   PTOSIS REPAIR Bilateral 09/30/2024   Procedure: REPAIR, BLEPHAROPTOSIS;  Surgeon: Arelia Filippo, MD;  Location: [facility];  Service: Plastics;  Laterality: Bilateral;  *BILATERAL PTOSIS REPAIR EXTERNAL APPROACH SUTURE TECHNIQUE**   Social History   Occupational History   Occupation: Retired    Associate Professor: [employer]  Tobacco Use   Smoking status: Former    Current packs/day: 0.00    Types: Cigarettes    Start date: 1984    Quit date: 2009    Years since quitting: 17.0    Passive exposure: Past   Smokeless tobacco: Never  Vaping Use   Vaping status: Never Used  Substance and Sexual Activity   Alcohol use: Not Currently    Comment: rare   Drug use: Never   Sexual activity: Not  Currently    Birth control/protection: Post-menopausal         "
